# Patient Record
Sex: Male | Born: 1980 | Race: Black or African American | Hispanic: No | Marital: Married | Smoking: Current every day smoker
Health system: Southern US, Community
[De-identification: ages and names within clinical notes are randomized; demographics above are authoritative.]

## PROBLEM LIST (undated history)

## (undated) DIAGNOSIS — I10 Essential (primary) hypertension: Secondary | ICD-10-CM

## (undated) DIAGNOSIS — E119 Type 2 diabetes mellitus without complications: Secondary | ICD-10-CM

## (undated) DIAGNOSIS — K5792 Diverticulitis of intestine, part unspecified, without perforation or abscess without bleeding: Secondary | ICD-10-CM

## (undated) DIAGNOSIS — J40 Bronchitis, not specified as acute or chronic: Secondary | ICD-10-CM

---

## 2014-06-11 ENCOUNTER — Encounter (HOSPITAL_BASED_OUTPATIENT_CLINIC_OR_DEPARTMENT_OTHER): Payer: Self-pay | Admitting: *Deleted

## 2014-06-11 ENCOUNTER — Emergency Department (HOSPITAL_BASED_OUTPATIENT_CLINIC_OR_DEPARTMENT_OTHER)
Admission: EM | Admit: 2014-06-11 | Discharge: 2014-06-11 | Disposition: A | Discharge status: Home / Self Care | Payer: BLUE CROSS/BLUE SHIELD | Attending: Emergency Medicine | Admitting: Emergency Medicine

## 2014-06-11 DIAGNOSIS — M79675 Pain in left toe(s): Secondary | ICD-10-CM | POA: Diagnosis not present

## 2014-06-11 DIAGNOSIS — B070 Plantar wart: Secondary | ICD-10-CM | POA: Insufficient documentation

## 2014-06-11 DIAGNOSIS — Z720 Tobacco use: Secondary | ICD-10-CM | POA: Diagnosis not present

## 2014-06-11 NOTE — ED Notes (Signed)
C/o pain to left smallest toe. Toe has calloused area on inner side. Pt states he does a lot of walking at work.

## 2014-06-11 NOTE — Discharge Instructions (Signed)
As discussed, your evaluation today has been largely reassuring.  But, it is important that you monitor your condition carefully, and do not hesitate to return to the ED if you develop new, or concerning changes in your condition.  As discussed, please obtain either a callus pad or cotton padding device to separate the toes, during the next few days at work.  Otherwise, please follow-up with a podiatrist for appropriate ongoing care.

## 2014-06-11 NOTE — ED Provider Notes (Signed)
CSN: [id number]     Arrival date & time 06/11/14  1035 History   First MD Initiated Contact with Patient 06/11/14 1107     Chief Complaint  Patient presents with  . Toe Pain     (Consider location/radiation/quality/duration/timing/severity/associated sxs/prior Treatment) HPI Patient presents with concern of left small toe pain.  Pain has been present for about 1 week, seemingly worse while working. Patient walks for approximately 12 hours during his work day. He notes that he now has a tender area on the medial aspect of his left small toe. No loss of sensation or strength in the foot, nor any other focal changes from baseline.  History reviewed. No pertinent past medical history. History reviewed. No pertinent past surgical history. No family history on file. History  Substance Use Topics  . Smoking status: Current Every Day Smoker -- 0.30 packs/day    Types: Cigarettes  . Smokeless tobacco: Not on file  . Alcohol Use: Not on file    Review of Systems  Constitutional: Negative for fever and chills.  Skin: Positive for color change.  Neurological: Negative for weakness.      Allergies  Review of patient's allergies indicates no known allergies.  Home Medications   Prior to Admission medications   Not on File   BP 139/82 mmHg  Pulse 72  Temp(Src) 98 F (36.7 C) (Oral)  Resp 20  Ht 6\' 2"  (1.88 m)  Wt 290 lb (131.543 kg)  BMI 37.22 kg/m2  SpO2 97% Physical Exam  Constitutional: He appears well-nourished. No distress.  HENT:  Head: Atraumatic.  Cardiovascular: Normal rate, regular rhythm and intact distal pulses.   Musculoskeletal:       Feet:  Skin: He is not diaphoretic.  Nursing note and vitals reviewed.   ED Course  Procedures (including critical care time)   MDM   Final diagnoses:  Pain of toe of left foot  patient presents with foot pain, and has a sclerotic area on the medial aspect of his left foot. No history of trauma, no history of gout,  no history of falling. Physical exam suggests callus versus plantar wart. Patient is appropriate for further evaluation, management as an outpatient.    Gerhard Munchobert , MD 06/11/14 [phone]

## 2015-10-20 DIAGNOSIS — K5792 Diverticulitis of intestine, part unspecified, without perforation or abscess without bleeding: Secondary | ICD-10-CM

## 2015-10-20 HISTORY — DX: Diverticulitis of intestine, part unspecified, without perforation or abscess without bleeding: K57.92

## 2015-12-16 ENCOUNTER — Emergency Department (HOSPITAL_BASED_OUTPATIENT_CLINIC_OR_DEPARTMENT_OTHER)
Admission: EM | Admit: 2015-12-16 | Discharge: 2015-12-16 | Disposition: A | Discharge status: Home / Self Care | Payer: BLUE CROSS/BLUE SHIELD | Attending: Emergency Medicine | Admitting: Emergency Medicine

## 2015-12-16 ENCOUNTER — Emergency Department (HOSPITAL_BASED_OUTPATIENT_CLINIC_OR_DEPARTMENT_OTHER): Payer: BLUE CROSS/BLUE SHIELD

## 2015-12-16 ENCOUNTER — Encounter (HOSPITAL_BASED_OUTPATIENT_CLINIC_OR_DEPARTMENT_OTHER): Payer: Self-pay | Admitting: *Deleted

## 2015-12-16 DIAGNOSIS — M25562 Pain in left knee: Secondary | ICD-10-CM | POA: Diagnosis present

## 2015-12-16 DIAGNOSIS — F17210 Nicotine dependence, cigarettes, uncomplicated: Secondary | ICD-10-CM | POA: Diagnosis not present

## 2015-12-16 HISTORY — DX: Diverticulitis of intestine, part unspecified, without perforation or abscess without bleeding: K57.92

## 2015-12-16 MED ORDER — IBUPROFEN 400 MG PO TABS
600.0000 mg | ORAL_TABLET | Freq: Once | ORAL | Status: AC
  Administered 2015-12-16: 600 mg via ORAL
  Filled 2015-12-16: qty 1

## 2015-12-16 MED ORDER — OXYCODONE-ACETAMINOPHEN 5-325 MG PO TABS
1.0000 | ORAL_TABLET | Freq: Once | ORAL | Status: DC
Start: 2015-12-16 — End: 2015-12-16

## 2015-12-16 MED ORDER — IBUPROFEN 600 MG PO TABS: 600.0000 mg | ORAL_TABLET | Freq: Three times a day (TID) | ORAL | 0 refills | Status: DC | PRN

## 2015-12-16 MED ORDER — OXYCODONE-ACETAMINOPHEN 5-325 MG PO TABS
2.0000 | ORAL_TABLET | Freq: Once | ORAL | Status: DC
  Filled 2015-12-16: qty 2

## 2015-12-16 MED ORDER — OXYCODONE-ACETAMINOPHEN 5-325 MG PO TABS
1.0000 | ORAL_TABLET | Freq: Once | ORAL | Status: AC
  Administered 2015-12-16: 1 via ORAL

## 2015-12-16 NOTE — ED Triage Notes (Signed)
C/o L knee pain (midline and medial), onset at ~2030, no known injury, no meds PTA, no relief with elevation ice or rubbing with alcohol. Rates 8/10. Denies h/o same. Mentions was coming down the stairs today at work and stumbled. [name]. No ortho MD. Recent diverticulitis, finished doxy and flagyl ~ 2 weeks ago. (mentions some swelling. (denies: numbness/ tingling, nvd, fever or other sx), CMS intact, ROM limited d/t pain, swelling noted, no obvious heat. Pt making himself a tunafish sandwich at this time. VSS. Alert, NAD, calm, guarding knee, and movements. Able to bare wt and self transfer from w/c to stretcher.

## 2015-12-16 NOTE — ED Provider Notes (Signed)
[facility]   CSN: [id number] Arrival date & time: 12/16/15  0134     History   Chief Complaint Chief Complaint  Patient presents with  . Knee Pain    HPI Hooper Mario [name] is a 35 y.o. male. Patient presents to the emergency department with complaints of pain that began in his right knee.  Reports his pain began today.  She's not tried any medications prior to arrival.  He currently rates his pain is 8 out of 10.  His) knee pain like this before.  No history of gout.  No injury or trauma.  He is not remember any specific injury but he was at work when he began having worsening knee pain.  Denies fevers and chills.   The history is provided by the patient.    Past Medical History:  Diagnosis Date  . Diverticulitis 10/2015    There are no active problems to display for this patient.   History reviewed. No pertinent surgical history.     Home Medications    Prior to Admission medications   Medication Sig Start Date End Date Taking? Authorizing Provider  ibuprofen (ADVIL,MOTRIN) 600 MG tablet Take 1 tablet (600 mg total) by mouth every 8 (eight) hours as needed. 12/16/15   Azalia Bilis, MD    Family History History reviewed. No pertinent family history.  Social History Social History  Substance Use Topics  . Smoking status: Current Every Day Smoker    Packs/day: 0.30    Types: Cigarettes  . Smokeless tobacco: Never Used  . Alcohol use Yes     Comment: occ     Allergies   Review of patient's allergies indicates no known allergies.   Review of Systems Review of Systems  All other systems reviewed and are negative.    Physical Exam Updated Vital Signs BP 145/91 (BP Location: Right Arm)   Pulse 81   Temp 98.4 F (36.9 C) (Oral)   Resp 20   Ht 6\' 1"  (1.854 m)   Wt 279 lb (126.6 kg)   SpO2 97%   BMI 36.81 kg/m   Physical Exam  Constitutional: He is oriented to person, place, and time. He appears well-developed and  well-nourished.  HENT:  Head: Normocephalic.  Eyes: EOM are normal.  Neck: Normal range of motion.  Pulmonary/Chest: Effort normal.  Abdominal: He exhibits no distension.  Musculoskeletal:  Mild generalized tenderness of the left knee without significant swelling.  No erythema noted.  Normal pulses in left foot.  Full range of motion of left knee and left ankle as well as left hip.  No swelling of left lower extremity as compared to the right.  Neurological: He is alert and oriented to person, place, and time.  Psychiatric: He has a normal mood and affect.  Nursing note and vitals reviewed.    ED Treatments / Results  Labs (all labs ordered are listed, but only abnormal results are displayed) Labs Reviewed - No data to display  EKG  EKG Interpretation None       Radiology Dg Knee Complete 4 Views Left  Result Date: 12/16/2015 CLINICAL DATA:  Medial and midline left knee pain. No known injury. Stumbled on stairs today. EXAM: LEFT KNEE - COMPLETE 4+ VIEW COMPARISON:  None. FINDINGS: No evidence of fracture, dislocation, or joint effusion. No evidence of arthropathy or other focal bone abnormality. Soft tissues are unremarkable. IMPRESSION: Negative. Electronically Signed   By: Burman Nieves M.D.   On: 12/16/2015 02:23  Procedures Procedures (including critical care time)  Medications Ordered in ED Medications  ibuprofen (ADVIL,MOTRIN) tablet 600 mg (600 mg Oral Given 12/16/15 0243)  oxyCODONE-acetaminophen (PERCOCET/ROXICET) 5-325 MG per tablet 1 tablet (1 tablet Oral Given 12/16/15 0243)     Initial Impression / Assessment and Plan / ED Course  I have reviewed the triage vital signs and the nursing notes.  Pertinent labs & imaging results that were available during my care of the patient were reviewed by me and considered in my medical decision making (see chart for details).  Clinical Course    Plain films without acute pathology.  No evidence of significant  arthritis.  This could represent an inflammatory arthritis.  Doubt septic arthritis.  Well-appearing.  Ambulatory in the emergency department.  Home with ibuprofen.  Outpatient sports medicine follow-up with his knee pain continues.  Final Clinical Impressions(s) / ED Diagnoses   Final diagnoses:  Left knee pain    New Prescriptions New Prescriptions   IBUPROFEN (ADVIL,MOTRIN) 600 MG TABLET    Take 1 tablet (600 mg total) by mouth every 8 (eight) hours as needed.     Azalia BilisKevin , MD 12/16/15 [phone]

## 2019-01-09 ENCOUNTER — Encounter (HOSPITAL_BASED_OUTPATIENT_CLINIC_OR_DEPARTMENT_OTHER): Payer: Self-pay | Admitting: *Deleted

## 2019-01-09 ENCOUNTER — Emergency Department (HOSPITAL_BASED_OUTPATIENT_CLINIC_OR_DEPARTMENT_OTHER): Payer: BC Managed Care – PPO

## 2019-01-09 ENCOUNTER — Other Ambulatory Visit: Payer: Self-pay

## 2019-01-09 ENCOUNTER — Emergency Department (HOSPITAL_BASED_OUTPATIENT_CLINIC_OR_DEPARTMENT_OTHER)
Admission: EM | Admit: 2019-01-09 | Discharge: 2019-01-09 | Disposition: A | Discharge status: Home / Self Care | Payer: BC Managed Care – PPO | Attending: Emergency Medicine | Admitting: Emergency Medicine

## 2019-01-09 DIAGNOSIS — M899 Disorder of bone, unspecified: Secondary | ICD-10-CM | POA: Insufficient documentation

## 2019-01-09 DIAGNOSIS — F17210 Nicotine dependence, cigarettes, uncomplicated: Secondary | ICD-10-CM | POA: Diagnosis not present

## 2019-01-09 DIAGNOSIS — M79672 Pain in left foot: Secondary | ICD-10-CM

## 2019-01-09 MED ORDER — IBUPROFEN 400 MG PO TABS
600.0000 mg | ORAL_TABLET | Freq: Once | ORAL | Status: AC
  Administered 2019-01-09: 600 mg via ORAL
  Filled 2019-01-09: qty 1

## 2019-01-09 NOTE — Discharge Instructions (Addendum)
It was our pleasure to provide your ER care today - we hope that you feel better.  Take ibuprofen or aleve as need for pain.   For bone lesion noted on xrays, and for your foot pain, follow up with orthopedist in the next 1-2 weeks - call office tomorrow to arrange appointment.   Also follow up with primary care doctor in the next few weeks for recheck of blood pressure, as it is high tonight.   Return to ER if worse, new symptoms, fevers, increased swelling/redness of foot, severe or intractable pain, or other concern.

## 2019-01-09 NOTE — ED Provider Notes (Signed)
[facility] EMERGENCY DEPARTMENT Provider Note   CSN: [id number] Arrival date & time: 01/09/19  2001     History   Chief Complaint Chief Complaint  Patient presents with  . Foot Pain    HPI Mario [name] is a 38 y.o. male.     Patient c/o left foot pain for the past few days. Symptoms acute onset, moderate, persistent, non radiating, worse w wt bearing/walking. Initially was located on dorsal aspect of midfoot, but now also just anterior to left heel. Family member notes similar episodes of pain in that foot on a recurrent basis 'for a long time'. Pt denies specific injury. No foot numbness/weakness. No skin lesions or erythema. No hx arthritis or gout. No leg swelling, no calf swelling or pain. No fever or chills.   The history is provided by the patient.  Foot Pain    Past Medical History:  Diagnosis Date  . Diverticulitis 10/2015    There are no active problems to display for this patient.   History reviewed. No pertinent surgical history.      Home Medications    Prior to Admission medications   Medication Sig Start Date End Date Taking? Authorizing Provider  ibuprofen (ADVIL,MOTRIN) 600 MG tablet Take 1 tablet (600 mg total) by mouth every 8 (eight) hours as needed. 12/16/15   Jola Schmidt, MD    Family History No family history on file.  Social History Social History   Tobacco Use  . Smoking status: Current Every Day Smoker    Packs/day: 0.30    Types: Cigarettes  . Smokeless tobacco: Never Used  Substance Use Topics  . Alcohol use: Yes    Comment: occ  . Drug use: No     Allergies   Patient has no known allergies.   Review of Systems Review of Systems  Constitutional: Negative for chills and fever.  Cardiovascular: Negative for leg swelling.  Musculoskeletal:       Left foot pain  Skin: Negative for rash and wound.  Neurological: Negative for numbness.     Physical Exam Updated Vital Signs BP (!) 136/99 (BP Location:  Right Arm)   Pulse 80   Temp 99 F (37.2 C) (Oral)   Resp 18   Ht 1.88 m (6\' 2" )   Wt (!) 138.3 kg   SpO2 100%   BMI 39.16 kg/m   Physical Exam Vitals signs and nursing note reviewed.  Constitutional:      Appearance: Normal appearance. He is well-developed.  HENT:     Head: Atraumatic.     Nose: Nose normal.     Mouth/Throat:     Mouth: Mucous membranes are moist.  Eyes:     General: No scleral icterus.    Conjunctiva/sclera: Conjunctivae normal.  Neck:     Musculoskeletal: Neck supple.     Trachea: No tracheal deviation.  Cardiovascular:     Rate and Rhythm: Normal rate.     Pulses: Normal pulses.  Pulmonary:     Effort: Pulmonary effort is normal. No accessory muscle usage or respiratory distress.  Genitourinary:    Comments: No cva tenderness. Musculoskeletal:        General: No swelling.     Comments: Tenderness dorsal aspect left midfoot, and plantar aspect anterior to left heel. Dp/pt 2+. Normal cap refill distally. Skin intact, no erythema or lesions. Foot is of normal color and warmth. Good passive rom at ankle without pain, ankle stable. No leg/calf swelling or pain.   Skin:  General: Skin is warm and dry.     Findings: No rash.  Neurological:     Mental Status: He is alert.     Comments: Alert, speech clear. Left foot, motor/sens grossly intact.   Psychiatric:        Mood and Affect: Mood normal.      ED Treatments / Results  Labs (all labs ordered are listed, but only abnormal results are displayed) Labs Reviewed - No data to display  EKG None  Radiology Dg Ankle Complete Left  Result Date: 01/09/2019 CLINICAL DATA:  Pain EXAM: LEFT ANKLE COMPLETE - 3+ VIEW COMPARISON:  None. FINDINGS: No fracture or dislocation. The ankle mortise appears congruent. 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 Enthesophyte at the syndesmosis. No large ankle joint effusion. Calcaneal enthesophytes seen. 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 IMPRESSION: No acute osseous abnormality. Electronically Signed   By: Jonna ClarkBindu  Avutu M.D.   On: 01/09/2019 22:10   Dg Foot Complete Left  Result Date: 01/09/2019 CLINICAL DATA:  Acute left foot pain. EXAM: LEFT FOOT - COMPLETE 3+ VIEW COMPARISON:  None. FINDINGS: No acute fracture or dislocation. Incompletely characterized lucent bone lesion within the distal tibia with thin sclerotic margins measuring approximately 4.5 x 3.8 cm, and appears to extend to the articular surface of the distal tibia. There is advanced joint space loss of the tibiotalar joint. Bidirectional calcaneal enthesophytes. No focal soft tissue swelling. IMPRESSION: 1. No acute osseous abnormality of the left foot. 2. 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 Further evaluation with dedicated radiographs of the left ankle is recommended. Electronically Signed   By: Duanne GuessNicholas  Plundo M.D.   On: 01/09/2019 20:59    Procedures Procedures (including critical care time)  Medications Ordered in ED Medications  ibuprofen (ADVIL) tablet 600 mg (has no administration in time range)     Initial Impression / Assessment and Plan / ED Course  I have reviewed the triage vital signs and the nursing notes.  Pertinent labs & imaging results that were available during my care of the patient were reviewed by me and considered in my medical decision making (see chart for details).  Xrays, motrin po.  Reviewed nursing notes and prior charts for additional history.   Xrays reviewed by me - no fx. Note by by radiology of possible distal tibia/articular bone cyst/lesion, and rec ankle xrays - discussed w pt, and ankle films added.   Will have f/u with orthopedics as outpt.   Discussed plan for nsaid, and ortho f/u.   Return  precautions provided.     Final Clinical Impressions(s) / ED Diagnoses   Final diagnoses:  None    ED Discharge Orders    None       Cathren LaineSteinl, , MD 01/09/19 2214

## 2019-01-09 NOTE — ED Triage Notes (Signed)
Pt reports pain in top of right foot since yesterday. States pain is moving back toward his ankle. Denies injury

## 2019-01-09 NOTE — ED Notes (Signed)
ED Provider at bedside. 

## 2019-05-28 ENCOUNTER — Emergency Department (HOSPITAL_BASED_OUTPATIENT_CLINIC_OR_DEPARTMENT_OTHER): Payer: BC Managed Care – PPO

## 2019-05-28 ENCOUNTER — Encounter (HOSPITAL_BASED_OUTPATIENT_CLINIC_OR_DEPARTMENT_OTHER): Payer: Self-pay | Admitting: *Deleted

## 2019-05-28 ENCOUNTER — Emergency Department (HOSPITAL_BASED_OUTPATIENT_CLINIC_OR_DEPARTMENT_OTHER)
Admission: EM | Admit: 2019-05-28 | Discharge: 2019-05-29 | Disposition: A | Discharge status: Home / Self Care | Payer: BC Managed Care – PPO | Attending: Emergency Medicine | Admitting: Emergency Medicine

## 2019-05-28 ENCOUNTER — Other Ambulatory Visit: Payer: Self-pay

## 2019-05-28 DIAGNOSIS — R1013 Epigastric pain: Secondary | ICD-10-CM | POA: Insufficient documentation

## 2019-05-28 DIAGNOSIS — B349 Viral infection, unspecified: Secondary | ICD-10-CM

## 2019-05-28 DIAGNOSIS — R519 Headache, unspecified: Secondary | ICD-10-CM | POA: Diagnosis not present

## 2019-05-28 DIAGNOSIS — F17210 Nicotine dependence, cigarettes, uncomplicated: Secondary | ICD-10-CM | POA: Insufficient documentation

## 2019-05-28 DIAGNOSIS — Z20822 Contact with and (suspected) exposure to covid-19: Secondary | ICD-10-CM | POA: Diagnosis not present

## 2019-05-28 LAB — COMPREHENSIVE METABOLIC PANEL
ALT: 35 U/L (ref 0–44)
AST: 29 U/L (ref 15–41)
Albumin: 4 g/dL (ref 3.5–5.0)
Alkaline Phosphatase: 84 U/L (ref 38–126)
Anion gap: 9 (ref 5–15)
BUN: <5 mg/dL — ABNORMAL LOW (ref 6–20)
CO2: 27 mmol/L (ref 22–32)
Calcium: 9.2 mg/dL (ref 8.9–10.3)
Chloride: 101 mmol/L (ref 98–111)
Creatinine, Ser: 1.05 mg/dL (ref 0.61–1.24)
GFR calc Af Amer: >60 mL/min (ref >60)
GFR calc non Af Amer: >60 mL/min (ref >60)
Glucose, Bld: 116 mg/dL — ABNORMAL HIGH (ref 70–99)
Potassium: 4.1 mmol/L (ref 3.5–5.1)
Sodium: 137 mmol/L (ref 135–145)
Total Bilirubin: 1 mg/dL (ref 0.3–1.2)
Total Protein: 7.9 g/dL (ref 6.5–8.1)

## 2019-05-28 LAB — CBC WITH DIFFERENTIAL/PLATELET
Abs Immature Granulocytes: 0.04 10*3/uL (ref 0.00–0.07)
Basophils Absolute: 0.1 10*3/uL (ref 0.0–0.1)
Basophils Relative: 1 %
Eosinophils Absolute: 0.1 10*3/uL (ref 0.0–0.5)
Eosinophils Relative: 1 %
HCT: 47.6 % (ref 39.0–52.0)
Hemoglobin: 16.1 g/dL (ref 13.0–17.0)
Immature Granulocytes: 0 %
Lymphocytes Relative: 28 %
Lymphs Abs: 2.8 10*3/uL (ref 0.7–4.0)
MCH: 30.8 pg (ref 26.0–34.0)
MCHC: 33.8 g/dL (ref 30.0–36.0)
MCV: 91 fL (ref 80.0–100.0)
Monocytes Absolute: 0.8 10*3/uL (ref 0.1–1.0)
Monocytes Relative: 8 %
Neutro Abs: 6.1 10*3/uL (ref 1.7–7.7)
Neutrophils Relative %: 62 %
Platelets: 323 10*3/uL (ref 150–400)
RBC: 5.23 MIL/uL (ref 4.22–5.81)
RDW: 14.6 % (ref 11.5–15.5)
WBC: 9.7 10*3/uL (ref 4.0–10.5)
nRBC: 0 % (ref 0.0–0.2)

## 2019-05-28 LAB — TROPONIN I (HIGH SENSITIVITY)
Troponin I (High Sensitivity): 11 ng/L (ref <18)
Troponin I (High Sensitivity): 11 ng/L (ref <18)

## 2019-05-28 LAB — LIPASE, BLOOD: Lipase: 21 U/L (ref 11–51)

## 2019-05-28 LAB — URINALYSIS, MICROSCOPIC (REFLEX)

## 2019-05-28 LAB — RAPID URINE DRUG SCREEN, HOSP PERFORMED
Amphetamines: NOT DETECTED
Barbiturates: NOT DETECTED
Benzodiazepines: NOT DETECTED
Cocaine: POSITIVE — AB
Opiates: NOT DETECTED
Tetrahydrocannabinol: NOT DETECTED

## 2019-05-28 LAB — URINALYSIS, ROUTINE W REFLEX MICROSCOPIC
Bilirubin Urine: NEGATIVE
Glucose, UA: NEGATIVE mg/dL
Ketones, ur: NEGATIVE mg/dL
Leukocytes,Ua: NEGATIVE
Nitrite: NEGATIVE
Protein, ur: NEGATIVE mg/dL
Specific Gravity, Urine: >1.03 — ABNORMAL HIGH (ref 1.005–1.030)
pH: 5.5 (ref 5.0–8.0)

## 2019-05-28 LAB — SARS CORONAVIRUS 2 AG (30 MIN TAT): SARS Coronavirus 2 Ag: NEGATIVE

## 2019-05-28 LAB — LACTIC ACID, PLASMA
Lactic Acid, Venous: 2.6 mmol/L (ref 0.5–1.9)
Lactic Acid, Venous: 2.9 mmol/L (ref 0.5–1.9)
Lactic Acid, Venous: 2.3 mmol/L (ref 0.5–1.9)

## 2019-05-28 MED ORDER — SODIUM CHLORIDE 0.9 % IV BOLUS
500.0000 mL | Freq: Once | INTRAVENOUS | Status: AC
  Administered 2019-05-28: 500 mL via INTRAVENOUS

## 2019-05-28 MED ORDER — SODIUM CHLORIDE 0.9 % IV BOLUS
1000.0000 mL | Freq: Once | INTRAVENOUS | Status: AC
  Administered 2019-05-28: 1000 mL via INTRAVENOUS

## 2019-05-28 MED ORDER — SODIUM CHLORIDE 0.9 % IV BOLUS: 500.0000 mL | Freq: Once | INTRAVENOUS | Status: DC

## 2019-05-28 MED ORDER — ONDANSETRON HCL 4 MG/2ML IJ SOLN
4.0000 mg | Freq: Once | INTRAMUSCULAR | Status: AC
  Administered 2019-05-28: 4 mg via INTRAVENOUS
  Filled 2019-05-28: qty 2

## 2019-05-28 NOTE — ED Notes (Signed)
Lab called a lactic acid of 2.6. Dr. Fredderick Phenix is aware of this result.

## 2019-05-28 NOTE — ED Provider Notes (Signed)
[facility] EMERGENCY DEPARTMENT Provider Note   CSN: [id number] Arrival date & time: 05/28/19  7408     History Chief Complaint  Patient presents with  . Headache  . Abdominal Pain    Mario [name] is a 39 y.o. male.  Patient is a 39 year old male who presents with chest and abdominal pain.  He states that he has been feeling weak since this morning.  Around noon he started having epigastric pain that radiated to his chest.  He has been having some coughing and nasal congestion.  He has some associated shortness of breath.  He had one episode of vomiting.  He feels like "he's going in and out".  He says he feels weak all over.  He also complains of a headache that is on the top of his head and toward the back.  He said that started about the same time as the abdominal and chest pain.  It started out mild and got worse over the day.  No known fevers.  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  He smokes marijuana but denies any other drug use.  He denies any alcohol use.        No past medical history on file.  There are no problems to display for this patient.   History reviewed. No pertinent surgical history.     No family history on file.  Social History   Tobacco Use  . Smoking status: Current Every Day Smoker  . Smokeless tobacco: Never Used  Substance Use Topics  . Alcohol use: Yes  . Drug use: Yes    Types: Cocaine    Comment: states last used 2 weeks ago    Home Medications Prior to Admission medications   Not on File    Allergies    Patient has no known allergies.  Review of Systems   Review of Systems  Constitutional: Positive for fatigue. Negative for chills, diaphoresis and fever.  HENT: Positive for congestion. Negative for rhinorrhea and sneezing.   Eyes: Negative.   Respiratory: Positive for cough and shortness of breath. Negative for chest tightness.   Cardiovascular: Positive for  chest pain. Negative for leg swelling.  Gastrointestinal: Positive for abdominal pain, nausea and vomiting. Negative for blood in stool and diarrhea.  Genitourinary: Negative for difficulty urinating, flank pain, frequency and hematuria.  Musculoskeletal: Negative for arthralgias and back pain.  Skin: Negative for rash.  Neurological: Positive for weakness (Diffuse) and headaches. Negative for dizziness, speech difficulty and numbness.    Physical Exam Updated Vital Signs BP 130/77   Pulse 76   Temp 97.9 F (36.6 C) (Oral)   Resp 15   Ht 6\' 2"  (1.88 m)   Wt 131.5 kg   SpO2 97%   BMI 37.23 kg/m   Physical Exam Constitutional:      Appearance: He is well-developed.     Comments: Patient is sleepy but will wake up and answer questions  HENT:     Head: Normocephalic and atraumatic.  Eyes:     Pupils: Pupils are equal, round, and reactive to light.  Cardiovascular:     Rate and Rhythm: Normal rate and regular rhythm.     Heart sounds: Normal heart sounds.  Pulmonary:     Effort: Pulmonary effort is normal. No respiratory distress.     Breath sounds: Normal breath sounds. No wheezing or rales.  Chest:  Chest wall: No tenderness.  Abdominal:     General: Bowel sounds are normal.     Palpations: Abdomen is soft.     Tenderness: There is no abdominal tenderness. There is no guarding or rebound.  Musculoskeletal:        General: Normal range of motion.     Cervical back: Normal range of motion and neck supple.  Lymphadenopathy:     Cervical: No cervical adenopathy.  Skin:    General: Skin is warm and dry.     Findings: No rash.  Neurological:     Mental Status: He is oriented to person, place, and time.     Cranial Nerves: No cranial nerve deficit, dysarthria or facial asymmetry.     Sensory: Sensory deficit present.     Motor: No weakness (No focal weakness).     ED Results / Procedures / Treatments   Labs (all labs ordered are listed, but only abnormal results  are displayed) Labs Reviewed  COMPREHENSIVE METABOLIC PANEL - Abnormal; Notable for the following components:      Result Value   Glucose, Bld 116 (*)    BUN <5 (*)    All other components within normal limits  LACTIC ACID, PLASMA - Abnormal; Notable for the following components:   Lactic Acid, Venous 2.6 (*)    All other components within normal limits  LACTIC ACID, PLASMA - Abnormal; Notable for the following components:   Lactic Acid, Venous 2.9 (*)    All other components within normal limits  RAPID URINE DRUG SCREEN, HOSP PERFORMED - Abnormal; Notable for the following components:   Cocaine POSITIVE (*)    All other components within normal limits  URINALYSIS, ROUTINE W REFLEX MICROSCOPIC - Abnormal; Notable for the following components:   Specific Gravity, Urine >1.030 (*)    Hgb urine dipstick MODERATE (*)    All other components within normal limits  URINALYSIS, MICROSCOPIC (REFLEX) - Abnormal; Notable for the following components:   Bacteria, UA MANY (*)    All other components within normal limits  SARS CORONAVIRUS 2 AG (30 MIN TAT)  SARS CORONAVIRUS 2 (TAT 6-24 HRS)  CBC WITH DIFFERENTIAL/PLATELET  LIPASE, BLOOD  TROPONIN I (HIGH SENSITIVITY)  TROPONIN I (HIGH SENSITIVITY)    EKG EKG Interpretation  Date/Time:  Saturday May 28 2019 19:18:04 EST Ventricular Rate:  63 PR Interval:    QRS Duration: 91 QT Interval:  482 QTC Calculation: 494 R Axis:   48 Text Interpretation: Sinus rhythm Minimal ST elevation, anterior leads Borderline prolonged QT interval NO STEMI. No old tracing to compare Confirmed by Drema Pry [phone]) on 05/28/2019 11:12:10 PM   Radiology CT Head Wo Contrast  Result Date: 05/28/2019 CLINICAL DATA:  Headache EXAM: CT HEAD WITHOUT CONTRAST TECHNIQUE: Contiguous axial images were obtained from the base of the skull through the vertex without intravenous contrast. COMPARISON:  None. FINDINGS: Brain: No acute intracranial abnormality.  Specifically, no hemorrhage, hydrocephalus, mass lesion, acute infarction, or significant intracranial injury. Vascular: No hyperdense vessel or unexpected calcification. Skull: No acute calvarial abnormality. Sinuses/Orbits: Visualized paranasal sinuses and mastoids clear. Orbital soft tissues unremarkable. Other: None IMPRESSION: Normal study. Electronically Signed   By: Charlett Nose M.D.   On: 05/28/2019 20:20   DG Chest Port 1 View  Result Date: 05/28/2019 CLINICAL DATA:  Cough EXAM: PORTABLE CHEST 1 VIEW COMPARISON:  05/24/2019 FINDINGS: The heart size and mediastinal contours are within normal limits. Both lungs are clear. The visualized skeletal structures are unremarkable. IMPRESSION:  No acute abnormality of the lungs in AP portable projection. Electronically Signed   By: Lauralyn Primes M.D.   On: 05/28/2019 20:20    Procedures Procedures (including critical care time)  Medications Ordered in ED Medications  sodium chloride 0.9 % bolus 500 mL (0 mLs Intravenous Stopping Infusion hung by another clincian 05/28/19 2136)  ondansetron (ZOFRAN) injection 4 mg (4 mg Intravenous Given 05/28/19 2048)  sodium chloride 0.9 % bolus 1,000 mL (1,000 mLs Intravenous New Bag/Given 05/28/19 2218)    ED Course  I have reviewed the triage vital signs and the nursing notes.  Pertinent labs & imaging results that were available during my care of the patient were reviewed by me and considered in my medical decision making (see chart for details).    MDM Rules/Calculators/A&P                      Patient is a 39 year old male who presents with 2-day history of some cough and cold symptoms associated with some chest and abdominal pain that started today associated with a headache.  He has no meningeal symptoms.  On my exam he has no abdominal tenderness.  He says is chest and abdominal pain resolved after he had an episode of vomiting prior to arrival at the ED.  He was initially fairly sleepy on exam but is  completely back to baseline at this point.  His labs are nonconcerning other than he does have an elevation in his lactate.  His urine appears to be concentrated.  It could be related to some dehydration.  He does not have myalgias which would be more concerning for rhabdo.  He has no abdominal pain on reexam.  No chest pain.  His headache and neck pain have resolved.  He is currently in the process of getting some IV fluids and will recheck his lactate following this.  If improving can be discharged home with symptomatic care.  Dr. Eudelia Bunch to take over pending lactate. Final Clinical Impression(s) / ED Diagnoses Final diagnoses:  Nonintractable headache, unspecified chronicity pattern, unspecified headache type  Viral syndrome    Rx / DC Orders ED Discharge Orders    None       Rolan Bucco, MD 05/28/19 2318

## 2019-05-28 NOTE — ED Notes (Signed)
RN @bedside  for labs; imaging tbd after

## 2019-05-28 NOTE — ED Triage Notes (Addendum)
Pt c/o headache, abdominal pain, and "feeling weak" since this morning. Reports he vomited on the way to the ED. Points to epigastric for location of pain. States "I feel like I'm going to pass out". Pt taken to room 11 to complete triage

## 2019-05-28 NOTE — ED Notes (Signed)
ED Provider at bedside. 

## 2019-05-29 LAB — SARS CORONAVIRUS 2 (TAT 6-24 HRS): SARS Coronavirus 2: NEGATIVE

## 2019-05-29 NOTE — ED Provider Notes (Signed)
I assumed care of this patient from Dr. Gilmore Laroche.  Please see their note for further details of Hx, PE.  Briefly patient is a 39 y.o. male who presented chest pain, abd pain, and headache. Work up reassuring other than elevated lactic acid. Plan to recheck lactic after IVF.  Down trending. Pain resolved. Stable.   The patient appears reasonably screened and/or stabilized for discharge and I doubt any other medical condition or other [facility] requiring further screening, evaluation, or treatment in the ED at this time prior to discharge.  Disposition: Discharge  Condition: Good  I have discussed the results, Dx and Tx plan with the patient who expressed understanding and agree(s) with the plan. Discharge instructions discussed at great length. The patient was given strict return precautions who verbalized understanding of the instructions. No further questions at time of discharge.    ED Discharge Orders    None     Follow Up: Practice, [facility] [address] [place] [address] [phone]  Schedule an appointment as soon as possible for a visit            , Amadeo Garnet, MD 05/29/19 [phone]

## 2019-05-30 ENCOUNTER — Encounter (HOSPITAL_BASED_OUTPATIENT_CLINIC_OR_DEPARTMENT_OTHER): Payer: Self-pay | Admitting: *Deleted

## 2020-03-12 ENCOUNTER — Emergency Department (HOSPITAL_BASED_OUTPATIENT_CLINIC_OR_DEPARTMENT_OTHER)
Admission: EM | Admit: 2020-03-12 | Discharge: 2020-03-12 | Disposition: A | Discharge status: Home / Self Care | Payer: BC Managed Care – PPO | Attending: Emergency Medicine | Admitting: Emergency Medicine

## 2020-03-12 ENCOUNTER — Other Ambulatory Visit: Payer: Self-pay

## 2020-03-12 ENCOUNTER — Encounter (HOSPITAL_BASED_OUTPATIENT_CLINIC_OR_DEPARTMENT_OTHER): Payer: Self-pay

## 2020-03-12 ENCOUNTER — Emergency Department (HOSPITAL_BASED_OUTPATIENT_CLINIC_OR_DEPARTMENT_OTHER): Payer: BC Managed Care – PPO

## 2020-03-12 DIAGNOSIS — F17210 Nicotine dependence, cigarettes, uncomplicated: Secondary | ICD-10-CM | POA: Insufficient documentation

## 2020-03-12 DIAGNOSIS — M25562 Pain in left knee: Secondary | ICD-10-CM | POA: Diagnosis not present

## 2020-03-12 DIAGNOSIS — M62838 Other muscle spasm: Secondary | ICD-10-CM | POA: Diagnosis not present

## 2020-03-12 DIAGNOSIS — R519 Headache, unspecified: Secondary | ICD-10-CM | POA: Diagnosis not present

## 2020-03-12 DIAGNOSIS — Z79899 Other long term (current) drug therapy: Secondary | ICD-10-CM | POA: Diagnosis not present

## 2020-03-12 DIAGNOSIS — M542 Cervicalgia: Secondary | ICD-10-CM | POA: Diagnosis present

## 2020-03-12 MED ORDER — IBUPROFEN 800 MG PO TABS
800.0000 mg | ORAL_TABLET | Freq: Once | ORAL | Status: AC
  Administered 2020-03-12: 800 mg via ORAL
  Filled 2020-03-12: qty 1

## 2020-03-12 MED ORDER — METHOCARBAMOL 500 MG PO TABS: 500.0000 mg | ORAL_TABLET | Freq: Two times a day (BID) | ORAL | 0 refills | Status: DC

## 2020-03-12 MED ORDER — IBUPROFEN 600 MG PO TABS: 600.0000 mg | ORAL_TABLET | Freq: Four times a day (QID) | ORAL | 0 refills | Status: DC | PRN
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
## 2020-07-31 ENCOUNTER — Encounter (HOSPITAL_BASED_OUTPATIENT_CLINIC_OR_DEPARTMENT_OTHER): Payer: Self-pay | Admitting: *Deleted

## 2020-07-31 ENCOUNTER — Emergency Department (HOSPITAL_BASED_OUTPATIENT_CLINIC_OR_DEPARTMENT_OTHER)
Admission: EM | Admit: 2020-07-31 | Discharge: 2020-08-01 | Disposition: A | Discharge status: Home / Self Care | Payer: BC Managed Care – PPO | Attending: Emergency Medicine | Admitting: Emergency Medicine

## 2020-07-31 ENCOUNTER — Emergency Department (HOSPITAL_BASED_OUTPATIENT_CLINIC_OR_DEPARTMENT_OTHER): Payer: BC Managed Care – PPO

## 2020-07-31 ENCOUNTER — Other Ambulatory Visit: Payer: Self-pay

## 2020-07-31 DIAGNOSIS — M25572 Pain in left ankle and joints of left foot: Secondary | ICD-10-CM | POA: Insufficient documentation

## 2020-07-31 DIAGNOSIS — F17210 Nicotine dependence, cigarettes, uncomplicated: Secondary | ICD-10-CM | POA: Diagnosis not present

## 2020-07-31 MED ORDER — KETOROLAC TROMETHAMINE 60 MG/2ML IM SOLN
30.0000 mg | Freq: Once | INTRAMUSCULAR | Status: AC
  Administered 2020-07-31: 30 mg via INTRAMUSCULAR
  Filled 2020-07-31: qty 2

## 2020-07-31 MED ORDER — ACETAMINOPHEN 500 MG PO TABS
1000.0000 mg | ORAL_TABLET | Freq: Once | ORAL | Status: AC
  Administered 2020-07-31: 1000 mg via ORAL
  Filled 2020-07-31: qty 2

## 2020-07-31 NOTE — ED Triage Notes (Signed)
C/o left leg pain swelling and redness x 1 week

## 2020-07-31 NOTE — ED Provider Notes (Signed)
[facility] EMERGENCY DEPARTMENT Provider Note  CSN: [id number] Arrival date & time: 07/31/20 2302  Chief Complaint(s) Leg Pain  HPI Mario [name] is a 40 y.o. male   The history is provided by the patient.  Leg Pain Location:  Ankle and leg Time since incident:  1 week Injury: no   Leg location:  R lower leg Ankle location:  L ankle Pain details:    Quality:  Aching and throbbing   Severity:  Severe   Onset quality:  Gradual   Duration:  1 week   Timing:  Constant   Progression:  Worsening Chronicity:  New Relieved by:  Nothing Worsened by:  Bearing weight and activity Associated symptoms: swelling   Associated symptoms: no back pain, no decreased ROM, no fever, no itching, no muscle weakness and no numbness     Past Medical History Past Medical History:  Diagnosis Date  . Diverticulitis 10/2015   There are no problems to display for this patient.  Home Medication(s) Prior to Admission medications   Medication Sig Start Date End Date Taking? Authorizing Provider  chlorthalidone (HYGROTON) 25 MG tablet Take by mouth. 07/31/20  Yes [provider]  traMADol (ULTRAM) 50 MG tablet Take by mouth. 07/31/20 08/05/20 Yes [provider]  amLODipine-benazepril (LOTREL) 5-20 MG capsule Take 1 capsule by mouth daily. 01/11/20   [provider]  fenofibrate (TRICOR) 145 MG tablet Take by mouth. 01/10/20   [provider]  ibuprofen (ADVIL) 600 MG tablet Take 1 tablet (600 mg total) by mouth every 6 (six) hours as needed. 03/12/20   Sponseller, Eugene Gavia, PA-C  methocarbamol (ROBAXIN) 500 MG tablet Take 1 tablet (500 mg total) by mouth 2 (two) times daily. 03/12/20   Sponseller, Lupe Carney R, PA-C  nadolol (CORGARD) 20 MG tablet Take 20 mg by mouth daily. 07/19/20   [provider]                                                                                                                                    Past Surgical  History History reviewed. No pertinent surgical history. Family History No family history on file.  Social History Social History   Tobacco Use  . Smoking status: Current Every Day Smoker    Packs/day: 0.30    Types: Cigarettes  . Smokeless tobacco: Never Used  Vaping Use  . Vaping Use: Never used  Substance Use Topics  . Alcohol use: Yes    Comment: occ  . Drug use: Not Currently    Types: Cocaine   Allergies Patient has no known allergies.  Review of Systems Review of Systems  Constitutional: Negative for fever.  Musculoskeletal: Negative for back pain.  Skin: Negative for itching.   All other systems are reviewed and are negative for acute change except as noted in the HPI  Physical Exam Vital Signs  I have reviewed the triage vital signs BP Marland Kitchen)  153/98   Pulse (!) 103   Temp 99.4 F (37.4 C) (Oral)   Resp 18   Ht 6\' 1"  (1.854 m)   Wt 136.1 kg   SpO2 98%   BMI 39.58 kg/m   Physical Exam Vitals reviewed.  Constitutional:      General: Mario [name] is not in acute distress.    Appearance: Mario [name] is well-developed. Mario [name] is not diaphoretic.  HENT:     Head: Normocephalic and atraumatic.     Jaw: No trismus.     Right Ear: External ear normal.     Left Ear: External ear normal.     Nose: Nose normal.  Eyes:     General: No scleral icterus.    Conjunctiva/sclera: Conjunctivae normal.  Neck:     Trachea: Phonation normal.  Cardiovascular:     Rate and Rhythm: Normal rate and regular rhythm.  Pulmonary:     Effort: Pulmonary effort is normal. No respiratory distress.     Breath sounds: No stridor.  Abdominal:     General: There is no distension.  Musculoskeletal:        General: Normal range of motion.     Cervical back: Normal range of motion.     Left lower leg: Swelling and tenderness present.     Right ankle: Normal pulse.     Left ankle: Swelling present. Tenderness present. Normal range of motion. Normal pulse.       Legs:  Neurological:     Mental  Status: Mario [name] is alert and oriented to person, place, and time.  Psychiatric:        Behavior: Behavior normal.     ED Results and Treatments Labs (all labs ordered are listed, but only abnormal results are displayed) Labs Reviewed - No data to display                                                                                                                       EKG  EKG Interpretation  Date/Time:    Ventricular Rate:    PR Interval:    QRS Duration:   QT Interval:    QTC Calculation:   R Axis:     Text Interpretation:        Radiology DG Ankle Complete Left  Result Date: 08/01/2020 CLINICAL DATA:  Left leg pain and swelling x1 week. EXAM: LEFT ANKLE COMPLETE - 3+ VIEW COMPARISON:  None. FINDINGS: There is no evidence of acute fracture, dislocation, or joint effusion. A small plantar calcaneal spur is seen. Chronic changes are seen involving the cortex of the lateral aspect of the distal left tibia and adjacent portion of the distal left fibula. Additional chronic changes are seen involving the left medial malleolus. Mild diffuse ankle soft tissue swelling is seen with mild soft tissue swelling also noted along the dorsal aspect of the proximal left foot. IMPRESSION: Mild soft tissue swelling and chronic changes, without acute osseous abnormality. Electronically Signed   By: 08/03/2020.D.  On: 08/01/2020 00:42   US Venous Img Lower Unilateral Left (DVT)  Result Date: 08/01/2020 CLINICAL DATA:  Left leg swelling EXAM: LEFT LOWER EXTREMITY VENOUS DOPPLER ULTRASOUND TECHNIQUE: Gray-scale sonography with compression, as well as color and duplex ultrasound, were performed to evaluate the deep venous system(s) from the level of the common femoral vein through the popliteal and proximal calf veins. COMPARISON:  None. FINDINGS: VENOUS Normal compressibility of the common femoral, superficial femoral, and popliteal veins, as well as the visualized calf veins. Visualized portions of  profunda femoral vein and great saphenous vein unremarkable. No filling defects to suggest DVT on grayscale or color Doppler imaging. Doppler waveforms show normal direction of venous flow, normal respiratory plasticity and response to augmentation. Limited views of the contralateral common femoral vein are unremarkable. OTHER None. Limitations: none IMPRESSION: Negative. Electronically Signed   By: Helyn Numbers MD   On: 08/01/2020 01:24    Pertinent labs & imaging results that were available during my care of the patient were reviewed by me and considered in my medical decision making (see chart for details).  Medications Ordered in ED Medications  ketorolac (TORADOL) injection 30 mg (30 mg Intramuscular Given 07/31/20 2356)  acetaminophen (TYLENOL) tablet 1,000 mg (1,000 mg Oral Given 07/31/20 2357)                                                                                                                                    Procedures Procedures  (including critical care time)  Medical Decision Making / ED Course I have reviewed the nursing notes for this encounter and the patient's prior records (if available in EHR or on provided paperwork).   Eligha Zaino was evaluated in Emergency Department on 08/01/2020 for the symptoms described in the history of present illness. Mario [name] was evaluated in the context of the global COVID-19 pandemic, which necessitated consideration that the patient might be at risk for infection with the SARS-CoV-2 virus that causes COVID-19. Institutional protocols and algorithms that pertain to the evaluation of patients at risk for COVID-19 are in a state of rapid change based on information released by regulatory bodies including the CDC and federal and state organizations. These policies and algorithms were followed during the patient's care in the ED.  Plain film with chronic arthritic changes.  Ultrasound negative for DVT. Possible sprain versus new  gout. Patient scheduled to see his primary care provider later on in the morning for labs. Already has prescription for tramadol. Pain improved with Toradol here.      Final Clinical Impression(s) / ED Diagnoses Final diagnoses:  Ankle pain, left   The patient appears reasonably screened and/or stabilized for discharge and I doubt any other medical condition or other [facility] requiring further screening, evaluation, or treatment in the ED at this time prior to discharge. Safe for discharge with strict return precautions.  Disposition: Discharge  Condition: Good  I have discussed the  results, Dx and Tx plan with the patient/family who expressed understanding and agree(s) with the plan. Discharge instructions discussed at length. The patient/family was given strict return precautions who verbalized understanding of the instructions. No further questions at time of discharge.    ED Discharge Orders    None       Follow Up: Practice, [facility] [address] [place] [address] [phone]  Go to  as scheduled      This chart was dictated using voice recognition software.  Despite best efforts to proofread,  errors can occur which can change the documentation meaning.   Nira Connardama,  Eduardo, MD 08/01/20 [phone]

## 2020-08-01 ENCOUNTER — Emergency Department (HOSPITAL_BASED_OUTPATIENT_CLINIC_OR_DEPARTMENT_OTHER): Payer: BC Managed Care – PPO

## 2020-08-01 NOTE — ED Notes (Signed)
Back from [employer]

## 2021-07-04 ENCOUNTER — Emergency Department (HOSPITAL_COMMUNITY)
Admission: EM | Admit: 2021-07-04 | Discharge: 2021-07-04 | Disposition: A | Discharge status: Home / Self Care | Payer: BC Managed Care – PPO | Attending: Emergency Medicine | Admitting: Emergency Medicine

## 2021-07-04 ENCOUNTER — Encounter (HOSPITAL_COMMUNITY): Payer: Self-pay | Admitting: Emergency Medicine

## 2021-07-04 DIAGNOSIS — M62838 Other muscle spasm: Secondary | ICD-10-CM | POA: Diagnosis not present

## 2021-07-04 DIAGNOSIS — M542 Cervicalgia: Secondary | ICD-10-CM | POA: Diagnosis present

## 2021-07-04 MED ORDER — METHOCARBAMOL 500 MG PO TABS
500.0000 mg | ORAL_TABLET | Freq: Once | ORAL | Status: AC
  Administered 2021-07-04: 500 mg via ORAL
  Filled 2021-07-04: qty 1

## 2021-07-04 MED ORDER — METHOCARBAMOL 500 MG PO TABS: 500.0000 mg | ORAL_TABLET | Freq: Two times a day (BID) | ORAL | 0 refills | Status: DC

## 2021-07-04 MED ORDER — IBUPROFEN 400 MG PO TABS
600.0000 mg | ORAL_TABLET | Freq: Once | ORAL | Status: AC
  Administered 2021-07-04: 600 mg via ORAL
  Filled 2021-07-04: qty 1

## 2021-07-04 NOTE — Discharge Instructions (Addendum)
It was a pleasure taking care of you today! ? ?You will be sent a prescription for Robaxin, take as directed.  Do not drive or operate heavy machinery with this medication as it can make you drowsy.  You may take over-the-counter 600 mg ibuprofen every 6 hours or 500 mg Tylenol every 6 hours as needed for pain for no more than 7 days.  You may apply a warm compress to your affected areas for up to 15 minutes at a time.  Ensure to place a barrier between your skin and the warm compress.  You may follow-up with your primary care provider as needed.  Return to the emergency department for experiencing increasing/worsening inability to move your neck, color change, swelling, worsening symptoms. ?

## 2021-07-04 NOTE — ED Provider Notes (Signed)
?[facility] ?Provider Note ? ? ?CSN: [id number] ?Arrival date & time: 07/04/21  1110 ? ?  ? ?History ? ?Chief Complaint  ?Patient presents with  ? Neck Pain  ? ? ?Mario [name] is a 41 y.o. male  who presents to the emergency department complaining of right-sided neck pain onset 4 days ago.  Denies injury, trauma, fall, heavy lifting.  Has tried ibuprofen and warm compresses and Biofreeze with no relief of his symptoms.  Denies headache, vision changes.  ? ?The history is provided by the patient. No language interpreter was used.  ? ?  ? ?Home Medications ?Prior to Admission medications   ?Medication Sig Start Date End Date Taking? Authorizing Provider  ?amLODipine-benazepril (LOTREL) 5-20 MG capsule Take 1 capsule by mouth daily. 01/11/20   [provider]  ?chlorthalidone (HYGROTON) 25 MG tablet Take by mouth. 07/31/20   [provider]  ?fenofibrate (TRICOR) 145 MG tablet Take by mouth. 01/10/20   [provider]  ?ibuprofen (ADVIL) 600 MG tablet Take 1 tablet (600 mg total) by mouth every 6 (six) hours as needed. 03/12/20   Sponseller, Gypsy Balsam, PA-C  ?methocarbamol (ROBAXIN) 500 MG tablet Take 1 tablet (500 mg total) by mouth 2 (two) times daily. 07/04/21   ,  A, PA-C  ?nadolol (CORGARD) 20 MG tablet Take 20 mg by mouth daily. 07/19/20   [provider]  ?   ? ?Allergies    ?Patient has no known allergies.   ? ?Review of Systems   ?Review of Systems  ?Musculoskeletal:  Positive for neck pain.  ? ?Physical Exam ?Updated Vital Signs ?BP (!) 164/111   Pulse 79   Temp 98.6 ?F (37 ?C)   Resp 16   SpO2 97%  ?Physical Exam ?Vitals and nursing note reviewed.  ?Constitutional:   ?   General: He is not in acute distress. ?   Appearance: He is not diaphoretic.  ?HENT:  ?   Head: Normocephalic and atraumatic.  ?   Mouth/Throat:  ?   Pharynx: No oropharyngeal exudate.  ?Eyes:  ?   General: No scleral icterus. ?   Conjunctiva/sclera:  Conjunctivae normal.  ?Cardiovascular:  ?   Rate and Rhythm: Normal rate and regular rhythm.  ?   Pulses: Normal pulses.  ?   Heart sounds: Normal heart sounds.  ?Pulmonary:  ?   Effort: Pulmonary effort is normal. No respiratory distress.  ?   Breath sounds: Normal breath sounds. No wheezing.  ?Abdominal:  ?   General: Bowel sounds are normal.  ?   Palpations: Abdomen is soft. There is no mass.  ?   Tenderness: There is no abdominal tenderness. There is no guarding or rebound.  ?Musculoskeletal:     ?   General: Normal range of motion.  ?   Cervical back: Normal range of motion and neck supple.  ?   Comments: No C, T, L, S spinal tenderness to palpation.  No tenderness to palpation noted to musculature of back.  Tenderness to palpation to bilateral trapezius with spasm noted.  No overlying skin changes.  Patient with flexion and extension of neck intact.  Patient with decreased lateral rotation of neck to right and left secondary to pain at trapezius.  No tenderness to palpation noted to bilateral clavicles or bilateral shoulders.  Full active range of motion of bilateral upper extremities.  ?Skin: ?   General: Skin is warm and dry.  ?Neurological:  ?   Mental Status:  He is alert.  ?Psychiatric:     ?   Behavior: Behavior normal.  ? ? ?ED Results / Procedures / Treatments   ?Labs ?(all labs ordered are listed, but only abnormal results are displayed) ?Labs Reviewed - No data to display ? ?EKG ?None ? ?Radiology ?No results found. ? ?Procedures ?Procedures  ? ? ?Medications Ordered in ED ?Medications  ?methocarbamol (ROBAXIN) tablet 500 mg (500 mg Oral Given 07/04/21 1203)  ?ibuprofen (ADVIL) tablet 600 mg (600 mg Oral Given 07/04/21 1203)  ? ? ?ED Course/ Medical Decision Making/ A&P ?Clinical Course as of 07/04/21 1228  ?Thu Jul 04, 2021  ?1147 Discussed with patient discharge treatment plan.  Patient agreeable this time.  Patient appears safe for discharge. [SB]  ?  ?Clinical Course User Index ?[SB] ,   A, PA-C  ? ?                        ?Medical Decision Making ?Risk ?Prescription drug management. ? ? ?Pt presents with right lateral neck pain onset 4 days.  Denies injury, trauma, fall, heavy lifting.  Vital signs stable, patient afebrile, not tachycardic or hypoxic.  On exam patient without spinal tenderness to palpation.  No tenderness to palpation noted to musculature of back.  Tenderness to palpation to bilateral trapezius with spasm noted no overlying skin changes.  Patient with flexion and extension of neck intact.  Patient with decreased lateral rotation of neck to right and left secondary to pain at trapezius.  No tenderness to palpation noted to bilateral clavicles or bilateral shoulders.  Full active range of motion of bilateral upper extremities.  Differential diagnosis includes fracture, herniation, muscle spasm, strain. ? ?Medications:  ?I ordered medication including Robaxin, ibuprofen, warm compress for symptom management ?Reevaluation of the patient after these medicines and interventions, I reevaluated the patient and found that they have improved ?I have reviewed the patients home medicines and have made adjustments as needed ? ? ?Disposition: ?Patient presentation suspicious for bilateral trapezius muscle spasm.  Doubt fracture, herniation at this time, patient without spinal tenderness to palpation and full flexion and extension of neck.  Doubt muscle strain at this time, patient without injury, trauma, fall, heavy lifting.  After consideration of the diagnostic results and the patients response to treatment, I feel that the patient would benefit from Discharge home.  Will give patient prescription for Robaxin.  Supportive care measures and strict return precautions discussed with patient at bedside. Pt acknowledges and verbalizes understanding. Pt appears safe for discharge. Follow up as indicated in discharge paperwork.  ? ? ?This chart was dictated using voice recognition software, Dragon.  Despite the best efforts of this provider to proofread and correct errors, errors may still occur which can change documentation meaning. ? ? ?Final Clinical Impression(s) / ED Diagnoses ?Final diagnoses:  ?Trapezius muscle spasm  ? ? ?Rx / DC Orders ?ED Discharge Orders   ? ?      Ordered  ?  methocarbamol (ROBAXIN) 500 MG tablet  2 times daily       ? 07/04/21 1212  ? ?  ?  ? ?  ? ? ?  ?,  A, PA-C ?07/04/21 1228 ? ?  ?Isla Pence, MD ?07/04/21 1626 ? ?

## 2021-07-04 NOTE — ED Triage Notes (Signed)
Patient complains of right sided neck pain that started on Monday, denies known injury. Pain is exacerbated by movement. Patient is alert, oriented, ambulatory, and in no apparent distress at this time. ?

## 2021-10-23 IMAGING — US US EXTREM LOW VENOUS*L*
1 series · 14 of 24 positions shown · non-contrast
Comparison: None.

CLINICAL DATA: Left leg swelling

EXAM:
LEFT LOWER EXTREMITY VENOUS DOPPLER ULTRASOUND
TECHNIQUE: Gray-scale sonography with compression, as well as color and duplex
ultrasound, were performed to evaluate the deep venous system(s)
from the level of the common femoral vein through the popliteal and
proximal calf veins.

[Series 1: us extrem low venous*left* · 14 of 40 slices shown]
[im 1/40]
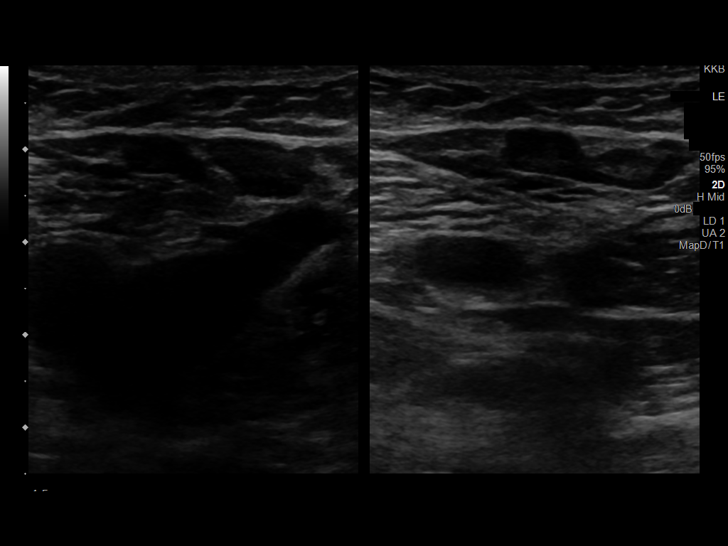
[im 4/40]
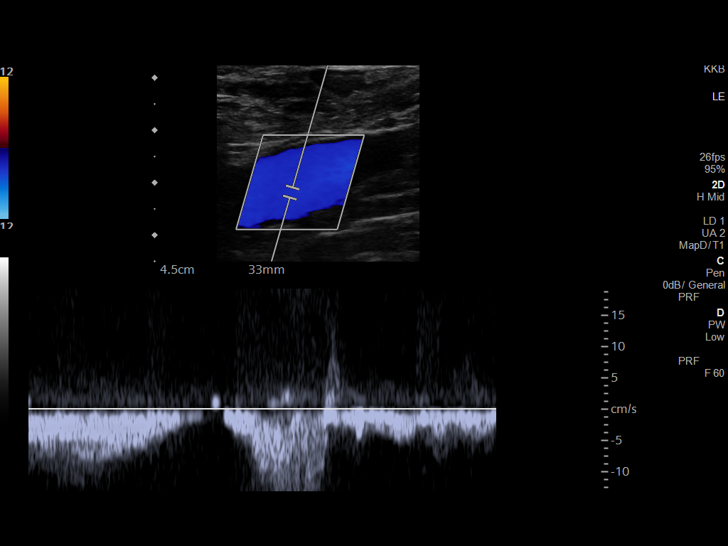
[im 7/40]
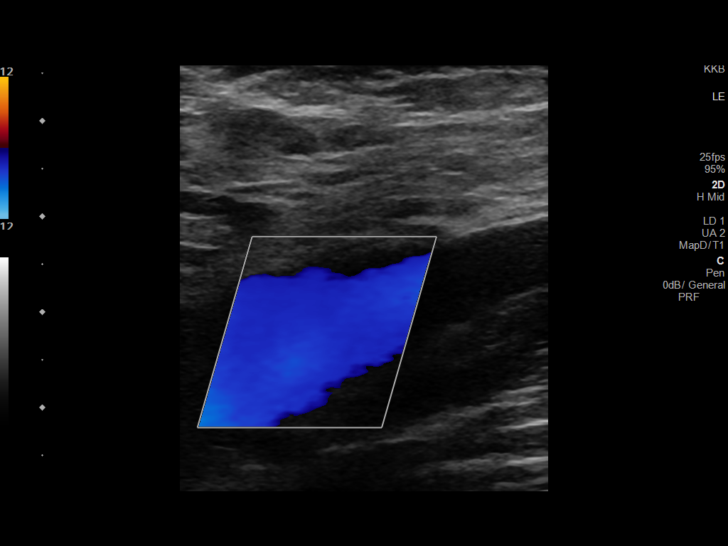
[im 11/40]
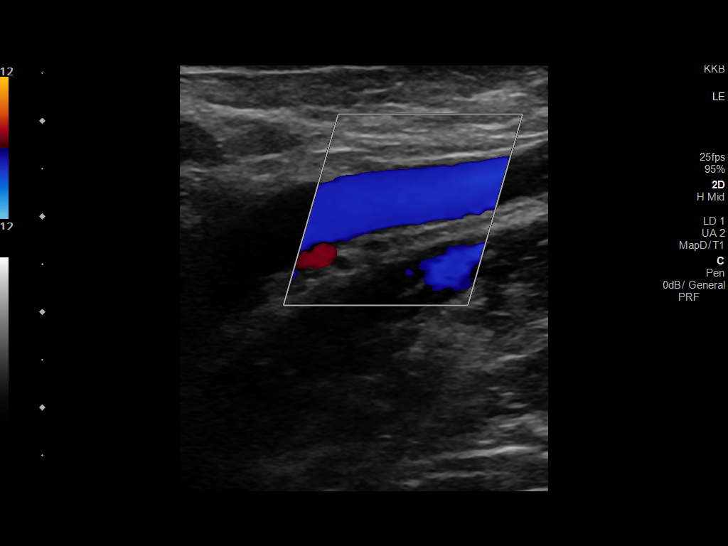
[im 12/40]
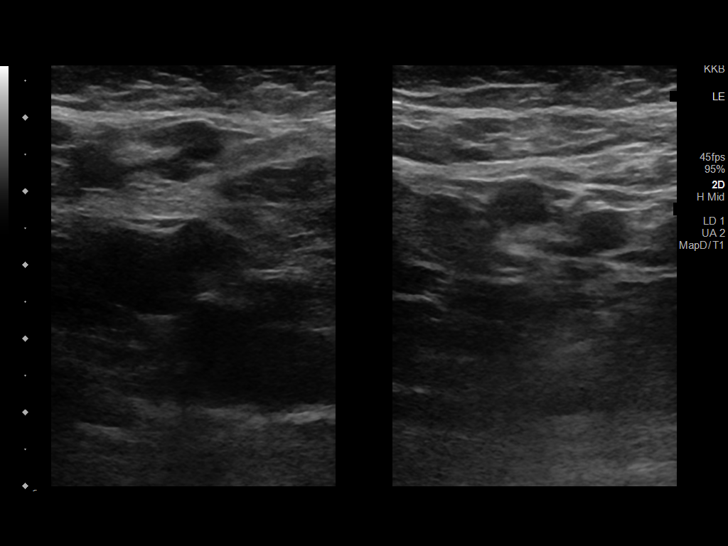
[im 16/40]
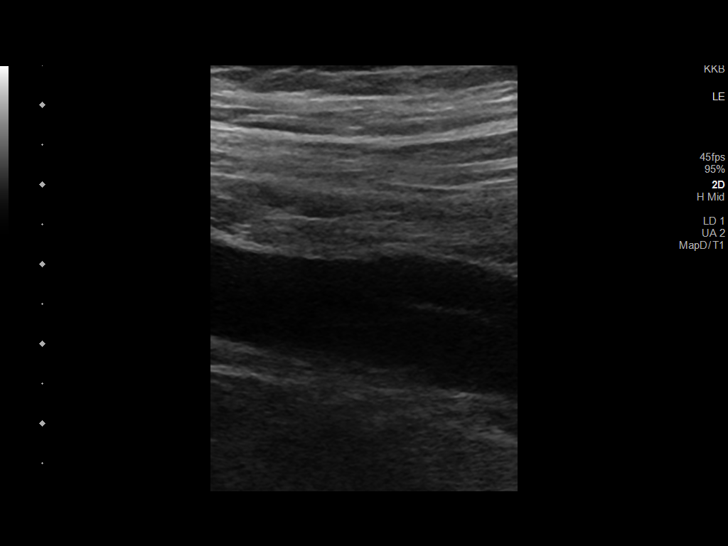
[im 19/40]
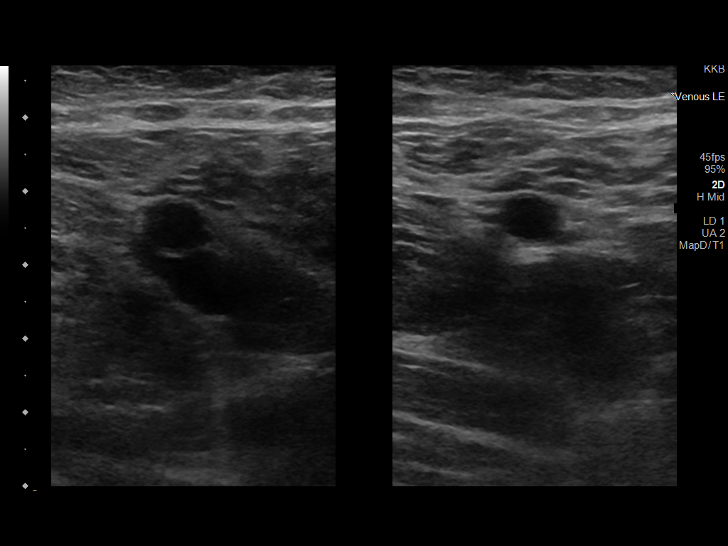
[im 21/40]
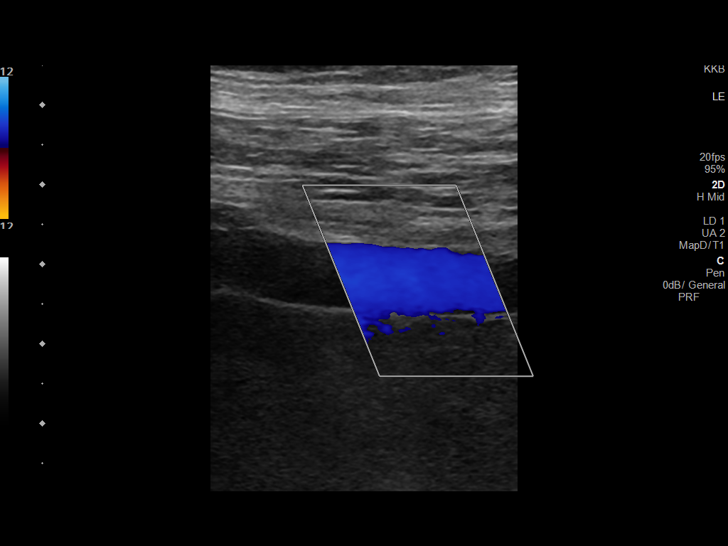
[im 24/40]
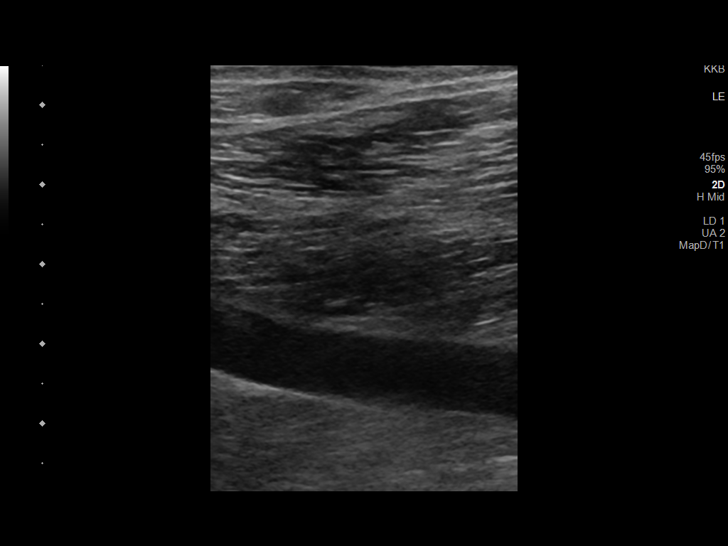
[im 28/40]
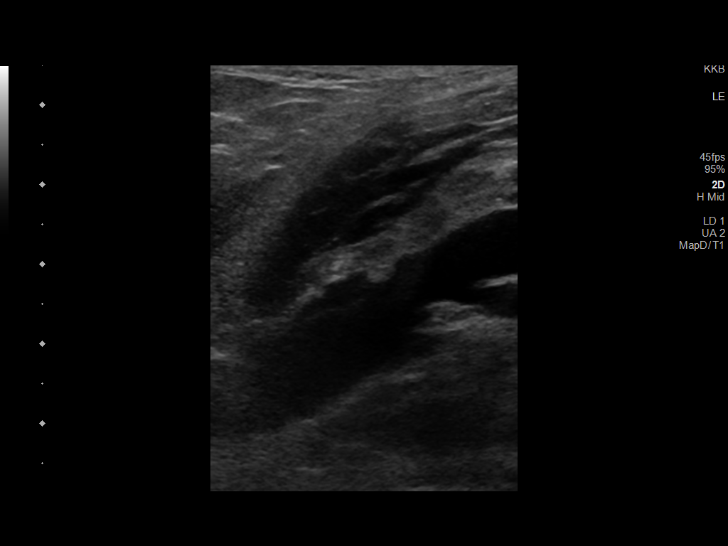
[im 31/40]
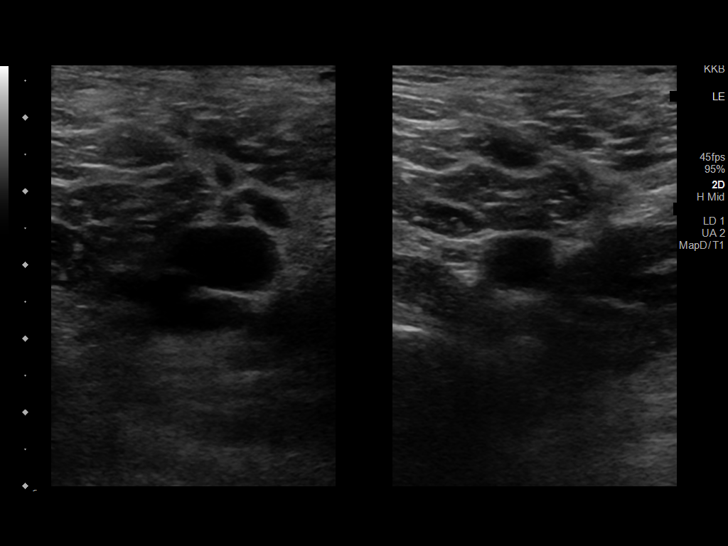
[im 33/40]
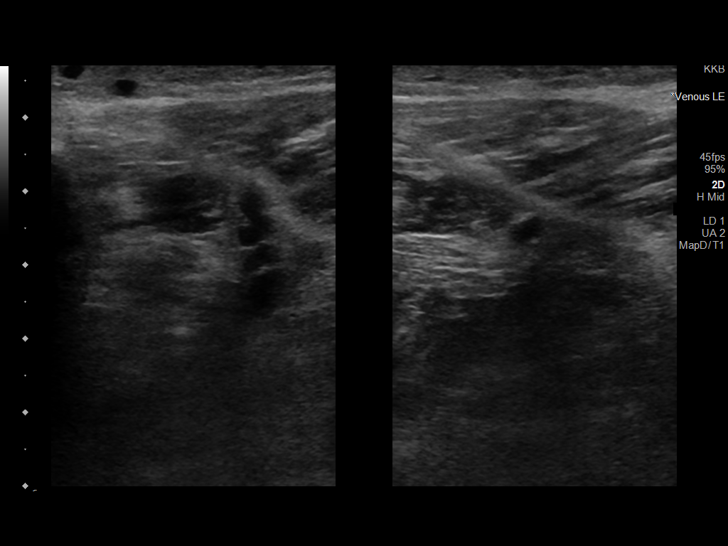
[im 36/40]
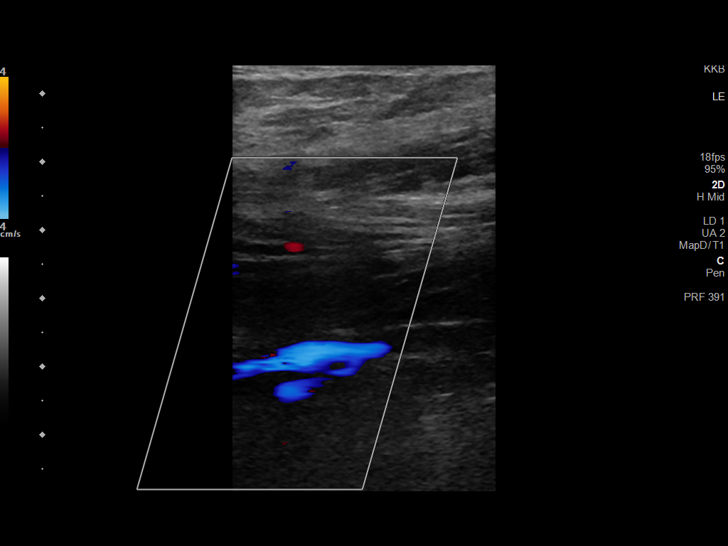
[im 40/40]
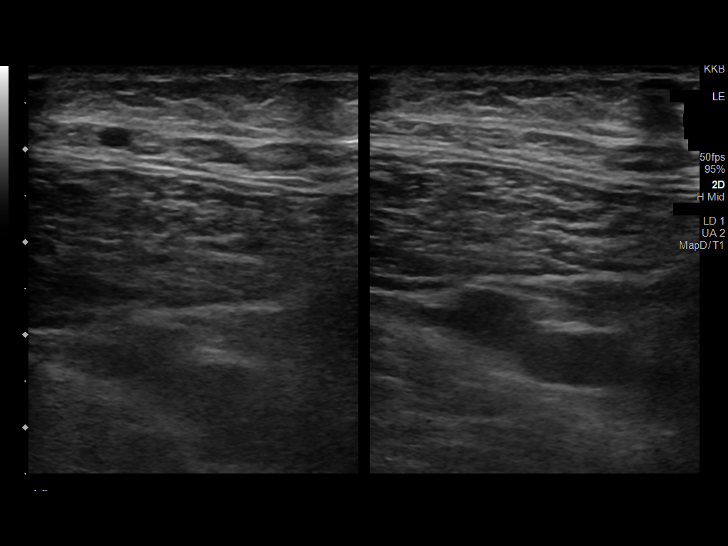

[14 of 24 positions shown; findings below may reference images not displayed]

FINDINGS: VENOUS

Normal compressibility of the common femoral, superficial femoral,
and popliteal veins, as well as the visualized calf veins.
Visualized portions of profunda femoral vein and great saphenous
vein unremarkable. No filling defects to suggest DVT on grayscale or
color Doppler imaging. Doppler waveforms show normal direction of
venous flow, normal respiratory plasticity and response to
augmentation.

Limited views of the contralateral common femoral vein are
unremarkable.

OTHER

None.

Limitations: none
IMPRESSION: Negative.

## 2022-09-10 ENCOUNTER — Encounter (HOSPITAL_COMMUNITY): Payer: Self-pay | Admitting: *Deleted

## 2022-09-10 ENCOUNTER — Ambulatory Visit (HOSPITAL_COMMUNITY)
Admission: EM | Admit: 2022-09-10 | Discharge: 2022-09-10 | Disposition: A | Discharge status: Home / Self Care | Payer: BC Managed Care – PPO | Attending: Emergency Medicine | Admitting: Emergency Medicine

## 2022-09-10 DIAGNOSIS — S29012A Strain of muscle and tendon of back wall of thorax, initial encounter: Secondary | ICD-10-CM | POA: Diagnosis not present

## 2022-09-10 HISTORY — DX: Essential (primary) hypertension: I10

## 2022-09-10 HISTORY — DX: Bronchitis, not specified as acute or chronic: J40

## 2022-09-10 MED ORDER — METHYLPREDNISOLONE SODIUM SUCC 125 MG IJ SOLR
80.0000 mg | Freq: Once | INTRAMUSCULAR | Status: AC
  Administered 2022-09-10: 80 mg via INTRAMUSCULAR

## 2022-09-10 MED ORDER — METHOCARBAMOL 500 MG PO TABS: 500.0000 mg | ORAL_TABLET | Freq: Two times a day (BID) | ORAL | 0 refills | Status: DC

## 2022-09-10 MED ORDER — METHYLPREDNISOLONE SODIUM SUCC 125 MG IJ SOLR
INTRAMUSCULAR | Status: AC
  Filled 2022-09-10: qty 2

## 2022-09-10 MED ORDER — NAPROXEN 500 MG PO TABS: 500.0000 mg | ORAL_TABLET | Freq: Two times a day (BID) | ORAL | 0 refills | Status: AC

## 2022-09-10 NOTE — Discharge Instructions (Addendum)
We have given you a steroid shot to help with your pain and inflammation in clinic.  You can take the muscle relaxers as needed, do not drink or drive on these as it may make you drowsy.  Please take the anti-inflammatory naproxen twice daily for the next 7 days.  Please continue to rest, you can ice or heat the area for pain relief.  Please return to clinic or seek follow-up care if you develop any new or concerning symptoms.

## 2022-09-10 NOTE — ED Triage Notes (Signed)
C/O starting with right lateral mid-back pain onset yesterday without known injury. Posterior lateral ribcage tender to palpation; states pain worse with deep breathing, movement, position changes, and palpation. Has not tried any meds today.

## 2022-09-10 NOTE — ED Provider Notes (Signed)
[facility]    CSN: [id number] Arrival date & time: 09/10/22  1920      History   Chief Complaint Chief Complaint  Patient presents with   Back Pain    HPI Mario [name] is a 42 y.o. male.   Patient presents to clinic for right-sided mid back pain that he noticed yesterday.  He was at home, bending down to pick up a pair of his shorts from the floor when he noticed the sharp pain.  Pain increases with bending, twisting, deep breathing and coughing.  Denies numbness or tingling.  Denies incontinence.  Denies trauma or falls.  No injuries.  He tried 600 mg of ibuprofen and using a heating pad yesterday with little relief.  He denies dysuria, flank pain, nausea, vomiting, cough, fevers or recent illness.  He is hypertensive, has a history of same.  Has been off of antihypertensive for over a year, he did call to get in contact with a primary care provider and get reestablished today.     The history is provided by the patient and medical records.  Back Pain Associated symptoms: no abdominal pain and no chest pain     Past Medical History:  Diagnosis Date   Bronchitis    Diverticulitis 10/2015   Hypertension     There are no problems to display for this patient.   History reviewed. No pertinent surgical history.     Home Medications    Prior to Admission medications   Medication Sig Start Date End Date Taking? Authorizing Provider  methocarbamol (ROBAXIN) 500 MG tablet Take 1 tablet (500 mg total) by mouth 2 (two) times daily. 09/10/22  Yes Rinaldo Ratel, [place] N, FNP  naproxen (NAPROSYN) 500 MG tablet Take 1 tablet (500 mg total) by mouth 2 (two) times daily for 7 days. 09/10/22 09/17/22 Yes , [place] N, FNP  amLODipine-benazepril (LOTREL) 5-20 MG capsule Take 1 capsule by mouth daily. 01/11/20   [provider]  chlorthalidone (HYGROTON) 25 MG tablet Take by mouth. 07/31/20   [provider]  fenofibrate (TRICOR) 145 MG tablet Take  by mouth. 01/10/20   [provider]  ibuprofen (ADVIL) 600 MG tablet Take 1 tablet (600 mg total) by mouth every 6 (six) hours as needed. 03/12/20   Sponseller, Lupe Carney R, PA-C  nadolol (CORGARD) 20 MG tablet Take 20 mg by mouth daily. 07/19/20   [provider]    Family History History reviewed. No pertinent family history.  Social History Social History   Tobacco Use   Smoking status: Every Day    Packs/day: .3    Types: Cigarettes   Smokeless tobacco: Never  Vaping Use   Vaping Use: Never used  Substance Use Topics   Alcohol use: Yes    Comment: occasionally   Drug use: Not Currently    Types: Cocaine     Allergies   Patient has no known allergies.   Review of Systems Review of Systems  Respiratory:  Negative for cough.   Cardiovascular:  Negative for chest pain.  Gastrointestinal:  Negative for abdominal pain.  Musculoskeletal:  Positive for back pain.  Neurological:  Negative for syncope.     Physical Exam Triage Vital Signs ED Triage Vitals  Enc Vitals Group     BP 09/10/22 1932 (!) 168/106     Pulse Rate 09/10/22 1932 75     Resp 09/10/22 1932 18     Temp 09/10/22 1932 97.9 F (36.6 C)  Temp Source 09/10/22 1932 Oral     SpO2 09/10/22 1932 96 %     Weight --      Height --      Head Circumference --      Peak Flow --      Pain Score 09/10/22 1934 9     Pain Loc --      Pain Edu? --      Excl. in GC? --    No data found.  Updated Vital Signs BP (!) 168/106 Comment: ran out of meds approx 2 yrs ago  Pulse 75   Temp 97.9 F (36.6 C) (Oral)   Resp 18   SpO2 96%   Visual Acuity Right Eye Distance:   Left Eye Distance:   Bilateral Distance:    Right Eye Near:   Left Eye Near:    Bilateral Near:     Physical Exam Vitals and nursing note reviewed.  Constitutional:      Appearance: Normal appearance.  HENT:     Head: Normocephalic and atraumatic.     Right Ear: External ear normal.     Left Ear: External ear  normal.     Nose: Nose normal.     Mouth/Throat:     Mouth: Mucous membranes are moist.  Eyes:     Conjunctiva/sclera: Conjunctivae normal.  Cardiovascular:     Rate and Rhythm: Normal rate and regular rhythm.     Heart sounds: Normal heart sounds. No murmur heard. Pulmonary:     Effort: Pulmonary effort is normal. No respiratory distress.     Breath sounds: Normal breath sounds.  Musculoskeletal:        General: Tenderness present. No swelling, deformity or signs of injury. Normal range of motion.     Cervical back: Normal and normal range of motion.     Thoracic back: Tenderness present.     Lumbar back: Normal.       Back:     Comments: Right sided thoracic MS TTP  Skin:    General: Skin is warm and dry.  Neurological:     General: No focal deficit present.     Mental Status: He is alert and oriented to person, place, and time.  Psychiatric:        Mood and Affect: Mood normal.        Behavior: Behavior normal. Behavior is cooperative.      UC Treatments / Results  Labs (all labs ordered are listed, but only abnormal results are displayed) Labs Reviewed - No data to display  EKG   Radiology No results found.  Procedures Procedures (including critical care time)  Medications Ordered in UC Medications  methylPREDNISolone sodium succinate (SOLU-MEDROL) 125 mg/2 mL injection 80 mg (has no administration in time range)    Initial Impression / Assessment and Plan / UC Course  I have reviewed the triage vital signs and the nursing notes.  Pertinent labs & imaging results that were available during my care of the patient were reviewed by me and considered in my medical decision making (see chart for details).  Vitals and triage reviewed, patient is hemodynamically stable.  Has a history of hypertension, is following up with her primary care soon about his elevated blood pressure.  He does have right-sided thoracic musculoskeletal TTP, pain also elicited with deep  breathing, coughing, bending and twisting.  No obvious injuries or trauma.  Spine without step-off or deformity.  Without red flag symptoms of cauda equina, incontinence, numbness  or tingling.  Suspect musculoskeletal strain, given IM steroid injection, sent home on anti-inflammatories and muscle relaxers.  Symptomatic management discussed.  Return and follow-up precautions reviewed, no questions at this time.     Final Clinical Impressions(s) / UC Diagnoses   Final diagnoses:  Strain of thoracic back region     Discharge Instructions      We have given you a steroid shot to help with your pain and inflammation in clinic.  You can take the muscle relaxers as needed, do not drink or drive on these as it may make you drowsy.  Please take the anti-inflammatory naproxen twice daily for the next 7 days.  Please continue to rest, you can ice or heat the area for pain relief.  Please return to clinic or seek follow-up care if you develop any new or concerning symptoms.     ED Prescriptions     Medication Sig Dispense Auth. Provider   methocarbamol (ROBAXIN) 500 MG tablet Take 1 tablet (500 mg total) by mouth 2 (two) times daily. 20 tablet Rinaldo Ratel, [place] N, [place]   naproxen (NAPROSYN) 500 MG tablet Take 1 tablet (500 mg total) by mouth 2 (two) times daily for 7 days. 14 tablet , [place] N, [place]      PDMP not reviewed this encounter.   , [place] N, [place] 09/10/22 [phone]

## 2022-09-19 ENCOUNTER — Emergency Department (HOSPITAL_COMMUNITY)
Admission: EM | Admit: 2022-09-19 | Discharge: 2022-09-20 | Disposition: A | Discharge status: Home / Self Care | Payer: BC Managed Care – PPO | Attending: Emergency Medicine | Admitting: Emergency Medicine

## 2022-09-19 ENCOUNTER — Encounter (HOSPITAL_COMMUNITY): Payer: Self-pay | Admitting: Emergency Medicine

## 2022-09-19 ENCOUNTER — Other Ambulatory Visit: Payer: Self-pay

## 2022-09-19 DIAGNOSIS — M5442 Lumbago with sciatica, left side: Secondary | ICD-10-CM | POA: Diagnosis not present

## 2022-09-19 DIAGNOSIS — M5441 Lumbago with sciatica, right side: Secondary | ICD-10-CM | POA: Diagnosis not present

## 2022-09-19 DIAGNOSIS — M545 Low back pain, unspecified: Secondary | ICD-10-CM | POA: Diagnosis present

## 2022-09-19 MED ORDER — KETOROLAC TROMETHAMINE 30 MG/ML IJ SOLN
30.0000 mg | Freq: Once | INTRAMUSCULAR | Status: AC
  Administered 2022-09-19: 30 mg via INTRAMUSCULAR
  Filled 2022-09-19: qty 1

## 2022-09-19 MED ORDER — OXYCODONE-ACETAMINOPHEN 5-325 MG PO TABS
2.0000 | ORAL_TABLET | Freq: Once | ORAL | Status: AC
  Administered 2022-09-19: 2 via ORAL
  Filled 2022-09-19: qty 2

## 2022-09-19 NOTE — ED Triage Notes (Signed)
Pt in with central midback pain, ongoing since 5/22. Pt states his balance has been off, and is reporting numbness tingling to bilateral legs. Denies any b&b control problems

## 2022-09-20 MED ORDER — PREDNISONE 20 MG PO TABS: 40.0000 mg | ORAL_TABLET | Freq: Every day | ORAL | 0 refills | Status: DC

## 2022-09-20 MED ORDER — LIDOCAINE 5 % EX PTCH: 1.0000 | MEDICATED_PATCH | CUTANEOUS | 0 refills | Status: DC

## 2022-09-20 MED ORDER — CYCLOBENZAPRINE HCL 10 MG PO TABS: 10.0000 mg | ORAL_TABLET | Freq: Three times a day (TID) | ORAL | 0 refills | Status: DC | PRN

## 2022-09-20 NOTE — ED Provider Notes (Signed)
[facility] Emergency Department Provider Note MRN:  [id number]  Arrival date & time: 09/20/22     Chief Complaint   Back Pain   History of Present Illness   Mario [name] is a 42 y.o. year-old male presents to the ED with chief complaint of low back pain.  He reports having had the pain for the past couple of weeks.  Denies any specific injury.  He denies bowel or bladder incontinence.  He states that he has had some numbness/tingling sensation in the legs.  He denies fever, chills, hx of back surgery or cancer.  He is able to walk, but states that it is difficult.  History provided by patient.   Review of Systems  Pertinent positive and negative review of systems noted in HPI.    Physical Exam   Vitals:   09/19/22 2128  BP: (!) 158/88  Pulse: 66  Resp: 18  Temp: 98.3 F (36.8 C)  SpO2: 97%    CONSTITUTIONAL:  non toxic-appearing, NAD NEURO:  Alert and oriented x 3, CN 3-12 grossly intact EYES:  eyes equal and reactive ENT/NECK:  Supple, no stridor  CARDIO:  normal rate, appears well-perfused, intact distal pulses PULM:  No respiratory distress,  GI/GU:  non-distended,  MSK/SPINE:  No gross deformities, no edema, moves all extremities, antalgic gait, normal strength of bilateral lower extremities, lumbar paraspinal muscle tenderness SKIN:  no rash, atraumatic   *Additional and/or pertinent findings included in MDM below  Diagnostic and Interventional Summary    EKG Interpretation  Date/Time:    Ventricular Rate:    PR Interval:    QRS Duration:   QT Interval:    QTC Calculation:   R Axis:     Text Interpretation:         Labs Reviewed - No data to display  No orders to display    Medications  ketorolac (TORADOL) 30 MG/ML injection 30 mg (30 mg Intramuscular Given 09/19/22 2319)  oxyCODONE-acetaminophen (PERCOCET/ROXICET) 5-325 MG per tablet 2 tablet (2 tablets Oral Given 09/19/22 2320)     Procedures  /  Critical  Care Procedures  ED Course and Medical Decision Making  I have reviewed the triage vital signs, the nursing notes, and pertinent available records from the EMR.  Social Determinants Affecting Complexity of Care: Patient has no clinically significant social determinants affecting this chief complaint..   ED Course:    Medical Decision Making Patient with back pain.    No neurological deficits and normal neuro exam.  Patient is ambulatory.  No loss of bowel or bladder control.  Doubt cauda equina.  Denies fever,  doubt epidural abscess or other lesion.  He is able to ambulate with an antalgic gait, but does so unassisted.  His balance is normal, though he walks gingerly. Recommend back exercises, stretching, RICE, and will treat with a short course of prednisone, flexeril, and lidoderm.  Consultants: none  Review of prior charts show prior eval at urgent care for back muscle strain.  Encouraged the patient that there could be a need for additional workup and/or imaging such as MRI, if the symptoms do not resolve. Patient advised that if the back pain does not resolve, or radiates, this could progress to more serious conditions and is encouraged to follow-up with PCP or orthopedics within 2 weeks.     Risk Prescription drug management.     Consultants: No consultations were needed in caring for this patient.   Treatment and Plan: Emergency department  workup does not suggest an emergent condition requiring admission or immediate intervention beyond  what has been performed at this time. The patient is safe for discharge and has  been instructed to return immediately for worsening symptoms, change in  symptoms or any other concerns    Final Clinical Impressions(s) / ED Diagnoses     ICD-10-CM   1. Bilateral low back pain with bilateral sciatica, unspecified chronicity  M54.42    M54.41       ED Discharge Orders          Ordered    cyclobenzaprine (FLEXERIL) 10 MG tablet  3  times daily PRN        09/20/22 0010    predniSONE (DELTASONE) 20 MG tablet  Daily        09/20/22 0010    lidocaine (LIDODERM) 5 %  Every 24 hours        09/20/22 0010              Discharge Instructions Discussed with and Provided to Patient:    Discharge Instructions      The [facility] that I'm sending you to also has a walk-in clinic.  They are open tomorrow from 9am to 2pm.      Roxy Horseman, PA-C 09/20/22 0016    Eudelia Bunch Amadeo Garnet, MD 09/20/22 [phone]

## 2022-09-20 NOTE — Discharge Instructions (Signed)
The [facility] that I'm sending you to also has a walk-in clinic.  They are open tomorrow from 9am to 2pm.

## 2022-10-21 ENCOUNTER — Other Ambulatory Visit: Payer: Self-pay | Admitting: Orthopedic Surgery

## 2022-10-21 DIAGNOSIS — M546 Pain in thoracic spine: Secondary | ICD-10-CM

## 2022-10-27 ENCOUNTER — Ambulatory Visit
Admission: RE | Admit: 2022-10-27 | Discharge: 2022-10-27 | Disposition: A | Discharge status: Home / Self Care | Payer: BC Managed Care – PPO | Source: Ambulatory Visit | Attending: Orthopedic Surgery | Admitting: Orthopedic Surgery

## 2022-10-27 DIAGNOSIS — M546 Pain in thoracic spine: Secondary | ICD-10-CM

## 2022-10-27 MED ORDER — DIAZEPAM 5 MG PO TABS: 10.0000 mg | ORAL_TABLET | Freq: Once | ORAL | Status: DC

## 2022-10-27 MED ORDER — MEPERIDINE HCL 50 MG/ML IJ SOLN
50.0000 mg | Freq: Once | INTRAMUSCULAR | Status: AC | PRN
  Administered 2022-10-27: 75 mg via INTRAMUSCULAR

## 2022-10-27 MED ORDER — ONDANSETRON HCL 4 MG/2ML IJ SOLN
4.0000 mg | Freq: Once | INTRAMUSCULAR | Status: AC | PRN
  Administered 2022-10-27: 4 mg via INTRAMUSCULAR

## 2022-10-27 MED ORDER — IOPAMIDOL (ISOVUE-M 300) INJECTION 61%
10.0000 mL | Freq: Once | INTRAMUSCULAR | Status: AC | PRN
  Administered 2022-10-27: 10 mL via INTRATHECAL

## 2022-10-27 NOTE — Discharge Instr - Other Info (Addendum)
1105: pt reports pain 9/10 from myelogram procedure. See MAR.  1131: pt reports pain is much better since receiving pain medication, ranking pain 1/10.

## 2022-10-27 NOTE — Discharge Instructions (Signed)

## 2022-11-13 ENCOUNTER — Other Ambulatory Visit: Payer: Self-pay

## 2022-11-13 ENCOUNTER — Emergency Department (HOSPITAL_COMMUNITY): Payer: BC Managed Care – PPO

## 2022-11-13 ENCOUNTER — Encounter (HOSPITAL_COMMUNITY): Payer: Self-pay | Admitting: Emergency Medicine

## 2022-11-13 ENCOUNTER — Emergency Department (HOSPITAL_COMMUNITY)
Admission: EM | Admit: 2022-11-13 | Discharge: 2022-11-14 | Disposition: A | Discharge status: Home / Self Care | Payer: BC Managed Care – PPO | Attending: Emergency Medicine | Admitting: Emergency Medicine

## 2022-11-13 DIAGNOSIS — R519 Headache, unspecified: Secondary | ICD-10-CM | POA: Insufficient documentation

## 2022-11-13 DIAGNOSIS — K0889 Other specified disorders of teeth and supporting structures: Secondary | ICD-10-CM | POA: Diagnosis not present

## 2022-11-13 DIAGNOSIS — M79602 Pain in left arm: Secondary | ICD-10-CM | POA: Diagnosis not present

## 2022-11-13 DIAGNOSIS — G4733 Obstructive sleep apnea (adult) (pediatric): Secondary | ICD-10-CM

## 2022-11-13 DIAGNOSIS — F1490 Cocaine use, unspecified, uncomplicated: Secondary | ICD-10-CM

## 2022-11-13 DIAGNOSIS — R42 Dizziness and giddiness: Secondary | ICD-10-CM | POA: Insufficient documentation

## 2022-11-13 DIAGNOSIS — R112 Nausea with vomiting, unspecified: Secondary | ICD-10-CM | POA: Diagnosis not present

## 2022-11-13 DIAGNOSIS — H53149 Visual discomfort, unspecified: Secondary | ICD-10-CM | POA: Insufficient documentation

## 2022-11-13 DIAGNOSIS — I10 Essential (primary) hypertension: Secondary | ICD-10-CM

## 2022-11-13 DIAGNOSIS — Z79899 Other long term (current) drug therapy: Secondary | ICD-10-CM | POA: Diagnosis not present

## 2022-11-13 DIAGNOSIS — I160 Hypertensive urgency: Secondary | ICD-10-CM

## 2022-11-13 LAB — LIPASE, BLOOD: Lipase: 46 U/L (ref 11–51)

## 2022-11-13 LAB — COMPREHENSIVE METABOLIC PANEL
ALT: 23 U/L (ref 0–44)
AST: 26 U/L (ref 15–41)
Albumin: 3.9 g/dL (ref 3.5–5.0)
Alkaline Phosphatase: 96 U/L (ref 38–126)
Anion gap: 13 (ref 5–15)
BUN: 7 mg/dL (ref 6–20)
CO2: 21 mmol/L — ABNORMAL LOW (ref 22–32)
Calcium: 9.3 mg/dL (ref 8.9–10.3)
Chloride: 102 mmol/L (ref 98–111)
Creatinine, Ser: 0.86 mg/dL (ref 0.61–1.24)
GFR, Estimated: >60 mL/min (ref >60)
Glucose, Bld: 154 mg/dL — ABNORMAL HIGH (ref 70–99)
Potassium: 4.2 mmol/L (ref 3.5–5.1)
Sodium: 136 mmol/L (ref 135–145)
Total Bilirubin: 0.6 mg/dL (ref 0.3–1.2)
Total Protein: 7.4 g/dL (ref 6.5–8.1)

## 2022-11-13 LAB — URINALYSIS, ROUTINE W REFLEX MICROSCOPIC
Bacteria, UA: NONE SEEN
Bilirubin Urine: NEGATIVE
Glucose, UA: NEGATIVE mg/dL
Ketones, ur: NEGATIVE mg/dL
Leukocytes,Ua: NEGATIVE
Nitrite: NEGATIVE
Protein, ur: NEGATIVE mg/dL
Specific Gravity, Urine: 1.002 — ABNORMAL LOW (ref 1.005–1.030)
pH: 6 (ref 5.0–8.0)

## 2022-11-13 LAB — CBC
HCT: 49.3 % (ref 39.0–52.0)
Hemoglobin: 17 g/dL (ref 13.0–17.0)
MCH: 31.1 pg (ref 26.0–34.0)
MCHC: 34.5 g/dL (ref 30.0–36.0)
MCV: 90.1 fL (ref 80.0–100.0)
Platelets: 337 10*3/uL (ref 150–400)
RBC: 5.47 MIL/uL (ref 4.22–5.81)
RDW: 14.7 % (ref 11.5–15.5)
WBC: 10.3 10*3/uL (ref 4.0–10.5)
nRBC: 0 % (ref 0.0–0.2)

## 2022-11-13 MED ORDER — DIPHENHYDRAMINE HCL 50 MG/ML IJ SOLN
12.5000 mg | Freq: Once | INTRAMUSCULAR | Status: AC
  Administered 2022-11-13: 12.5 mg via INTRAVENOUS
  Filled 2022-11-13: qty 1

## 2022-11-13 MED ORDER — IOHEXOL 350 MG/ML SOLN
75.0000 mL | Freq: Once | INTRAVENOUS | Status: AC | PRN
  Administered 2022-11-13: 75 mL via INTRAVENOUS

## 2022-11-13 MED ORDER — MORPHINE SULFATE (PF) 4 MG/ML IV SOLN
4.0000 mg | Freq: Once | INTRAVENOUS | Status: AC
  Administered 2022-11-13: 4 mg via INTRAVENOUS
  Filled 2022-11-13: qty 1

## 2022-11-13 MED ORDER — HYDRALAZINE HCL 20 MG/ML IJ SOLN
5.0000 mg | Freq: Once | INTRAMUSCULAR | Status: AC
  Administered 2022-11-13: 5 mg via INTRAVENOUS
  Filled 2022-11-13: qty 1

## 2022-11-13 MED ORDER — METOCLOPRAMIDE HCL 5 MG/ML IJ SOLN
10.0000 mg | Freq: Once | INTRAMUSCULAR | Status: AC
  Administered 2022-11-13: 10 mg via INTRAVENOUS
  Filled 2022-11-13: qty 2

## 2022-11-13 NOTE — ED Notes (Signed)
Pt requested pain medication for headache. Triage RN notified.

## 2022-11-13 NOTE — ED Triage Notes (Signed)
Pt went to UC for headache, toothache and vomiting and while there pt blood pressure was 216/98 and was advised to come to ER. Pt states bp meds were changed 1  month ago. Pt states he has been compliant with medication

## 2022-11-13 NOTE — ED Notes (Signed)
Pt complaining of increased nausea.

## 2022-11-13 NOTE — ED Provider Notes (Signed)
[place] EMERGENCY DEPARTMENT AT [facility] Provider Note   CSN: [id number] Arrival date & time: 11/13/22  1223     History  Chief Complaint  Patient presents with   Headache   Hypertension    Mario [name] is a 42 y.o. male.  Patient is a 42 year old male presenting today with a complaint of dental pain and headache that started several days ago.  He reports the pain seems to be more on the left side and goes up into his face.  He also complains of feeling dizzy when he stands up which usually resolves after he stood for a while.  He has not had fever or cough but reports that yesterday he also had vomiting related to the headache.  He did take some ibuprofen yesterday with mild improvement of the headache but it has been worse today.  He denies any numbness or weakness in his upper or lower extremities.  No confusion.  Reports still feeling nauseated at this time.  He denies any neck pain, recent tick bites or visual changes.  Mild photophobia.  He denies having a headache like this in the past and states he very rarely has a headache.  He has been compliant with his valsartan which was changed 1 month ago.  When he was seen at his doctor's office 1 month ago blood pressure had been elevated but his last visit blood pressure was 135/80 and on multiple visits this is pt's normal BP.  Patient denies any chest pain but does complain of some pain in his left arm.  He initially went to urgent care but because of a severely elevated blood pressure they sent him to the emergency room for further evaluation.  The history is provided by the patient, the spouse and medical records.  Headache Hypertension Associated symptoms include headaches.       Home Medications Prior to Admission medications   Medication Sig Start Date End Date Taking? Authorizing Provider  amLODipine-benazepril (LOTREL) 5-20 MG capsule Take 1 capsule by mouth daily. 01/11/20   [provider]   chlorthalidone (HYGROTON) 25 MG tablet Take by mouth. 07/31/20   [provider]  cyclobenzaprine (FLEXERIL) 10 MG tablet Take 1 tablet (10 mg total) by mouth 3 (three) times daily as needed for muscle spasms. 09/20/22   Roxy Horseman, PA-C  fenofibrate (TRICOR) 145 MG tablet Take by mouth. 01/10/20   [provider]  ibuprofen (ADVIL) 600 MG tablet Take 1 tablet (600 mg total) by mouth every 6 (six) hours as needed. 03/12/20   Sponseller, Lupe Carney R, PA-C  lidocaine (LIDODERM) 5 % Place 1 patch onto the skin daily. Remove & Discard patch within 12 hours or as directed by MD 09/20/22   Roxy Horseman, PA-C  methocarbamol (ROBAXIN) 500 MG tablet Take 1 tablet (500 mg total) by mouth 2 (two) times daily. 09/10/22   Garrison, [place] N, FNP  nadolol (CORGARD) 20 MG tablet Take 20 mg by mouth daily. 07/19/20   [provider]  predniSONE (DELTASONE) 20 MG tablet Take 2 tablets (40 mg total) by mouth daily. 09/20/22   Roxy Horseman, PA-C      Allergies    Patient has no known allergies.    Review of Systems   Review of Systems  Neurological:  Positive for headaches.    Physical Exam Updated Vital Signs BP (!) 211/96 (BP Location: Left Arm)   Pulse 98   Temp 98.4 F (36.9 C) (Oral)   Resp (!) 22  Ht 6\' 1"  (1.854 m)   Wt 129.3 kg   SpO2 99%   BMI 37.60 kg/m  Physical Exam Vitals and nursing note reviewed.  Constitutional:      General: He is not in acute distress.    Appearance: Normal appearance. He is well-developed.  HENT:     Head: Normocephalic and atraumatic.     Mouth/Throat:     Mouth: Mucous membranes are moist.     Comments: Severe dental decay throughout multiple teeth in the upper and lower portions of the mouth.  Point tenderness over the lateral left upper incisor with evidence of decay but no significant gum edema or fluctuance.  No facial swelling Eyes:     Extraocular Movements: Extraocular movements intact.     Conjunctiva/sclera:  Conjunctivae normal.     Pupils: Pupils are equal, round, and reactive to light.  Cardiovascular:     Rate and Rhythm: Normal rate and regular rhythm.     Pulses: Normal pulses.     Heart sounds: No murmur heard. Pulmonary:     Effort: Pulmonary effort is normal. No respiratory distress.     Breath sounds: Normal breath sounds. No wheezing or rales.  Abdominal:     General: There is no distension.     Palpations: Abdomen is soft.     Tenderness: There is no abdominal tenderness. There is no guarding or rebound.  Musculoskeletal:        General: No tenderness. Normal range of motion.     Cervical back: Normal range of motion and neck supple. No rigidity or tenderness.     Right lower leg: No edema.     Left lower leg: No edema.  Skin:    General: Skin is warm and dry.     Findings: No erythema or rash.  Neurological:     Mental Status: He is alert and oriented to person, place, and time. Mental status is at baseline.     Sensory: No sensory deficit.     Motor: No weakness.     Coordination: Coordination normal.     Comments: Speech is normal, normal mentation.  Psychiatric:        Mood and Affect: Mood normal.        Behavior: Behavior normal.     ED Results / Procedures / Treatments   Labs (all labs ordered are listed, but only abnormal results are displayed) Labs Reviewed  COMPREHENSIVE METABOLIC PANEL - Abnormal; Notable for the following components:      Result Value   CO2 21 (*)    Glucose, Bld 154 (*)    All other components within normal limits  URINALYSIS, ROUTINE W REFLEX MICROSCOPIC - Abnormal; Notable for the following components:   Color, Urine COLORLESS (*)    Specific Gravity, Urine 1.002 (*)    Hgb urine dipstick MODERATE (*)    All other components within normal limits  LIPASE, BLOOD  CBC    EKG EKG Interpretation Date/Time:  Thursday November 13 2022 19:26:02 EDT Ventricular Rate:  82 PR Interval:  142 QRS Duration:  80 QT Interval:  390 QTC  Calculation: 455 R Axis:   35  Text Interpretation: Normal sinus rhythm Possible Inferior infarct , age undetermined No previous ECGs available Confirmed by Gwyneth Sprout ([id number]) on 11/13/2022 7:59:51 PM  Radiology CT Head Wo Contrast  Result Date: 11/13/2022 CLINICAL DATA:  Headache EXAM: CT HEAD WITHOUT CONTRAST TECHNIQUE: Contiguous axial images were obtained from the base of the skull through  the vertex without intravenous contrast. RADIATION DOSE REDUCTION: This exam was performed according to the departmental dose-optimization program which includes automated exposure control, adjustment of the mA and/or kV according to patient size and/or use of iterative reconstruction technique. COMPARISON:  CT head 05/28/2019 FINDINGS: Brain: There is no acute intracranial hemorrhage, extra-axial fluid collection, or acute infarct Parenchymal volume is normal. The ventricles are normal in size. Gray-white differentiation is preserved The pituitary and suprasellar region are normal. There is no mass lesion. There is no mass effect or midline shift. Vascular: No hyperdense vessel or unexpected calcification. Skull: Normal. Negative for fracture or focal lesion. Sinuses/Orbits: There is mild mucosal thickening in the paranasal sinuses. The globes and orbits are unremarkable. Other: A nasal septal perforation is noted, not included within the field of view and 2021. The mastoid air cells and middle ear cavities are clear. IMPRESSION: 1. No acute intracranial pathology. 2. Perforated nasal septum. Electronically Signed   By: Lesia Hausen M.D.   On: 11/13/2022 18:49    Procedures Procedures    Medications Ordered in ED Medications  metoCLOPramide (REGLAN) injection 10 mg (10 mg Intravenous Given 11/13/22 1747)  diphenhydrAMINE (BENADRYL) injection 12.5 mg (12.5 mg Intravenous Given 11/13/22 1748)  morphine (PF) 4 MG/ML injection 4 mg (4 mg Intravenous Given 11/13/22 1803)  hydrALAZINE (APRESOLINE) injection 5  mg (5 mg Intravenous Given 11/13/22 2113)  morphine (PF) 4 MG/ML injection 4 mg (4 mg Intravenous Given 11/13/22 2210)  iohexol (OMNIPAQUE) 350 MG/ML injection 75 mL (75 mLs Intravenous Contrast Given 11/13/22 2239)    ED Course/ Medical Decision Making/ A&P                             Medical Decision Making Amount and/or Complexity of Data Reviewed Labs: ordered. Decision-making details documented in ED Course. Radiology: ordered and independent interpretation performed. Decision-making details documented in ED Course. ECG/medicine tests: ordered and independent interpretation performed. Decision-making details documented in ED Course.  Risk Prescription drug management.   Pt with multiple medical problems and comorbidities and presenting today with a complaint that caries a high risk for morbidity and mortality.  Here today with severe headache and dental pain.  Patient initially seen in urgent care and due to significantly elevated blood pressure he was sent here for further care.  Patient had his amlodipine switched to valsartan about a month ago has been compliant with that medication and did follow-up with his doctor and based on office visits last blood pressure read was 135/90 in the office 2 months ago.  Patient has no focal neurologic deficits at this time but is complaining of left-sided headache.  Concern for dental carry causing pain and migraine however also concern for hypertensive urgency, intracranial hemorrhage.  Low suspicion for infectious etiology such as tickborne illness, meningitis or viral etiology.  Also because patient has been vomiting and did complain of some arm pain which seems musculoskeletal in nature will do an screening EKG.  Patient given a headache cocktail.  I independently interpreted patient's labs and EKG.  CBC, CMP, lipase all within normal limits.  EKG without acute findings. Will recheck blood pressure once pain is controlled.  I have independently  visualized and interpreted pt's images today.  Head Ct without signs of acute bleeding.  On re-eval pt still having HA but reports some better.  Denies cocaine use, sudafed or other stimulants.  BP still elevated and still NV intact.  Pt given  IV hydralazine.    10:44 PM On reevaluation patient is still having significant headache and now reports the dizziness is more vertiginous.  Finger-to-nose testing is normal, no obvious signs of nystagmus on exam.  After hydralazine blood pressure is only moderately improved to now 211/96 and patient is still complaining of symptoms and headache.  Concerned that maybe patient has had a posterior stroke as his symptoms have been present since yesterday and there is no explanation for his hypertension.  Still low suspicion for infectious etiology.  Will do a CTA of the head and neck as well as an MRI for further evaluation.         Final Clinical Impression(s) / ED Diagnoses Final diagnoses:  None    Rx / DC Orders ED Discharge Orders     None         Gwyneth Sprout, MD 11/13/22 [phone]

## 2022-11-13 NOTE — ED Notes (Signed)
Pt asking for pain meds, triage RN notified.

## 2022-11-14 ENCOUNTER — Emergency Department (HOSPITAL_COMMUNITY): Payer: BC Managed Care – PPO

## 2022-11-14 MED ORDER — HYDRALAZINE HCL 20 MG/ML IJ SOLN: 10.0000 mg | Freq: Once | INTRAMUSCULAR | Status: DC

## 2022-11-14 MED ORDER — AMOXICILLIN-POT CLAVULANATE 875-125 MG PO TABS: 1.0000 | ORAL_TABLET | Freq: Two times a day (BID) | ORAL | 0 refills | Status: DC

## 2022-11-14 MED ORDER — LORAZEPAM 2 MG/ML IJ SOLN
1.0000 mg | Freq: Once | INTRAMUSCULAR | Status: AC
  Administered 2022-11-14: 1 mg via INTRAVENOUS
  Filled 2022-11-14: qty 1

## 2022-11-14 MED ORDER — AMLODIPINE BESY-BENAZEPRIL HCL 5-20 MG PO CAPS: 1.0000 | ORAL_CAPSULE | Freq: Every day | ORAL | 2 refills | Status: DC

## 2022-11-14 MED ORDER — NICARDIPINE HCL IN NACL 20-0.86 MG/200ML-% IV SOLN: 3.0000 mg/h | INTRAVENOUS | Status: DC

## 2022-11-14 NOTE — ED Notes (Signed)
[phone] Mario [name] pt wife called for a update.

## 2022-11-14 NOTE — Discharge Instructions (Addendum)
You are seen the emergency room for evaluation of headache and high blood pressure.  There are likely many reasons for your headache today ranging from your dental disease, your witness sleep apnea, and elevated blood pressures.  We strongly encourage you to stop using stimulants such as cocaine as this will worsen your symptoms.  Please contact your primary care doctor to discuss obstructive sleep apnea as this can cause high blood pressure and headaches.  Your MRI showed a possible very small punctate hemorrhage in the back of the brain that may represent a small stroke

## 2022-11-14 NOTE — ED Provider Notes (Signed)
Physical Exam  BP (!) 148/80 (BP Location: Right Arm)   Pulse (!) 108   Temp 98.7 F (37.1 C) (Oral)   Resp 16   Ht 6\' 1"  (1.854 m)   Wt 129.3 kg   SpO2 95%   BMI 37.60 kg/m   Physical Exam Constitutional:      General: He is not in acute distress.    Appearance: Normal appearance.  HENT:     Head: Normocephalic and atraumatic.     Nose: No congestion or rhinorrhea.  Eyes:     General:        Right eye: No discharge.        Left eye: No discharge.     Extraocular Movements: Extraocular movements intact.     Pupils: Pupils are equal, round, and reactive to light.  Cardiovascular:     Rate and Rhythm: Normal rate and regular rhythm.     Heart sounds: No murmur heard. Pulmonary:     Effort: No respiratory distress.     Breath sounds: No wheezing or rales.  Abdominal:     General: There is no distension.     Tenderness: There is no abdominal tenderness.  Musculoskeletal:        General: Normal range of motion.     Cervical back: Normal range of motion.  Skin:    General: Skin is warm and dry.  Neurological:     General: No focal deficit present.     Mental Status: He is alert.     Cranial Nerves: No cranial nerve deficit.     Sensory: No sensory deficit.     Motor: No weakness.     Procedures  Procedures  ED Course / MDM    Medical Decision Making Amount and/or Complexity of Data Reviewed Labs: ordered. Radiology: ordered.  Risk Prescription drug management.   Patient received an handoff.  High blood pressure, headache and dizziness pending CT and MRI.  UDS positive for cocaine and opiates.  Patient states that the last snorted cocaine 2 days ago.  CT angiography with no vessel occlusion or dissection.  MRI brain with a questionable single punctate focus at the right cerebellum that could be a tiny acute ischemic infarct as well as a small remote right cerebellar infarct but is otherwise unremarkable.  Ativan given after discovery of positive UDS which led  to almost immediate significant improvement of the patient's blood pressure.  While the patient was sleeping, he did have noticeable hypoxia into the 80s with snoring and I do suspect an element of sleep apnea may be contributing to his dizziness and headaches.  On my reevaluation, his dizziness and headache have completely resolved and he is able to ambulate with use of his cane.  He does have some mild difficulty with gait and unsteadiness on his feet, but he states that this is baseline for him and has been like this for a very long time.  I did speak with the neurologist on-call Dr. 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  Thus, he can follow-up outpatient  for echocardiogram if symptoms are persistent.  I had a long discussion with the patient about cocaine cessation and he voiced understanding.  He states he  will also reach out to his primary care physician to set up a sleep study as proper OSA management may reduce his stroke risk as well.  I did offer hospital admission but after shared decision-making discussion, patient would like to go home and follow-up outpatient which is not unreasonable.  I did give him strict return precautions which he voiced understanding he was discharged with outpatient follow-up      Glendora Score, MD 11/14/22 [phone]

## 2022-12-14 ENCOUNTER — Emergency Department (HOSPITAL_COMMUNITY): Payer: BC Managed Care – PPO

## 2022-12-14 ENCOUNTER — Encounter (HOSPITAL_COMMUNITY): Payer: Self-pay

## 2022-12-14 ENCOUNTER — Other Ambulatory Visit: Payer: Self-pay

## 2022-12-14 ENCOUNTER — Inpatient Hospital Stay (HOSPITAL_COMMUNITY)
Admission: EM | Admit: 2022-12-14 | Discharge: 2022-12-24 | DRG: 030 | Disposition: A | Discharge status: Rehabilitation Facility | Payer: BC Managed Care – PPO | Attending: Orthopedic Surgery | Admitting: Orthopedic Surgery

## 2022-12-14 DIAGNOSIS — Z6837 Body mass index (BMI) 37.0-37.9, adult: Secondary | ICD-10-CM

## 2022-12-14 DIAGNOSIS — E119 Type 2 diabetes mellitus without complications: Secondary | ICD-10-CM | POA: Diagnosis present

## 2022-12-14 DIAGNOSIS — G9520 Unspecified cord compression: Principal | ICD-10-CM | POA: Diagnosis present

## 2022-12-14 DIAGNOSIS — M5414 Radiculopathy, thoracic region: Secondary | ICD-10-CM | POA: Diagnosis present

## 2022-12-14 DIAGNOSIS — M4714 Other spondylosis with myelopathy, thoracic region: Secondary | ICD-10-CM | POA: Diagnosis present

## 2022-12-14 DIAGNOSIS — Z7984 Long term (current) use of oral hypoglycemic drugs: Secondary | ICD-10-CM

## 2022-12-14 DIAGNOSIS — Z79899 Other long term (current) drug therapy: Secondary | ICD-10-CM

## 2022-12-14 DIAGNOSIS — G992 Myelopathy in diseases classified elsewhere: Secondary | ICD-10-CM | POA: Diagnosis present

## 2022-12-14 DIAGNOSIS — M79661 Pain in right lower leg: Secondary | ICD-10-CM | POA: Diagnosis not present

## 2022-12-14 DIAGNOSIS — F17210 Nicotine dependence, cigarettes, uncomplicated: Secondary | ICD-10-CM | POA: Diagnosis present

## 2022-12-14 DIAGNOSIS — W19XXXA Unspecified fall, initial encounter: Secondary | ICD-10-CM | POA: Diagnosis present

## 2022-12-14 DIAGNOSIS — G959 Disease of spinal cord, unspecified: Secondary | ICD-10-CM

## 2022-12-14 DIAGNOSIS — G8222 Paraplegia, incomplete: Secondary | ICD-10-CM | POA: Diagnosis present

## 2022-12-14 DIAGNOSIS — E669 Obesity, unspecified: Secondary | ICD-10-CM | POA: Diagnosis present

## 2022-12-14 DIAGNOSIS — M545 Low back pain, unspecified: Principal | ICD-10-CM

## 2022-12-14 DIAGNOSIS — I10 Essential (primary) hypertension: Secondary | ICD-10-CM | POA: Diagnosis present

## 2022-12-14 DIAGNOSIS — M4804 Spinal stenosis, thoracic region: Secondary | ICD-10-CM | POA: Diagnosis present

## 2022-12-14 HISTORY — DX: Type 2 diabetes mellitus without complications: E11.9

## 2022-12-14 MED ORDER — OXYCODONE HCL 5 MG PO TABS
5.0000 mg | ORAL_TABLET | Freq: Once | ORAL | Status: AC
  Administered 2022-12-14: 5 mg via ORAL
  Filled 2022-12-14: qty 1

## 2022-12-14 MED ORDER — GABAPENTIN 100 MG PO CAPS
100.0000 mg | ORAL_CAPSULE | Freq: Once | ORAL | Status: AC
  Administered 2022-12-14: 100 mg via ORAL
  Filled 2022-12-14: qty 1

## 2022-12-14 MED ORDER — DEXAMETHASONE SODIUM PHOSPHATE 10 MG/ML IJ SOLN
8.0000 mg | Freq: Once | INTRAMUSCULAR | Status: AC
  Administered 2022-12-14: 8 mg via INTRAVENOUS
  Filled 2022-12-14: qty 1

## 2022-12-14 MED ORDER — METHOCARBAMOL 500 MG PO TABS
500.0000 mg | ORAL_TABLET | Freq: Once | ORAL | Status: AC
  Administered 2022-12-14: 500 mg via ORAL
  Filled 2022-12-14: qty 1

## 2022-12-14 MED ORDER — MORPHINE SULFATE (PF) 4 MG/ML IV SOLN
4.0000 mg | Freq: Once | INTRAVENOUS | Status: AC
  Administered 2022-12-14: 4 mg via INTRAVENOUS
  Filled 2022-12-14: qty 1

## 2022-12-14 NOTE — ED Provider Notes (Signed)
11:43 PM Assumed care from Dr. Earlene Plater, please see their note for full history, physical and decision making until this point. In brief this is a 42 y.o. year old male who presented to the ED tonight with Back Pain     Sees Dr. Yevette Edwards with [facility]. Has known T8 severe stenosis. Has worsening pain, numbness recently. Spoke with Dr. Ave Filter, pending MRI. Working on symptom control pending MRI results and either see in office in AM vs admit for pain control and consult.  Patient required multiple doses of pain medication throughout the night.  Is comfortable in between.  MRI does show some subacute fractures that could be related to pain.  When I went to reevaluate the patient it sounds like it is more general lower extremity weakness has caused him to fall multiple times over the last few days and has progressively worsened. I discussed with Dr. Ave Filter, Dr. Yevette Edwards partner, who recommended continued pain control and they will evaluate for disposition.  Patient family updated.  N.p.o. for now.  Labs, studies and imaging reviewed by myself and considered in medical decision making if ordered. Imaging interpreted by radiology.  Labs Reviewed - No data to display  MR Lumbar Spine Wo Contrast    (Results Pending)  MR THORACIC SPINE WO CONTRAST    (Results Pending)    No follow-ups on file.    Nieshia Larmon, Barbara Cower, MD 12/16/22 [phone]

## 2022-12-14 NOTE — ED Triage Notes (Signed)
Pt had a fall about a month ago. States he needs a lumbar surgery. Unknown date of surgery. States he has pain to the mid back and legs. Was unable to ambulate and when EMS tried to get him up he slid to the ground. Patient is coming from home. He is alert and oriented. No incontinence per EMS.

## 2022-12-14 NOTE — ED Provider Notes (Signed)
[place] EMERGENCY DEPARTMENT AT [facility] Provider Note   CSN: [id number] Arrival date & time: 12/14/22  1938     History  Chief Complaint  Patient presents with   Back Pain    Mario [name] is a 42 y.o. male.   Back Pain 42 year old male history of hypertension presenting for back pain.  Patient states he had a fall sometime ago and was told he had fractures in his spine.  He states he is followed with a surgeon here Dr. Yevette [name], who is supposed to do surgery on him soon.  He states he has had numbness below his waist for some time which he feels like is worsened.  He feels like he has worsened pain in the left side of his back at the thoracolumbar junction, as well as some radicular pain down both legs.  He also has pain in his knees and ankles without acute trauma.  He reports he has a groin numbness for some time, but no bowel or bladder incontinence or retention.  No new trauma.  No fevers or chills or drug use.  He has difficulty walking due to pain and numbness.     Home Medications Prior to Admission medications   Medication Sig Start Date End Date Taking? Authorizing Provider  amLODipine-benazepril (LOTREL) 5-20 MG capsule Take 1 capsule by mouth daily. 11/14/22   [name], [place], MD  amoxicillin-clavulanate (AUGMENTIN) 875-125 MG tablet Take 1 tablet by mouth every 12 (twelve) hours. 11/14/22   [name], Madison, MD  chlorthalidone (HYGROTON) 25 MG tablet Take by mouth. 07/31/20   [provider]  cyclobenzaprine (FLEXERIL) 10 MG tablet Take 1 tablet (10 mg total) by mouth 3 (three) times daily as needed for muscle spasms. 09/20/22   Mario Horseman, PA-C  fenofibrate (TRICOR) 145 MG tablet Take by mouth. 01/10/20   [provider]  ibuprofen (ADVIL) 600 MG tablet Take 1 tablet (600 mg total) by mouth every 6 (six) hours as needed. 03/12/20   [name], Mario Carney R, PA-C  lidocaine (LIDODERM) 5 % Place 1 patch onto the skin daily. Remove & Discard  patch within 12 hours or as directed by MD 09/20/22   Mario Horseman, PA-C  methocarbamol (ROBAXIN) 500 MG tablet Take 1 tablet (500 mg total) by mouth 2 (two) times daily. 09/10/22   [name], Mario N, FNP  nadolol (CORGARD) 20 MG tablet Take 20 mg by mouth daily. 07/19/20   [provider]  predniSONE (DELTASONE) 20 MG tablet Take 2 tablets (40 mg total) by mouth daily. 09/20/22   Mario Horseman, PA-C      Allergies    Patient has no known allergies.    Review of Systems   Review of Systems  Musculoskeletal:  Positive for back pain.  Review of systems completed and notable as per HPI.  ROS otherwise negative.   Physical Exam Updated Vital Signs BP 139/78 (BP Location: Left Arm)   Pulse 98   Temp 98.1 F (36.7 C) (Oral)   Resp 19   SpO2 97%  Physical Exam Vitals and nursing note reviewed.  Constitutional:      General: He is not in acute distress.    Appearance: He is well-developed.  HENT:     Head: Normocephalic and atraumatic.  Eyes:     Conjunctiva/sclera: Conjunctivae normal.  Cardiovascular:     Rate and Rhythm: Normal rate and regular rhythm.     Heart sounds: No murmur heard. Pulmonary:     Effort: Pulmonary effort is  normal. No respiratory distress.     Breath sounds: Normal breath sounds.  Abdominal:     Palpations: Abdomen is soft.     Tenderness: There is no abdominal tenderness. There is no guarding or rebound.  Musculoskeletal:        General: No swelling.     Cervical back: Neck supple.  Skin:    General: Skin is warm and dry.     Capillary Refill: Capillary refill takes less than 2 seconds.  Neurological:     General: No focal deficit present.     Mental Status: He is alert and oriented to person, place, and time. Mental status is at baseline.     Comments: Positive straight leg raise bilaterally.  He has normal sensation in his bilateral lower extremities and groin.  He has pain with palpation over his ankles, knees, legs.  He has no  midline spinal tenderness.  Mild left paraspinal lumbar tenderness.  He has normal sensation in his lower extremities and strength is limited due to pain at the hip, knee, ankle but is able to move antigravity.  Normal reflexes, no clonus.  Palpable DP and PT pulses bilaterally.  Extremities are warm and well-perfused.  He is not able to stand secondary to pain.  Psychiatric:        Mood and Affect: Mood normal.     ED Results / Procedures / Treatments   Labs (all labs ordered are listed, but only abnormal results are displayed) Labs Reviewed - No data to display  EKG None  Radiology No results found.  Procedures Procedures    Medications Ordered in ED Medications  oxyCODONE (Oxy IR/ROXICODONE) immediate release tablet 5 mg (5 mg Oral Given 12/14/22 2037)  morphine (PF) 4 MG/ML injection 4 mg (4 mg Intravenous Given 12/14/22 2333)  dexamethasone (DECADRON) injection 8 mg (8 mg Intravenous Given 12/14/22 2355)  gabapentin (NEURONTIN) capsule 100 mg (100 mg Oral Given 12/14/22 2355)  methocarbamol (ROBAXIN) tablet 500 mg (500 mg Oral Given 12/14/22 2355)    ED Course/ Medical Decision Making/ A&P Clinical Course as of 12/14/22 2357  Sun Dec 14, 2022  2104 Mario [name]: unable to access records, Dumonski in office tomorrow, recommend lumbar MRI [Mario [name]]    Clinical Course User Index [Mario [name]] Mario Spates, MD                                 Medical Decision Making Amount and/or Complexity of Data Reviewed Radiology: ordered.  Risk Prescription drug management.   Medical Decision Making:   Mario [name] is a 42 y.o. male who presented to the ED today with worsening lower extremity numbness, weakness, pain.  Vital signs reviewed.  On exam he is overall well-appearing.  He is quite uncomfortable and some difficulty standing due to pain and weakness.  He has decent strength of the hip, knee, ankle although limited due to pain.  He does report numbness in his groin and  lower extremities which is slightly worsened, although he can feel me when I am examining him.  He has no objective findings of cauda equina syndrome.  No infectious symptoms, low concern for epidural abscess or osteomyelitis.  I discussed the patient with Dr. Ave Filter with [facility].  He was unable to access the patient's records.  Based on the description of his symptoms and presentation, he recommended obtaining MRI to evaluate for acute pathology and trying to control  his symptoms.   I can see his CT myelogram which showed severe stenosis in his thoracic spine he is currently scheduled for thoracic discectomy.  Will obtain MRI of the lumbar and thoracic spine for evaluation.  Medication ordered.  Handoff was given to Dr. Clayborne Dana at 11:30 PM with plan to follow-up MRI and reassess.   Patient's presentation is most consistent with acute complicated illness / injury requiring diagnostic workup.           Final Clinical Impression(s) / ED Diagnoses Final diagnoses:  Low back pain, unspecified back pain laterality, unspecified chronicity, unspecified whether sciatica present    Rx / DC Orders ED Discharge Orders     None         Mario Spates, MD 12/14/22 2357

## 2022-12-15 DIAGNOSIS — S24109D Unspecified injury at unspecified level of thoracic spinal cord, subsequent encounter: Secondary | ICD-10-CM | POA: Diagnosis not present

## 2022-12-15 DIAGNOSIS — Z7984 Long term (current) use of oral hypoglycemic drugs: Secondary | ICD-10-CM | POA: Diagnosis not present

## 2022-12-15 DIAGNOSIS — M79661 Pain in right lower leg: Secondary | ICD-10-CM | POA: Diagnosis not present

## 2022-12-15 DIAGNOSIS — G8220 Paraplegia, unspecified: Secondary | ICD-10-CM | POA: Diagnosis not present

## 2022-12-15 DIAGNOSIS — G959 Disease of spinal cord, unspecified: Secondary | ICD-10-CM | POA: Diagnosis present

## 2022-12-15 DIAGNOSIS — G8222 Paraplegia, incomplete: Secondary | ICD-10-CM | POA: Diagnosis not present

## 2022-12-15 DIAGNOSIS — K592 Neurogenic bowel, not elsewhere classified: Secondary | ICD-10-CM | POA: Diagnosis not present

## 2022-12-15 DIAGNOSIS — G9520 Unspecified cord compression: Secondary | ICD-10-CM | POA: Diagnosis present

## 2022-12-15 DIAGNOSIS — Z79899 Other long term (current) drug therapy: Secondary | ICD-10-CM | POA: Diagnosis not present

## 2022-12-15 DIAGNOSIS — G4700 Insomnia, unspecified: Secondary | ICD-10-CM | POA: Diagnosis not present

## 2022-12-15 DIAGNOSIS — I10 Essential (primary) hypertension: Secondary | ICD-10-CM | POA: Diagnosis not present

## 2022-12-15 DIAGNOSIS — W19XXXA Unspecified fall, initial encounter: Secondary | ICD-10-CM | POA: Diagnosis present

## 2022-12-15 DIAGNOSIS — S24109S Unspecified injury at unspecified level of thoracic spinal cord, sequela: Secondary | ICD-10-CM | POA: Diagnosis not present

## 2022-12-15 DIAGNOSIS — M4804 Spinal stenosis, thoracic region: Secondary | ICD-10-CM | POA: Diagnosis present

## 2022-12-15 DIAGNOSIS — S24103S Unspecified injury at T7-T10 level of thoracic spinal cord, sequela: Secondary | ICD-10-CM | POA: Diagnosis not present

## 2022-12-15 DIAGNOSIS — G992 Myelopathy in diseases classified elsewhere: Secondary | ICD-10-CM | POA: Diagnosis present

## 2022-12-15 DIAGNOSIS — R739 Hyperglycemia, unspecified: Secondary | ICD-10-CM | POA: Diagnosis not present

## 2022-12-15 DIAGNOSIS — E119 Type 2 diabetes mellitus without complications: Secondary | ICD-10-CM | POA: Diagnosis present

## 2022-12-15 DIAGNOSIS — S24103D Unspecified injury at T7-T10 level of thoracic spinal cord, subsequent encounter: Secondary | ICD-10-CM | POA: Diagnosis not present

## 2022-12-15 DIAGNOSIS — M4714 Other spondylosis with myelopathy, thoracic region: Secondary | ICD-10-CM | POA: Diagnosis present

## 2022-12-15 DIAGNOSIS — M5414 Radiculopathy, thoracic region: Secondary | ICD-10-CM | POA: Diagnosis present

## 2022-12-15 DIAGNOSIS — F4323 Adjustment disorder with mixed anxiety and depressed mood: Secondary | ICD-10-CM | POA: Diagnosis not present

## 2022-12-15 DIAGNOSIS — Z6837 Body mass index (BMI) 37.0-37.9, adult: Secondary | ICD-10-CM | POA: Diagnosis not present

## 2022-12-15 DIAGNOSIS — F17210 Nicotine dependence, cigarettes, uncomplicated: Secondary | ICD-10-CM | POA: Diagnosis present

## 2022-12-15 DIAGNOSIS — R252 Cramp and spasm: Secondary | ICD-10-CM | POA: Diagnosis not present

## 2022-12-15 DIAGNOSIS — K5901 Slow transit constipation: Secondary | ICD-10-CM | POA: Diagnosis not present

## 2022-12-15 DIAGNOSIS — E669 Obesity, unspecified: Secondary | ICD-10-CM | POA: Diagnosis present

## 2022-12-15 LAB — RAPID URINE DRUG SCREEN, HOSP PERFORMED
Amphetamines: NOT DETECTED
Barbiturates: NOT DETECTED
Benzodiazepines: NOT DETECTED
Cocaine: POSITIVE — AB
Opiates: POSITIVE — AB
Tetrahydrocannabinol: NOT DETECTED

## 2022-12-15 LAB — CBC WITH DIFFERENTIAL/PLATELET
Abs Immature Granulocytes: 0.07 10*3/uL (ref 0.00–0.07)
Basophils Absolute: 0 10*3/uL (ref 0.0–0.1)
Basophils Relative: 0 %
Eosinophils Absolute: 0 10*3/uL (ref 0.0–0.5)
Eosinophils Relative: 0 %
HCT: 46.9 % (ref 39.0–52.0)
Hemoglobin: 15.5 g/dL (ref 13.0–17.0)
Immature Granulocytes: 1 %
Lymphocytes Relative: 10 %
Lymphs Abs: 1.1 10*3/uL (ref 0.7–4.0)
MCH: 29.8 pg (ref 26.0–34.0)
MCHC: 33 g/dL (ref 30.0–36.0)
MCV: 90.2 fL (ref 80.0–100.0)
Monocytes Absolute: 0.3 10*3/uL (ref 0.1–1.0)
Monocytes Relative: 2 %
Neutro Abs: 9.6 10*3/uL — ABNORMAL HIGH (ref 1.7–7.7)
Neutrophils Relative %: 87 %
Platelets: 351 10*3/uL (ref 150–400)
RBC: 5.2 MIL/uL (ref 4.22–5.81)
RDW: 14.3 % (ref 11.5–15.5)
WBC: 11.1 10*3/uL — ABNORMAL HIGH (ref 4.0–10.5)
nRBC: 0 % (ref 0.0–0.2)

## 2022-12-15 LAB — COMPREHENSIVE METABOLIC PANEL
ALT: 19 U/L (ref 0–44)
AST: 18 U/L (ref 15–41)
Albumin: 3.3 g/dL — ABNORMAL LOW (ref 3.5–5.0)
Alkaline Phosphatase: 68 U/L (ref 38–126)
Anion gap: 10 (ref 5–15)
BUN: 7 mg/dL (ref 6–20)
CO2: 25 mmol/L (ref 22–32)
Calcium: 9.2 mg/dL (ref 8.9–10.3)
Chloride: 99 mmol/L (ref 98–111)
Creatinine, Ser: 0.86 mg/dL (ref 0.61–1.24)
GFR, Estimated: >60 mL/min (ref >60)
Glucose, Bld: 199 mg/dL — ABNORMAL HIGH (ref 70–99)
Potassium: 4.1 mmol/L (ref 3.5–5.1)
Sodium: 134 mmol/L — ABNORMAL LOW (ref 135–145)
Total Bilirubin: 0.9 mg/dL (ref 0.3–1.2)
Total Protein: 7.2 g/dL (ref 6.5–8.1)

## 2022-12-15 LAB — TYPE AND SCREEN
ABO/RH(D): A POS
Antibody Screen: NEGATIVE

## 2022-12-15 MED ORDER — MORPHINE SULFATE (PF) 4 MG/ML IV SOLN
4.0000 mg | Freq: Once | INTRAVENOUS | Status: AC
  Administered 2022-12-15: 4 mg via INTRAVENOUS
  Filled 2022-12-15: qty 1

## 2022-12-15 MED ORDER — CYCLOBENZAPRINE HCL 10 MG PO TABS
10.0000 mg | ORAL_TABLET | Freq: Three times a day (TID) | ORAL | Status: DC | PRN
  Administered 2022-12-15 – 2022-12-18 (×6): 10 mg via ORAL
  Filled 2022-12-15 (×6): qty 1

## 2022-12-15 MED ORDER — METFORMIN HCL ER 500 MG PO TB24
500.0000 mg | ORAL_TABLET | Freq: Every day | ORAL | Status: DC
  Administered 2022-12-17 – 2022-12-24 (×8): 500 mg via ORAL
  Filled 2022-12-15 (×8): qty 1

## 2022-12-15 MED ORDER — OXYCODONE-ACETAMINOPHEN 5-325 MG PO TABS
1.0000 | ORAL_TABLET | ORAL | Status: DC | PRN
  Administered 2022-12-15: 2 via ORAL
  Administered 2022-12-15: 1 via ORAL
  Administered 2022-12-16 – 2022-12-24 (×26): 2 via ORAL
  Filled 2022-12-15: qty 2
  Filled 2022-12-15: qty 1
  Filled 2022-12-15 (×26): qty 2

## 2022-12-15 MED ORDER — ORAL CARE MOUTH RINSE: 15.0000 mL | OROMUCOSAL | Status: DC | PRN

## 2022-12-15 MED ORDER — IRBESARTAN 150 MG PO TABS
75.0000 mg | ORAL_TABLET | Freq: Every day | ORAL | Status: DC
  Administered 2022-12-15 – 2022-12-24 (×10): 75 mg via ORAL
  Filled 2022-12-15 (×10): qty 1

## 2022-12-15 MED ORDER — FENOFIBRATE 160 MG PO TABS
160.0000 mg | ORAL_TABLET | Freq: Every day | ORAL | Status: DC
  Administered 2022-12-15 – 2022-12-24 (×10): 160 mg via ORAL
  Filled 2022-12-15 (×10): qty 1

## 2022-12-15 NOTE — ED Notes (Signed)
Pt is a&ox4, warm and dry to touch. Pt complains of back and leg pain. Pt attached to vitals. Side rails up x 2, call light within reach. Lights out per pt request.

## 2022-12-15 NOTE — Progress Notes (Signed)
PT Cancellation Note  Patient Details Name: Mario [name] MRN: [id number] DOB: 1980/09/29   Cancelled Treatment:    Reason Eval/Treat Not Completed: Medical issues which prohibited therapy. Orthopedics Dr. Ave Filter requesting PT hold evaluation currently. PT will follow up once pt is appropriate to mobilize.   Arlyss Gandy 12/15/2022, 9:16 AM

## 2022-12-15 NOTE — ED Notes (Signed)
ED TO INPATIENT HANDOFF REPORT  ED Nurse Name and Phone #:  Marisue Ivan [phone]  S Name/Age/Gender Mario [name] 42 y.o. male Room/Bed: 007C/007C  Code Status   Code Status: Not on file  Home/SNF/Other Home Patient oriented to: self, place, time, and situation Is this baseline? Yes   Triage Complete: Triage complete  Chief Complaint Myelopathy [facility]) [G95.9]  Triage Note Pt had a fall about a month ago. States he needs a lumbar surgery. Unknown date of surgery. States he has pain to the mid back and legs. Was unable to ambulate and when EMS tried to get him up he slid to the ground. Patient is coming from home. He is alert and oriented. No incontinence per EMS.    Allergies No Known Allergies  Level of Care/Admitting Diagnosis ED Disposition     ED Disposition  Admit   Condition  --   Comment  Hospital Area: [facility] [[id number]]  Level of Care: Med-Surg [16]  May admit patient to [facility] or [facility] if equivalent level of care is available:: No  Covid Evaluation: Asymptomatic - no recent exposure (last 10 days) testing not required  Diagnosis: Myelopathy [facility]) [[id number]]  Admitting Physician: Estill Bamberg [[id number]]  Attending Physician: Estill Bamberg [[id number]]  Bed request comments: 5N  Certification:: I certify this patient will need inpatient services for at least 2 midnights  Expected Medical Readiness: 12/18/2022          B Medical/Surgery History Past Medical History:  Diagnosis Date   Bronchitis    Diverticulitis 10/2015   Hypertension    History reviewed. No pertinent surgical history.   A IV Location/Drains/Wounds Patient Lines/Drains/Airways Status     Active Line/Drains/Airways     Name Placement date Placement time Site Days   Peripheral IV 12/14/22 20 G Left Antecubital 12/14/22  2332  Antecubital  1            Intake/Output Last 24 hours No intake or output data in the 24 hours ending 12/15/22  1152  Labs/Imaging Results for orders placed or performed during the hospital encounter of 12/14/22 (from the past 48 hour(s))  Type and screen [facility]     Status: None   Collection Time: 12/15/22  7:26 AM  Result Value Ref Range   ABO/RH(D) A POS    Antibody Screen NEG    Sample Expiration      12/18/2022,2359 Performed at [facility], [address] [place], [place] [address]   CBC with Differential     Status: Abnormal   Collection Time: 12/15/22  7:28 AM  Result Value Ref Range   WBC 11.1 (H) 4.0 - 10.5 K/uL   RBC 5.20 4.22 - 5.81 MIL/uL   Hemoglobin 15.5 13.0 - 17.0 g/dL   HCT 11.9 14.7 - 82.9 %   MCV 90.2 80.0 - 100.0 fL   MCH 29.8 26.0 - 34.0 pg   MCHC 33.0 30.0 - 36.0 g/dL   RDW 56.2 13.0 - 86.5 %   Platelets 351 150 - 400 K/uL   nRBC 0.0 0.0 - 0.2 %   Neutrophils Relative % 87 %   Neutro Abs 9.6 (H) 1.7 - 7.7 K/uL   Lymphocytes Relative 10 %   Lymphs Abs 1.1 0.7 - 4.0 K/uL   Monocytes Relative 2 %   Monocytes Absolute 0.3 0.1 - 1.0 K/uL   Eosinophils Relative 0 %   Eosinophils Absolute 0.0 0.0 - 0.5 K/uL   Basophils  Relative 0 %   Basophils Absolute 0.0 0.0 - 0.1 K/uL   Immature Granulocytes 1 %   Abs Immature Granulocytes 0.07 0.00 - 0.07 K/uL    Comment: Performed at [facility], [address] [place], [place] [address]  Comprehensive metabolic panel     Status: Abnormal   Collection Time: 12/15/22  7:28 AM  Result Value Ref Range   Sodium 134 (L) 135 - 145 mmol/L   Potassium 4.1 3.5 - 5.1 mmol/L   Chloride 99 98 - 111 mmol/L   CO2 25 22 - 32 mmol/L   Glucose, Bld 199 (H) 70 - 99 mg/dL    Comment: Glucose reference range applies only to samples taken after fasting for at least 8 hours.   BUN 7 6 - 20 mg/dL   Creatinine, Ser 5.78 0.61 - 1.24 mg/dL   Calcium 9.2 8.9 - 46.9 mg/dL   Total Protein 7.2 6.5 - 8.1 g/dL   Albumin 3.3 (L) 3.5 - 5.0 g/dL   AST 18 15 - 41 U/L   ALT 19 0 - 44 U/L   Alkaline Phosphatase 68 38 -  126 U/L   Total Bilirubin 0.9 0.3 - 1.2 mg/dL   GFR, Estimated >62 >95 mL/min    Comment: (NOTE) Calculated using the CKD-EPI Creatinine Equation (2021)    Anion gap 10 5 - 15    Comment: Performed at [facility], [address] [place], [place] [address]  Urine rapid drug screen (hosp performed)     Status: Abnormal   Collection Time: 12/15/22  7:30 AM  Result Value Ref Range   Opiates POSITIVE (A) NONE DETECTED   Cocaine POSITIVE (A) NONE DETECTED   Benzodiazepines NONE DETECTED NONE DETECTED   Amphetamines NONE DETECTED NONE DETECTED   Tetrahydrocannabinol NONE DETECTED NONE DETECTED   Barbiturates NONE DETECTED NONE DETECTED    Comment: (NOTE) DRUG SCREEN FOR MEDICAL PURPOSES ONLY.  IF CONFIRMATION IS NEEDED FOR ANY PURPOSE, NOTIFY LAB WITHIN 5 DAYS.  LOWEST DETECTABLE LIMITS FOR URINE DRUG SCREEN Drug Class                     Cutoff (ng/mL) Amphetamine and metabolites    1000 Barbiturate and metabolites    200 Benzodiazepine                 200 Opiates and metabolites        300 Cocaine and metabolites        300 THC                            50 Performed at [facility], [address], [place], [place] [address]    MR Lumbar Spine Wo Contrast  Result Date: 12/15/2022 CLINICAL DATA:  Low back pain, cauda equina syndrome suspected EXAM: MRI LUMBAR SPINE WITHOUT CONTRAST TECHNIQUE: Multiplanar, multisequence MR imaging of the lumbar spine was performed. No intravenous contrast was administered. COMPARISON:  MRI 10/06/2022 FINDINGS: Segmentation:  5 lumbar type vertebral bodies. Alignment:  No listhesis.  Mild dextrocurvature. Vertebrae: No acute fracture, suspicious osseous lesion, or evidence of discitis. Congenitally short pedicles, which narrow the AP diameter of the spinal canal. Conus medullaris and cauda equina: Conus extends to the L1-L2 level. Conus and cauda equina appear normal. Paraspinal and other soft tissues: Negative. Disc levels: T12-L1: No  significant disc bulge. No spinal canal stenosis or neural foraminal narrowing. L1-L2: No  significant disc bulge. Mild facet arthropathy. No spinal canal stenosis or neural foraminal narrowing. L2-L3: No significant disc bulge. Moderate facet arthropathy. No spinal canal stenosis or neural foraminal narrowing. L3-L4: No significant disc bulge. Moderate facet arthropathy. No spinal canal stenosis. Mild right neural foraminal narrowing. L4-L5: Minimal disc bulge. Severe facet arthropathy. No spinal canal stenosis. Mild bilateral neural foraminal narrowing. L5-S1: No significant disc bulge. Severe left and moderate right facet arthropathy. Right foraminal annular fissure, which can irritate adjacent nerve root. No spinal canal stenosis or neural foraminal narrowing. Again noted are conjoined nerve roots exiting the right neural foramen. IMPRESSION: 1. No spinal canal stenosis. 2. Mild neural foraminal narrowing on the right at L3-L4 and bilaterally at L4-L5. 3. Multilevel facet arthropathy, which is severe at L4-L5 and on the left at L5-S1, which can be a cause of back pain. Electronically Signed   By: Wiliam Ke M.D.   On: 12/15/2022 01:14   MR THORACIC SPINE WO CONTRAST  Result Date: 12/15/2022 CLINICAL DATA:  Falls, numbness from the waist down EXAM: MRI THORACIC SPINE WITHOUT CONTRAST TECHNIQUE: Multiplanar, multisequence MR imaging of the thoracic spine was performed. No intravenous contrast was administered. COMPARISON:  10/06/2022 MRI thoracic spine and 10/27/2022 CT thoracic spine FINDINGS: Alignment: Preservation of the normal thoracic kyphosis. No listhesis. Vertebrae: Redemonstrated increased STIR signal in the bilateral pedicles of T8 and T9. There is now increased T2 signal more anteriorly in the T9 vertebral body (series 4, image 7), with approximately 30% vertebral body height loss anteriorly, eccentric to the left, favored to represent a subacute fracture. Additional increased T2 signal is now  seen at the anterior aspect of T11, with right eccentric vertebral body height loss of approximately 30% (series 3, image 13), which could also represent a subacute fracture. Incidental note is made of partial cervical ribs at C7. Cord:  Normal signal and morphology. Paraspinal and other soft tissues: Negative. Disc levels: Diffuse epidural lipomatosis, which limits the space around the thecal sac at T6-T7, T7-T8, T8-T9, T9-T10, and T10-T11. In addition, at T7-T8 through T10-T11, there is severe facet arthropathy, as seen on the prior exam, which causes severe spinal canal stenosis at T6-T7, T7-T8, and T9-T10, similar to the prior exams. IMPRESSION: 1. Increased T2 signal in the T9 and T11 vertebral bodies, with approximately 30% vertebral body height loss anteriorly, favored to represent subacute fractures. 2. Unchanged increased STIR signal in the bilateral pedicles of T8 and T9, which may represent stress reaction. 3. 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 These results were called by telephone at the time of interpretation on 12/15/2022 at 1:07 am to provider [facility], who verbally acknowledged these results. Electronically Signed   By: Wiliam Ke M.D.   On: 12/15/2022 01:07    Pending Labs Unresulted Labs (From admission, onward)    None       Vitals/Pain Today's Vitals   12/15/22 0722 12/15/22 0818 12/15/22 1117 12/15/22 1152  BP: 132/82     Pulse: 80     Resp: 18     Temp: 97.7 F (36.5 C)  98.4 F (36.9 C)   TempSrc: Oral  Oral   SpO2: 100%     PainSc:  7   5     Isolation Precautions No active isolations  Medications Medications  cyclobenzaprine (FLEXERIL) tablet 10 mg (10 mg Oral Given 12/15/22 1043)  fenofibrate tablet 160 mg (160 mg Oral Given 12/15/22 1049)  metFORMIN (GLUCOPHAGE-XR) 24 hr tablet 500 mg (has  no administration in time range)  irbesartan (AVAPRO) tablet 75 mg (75 mg Oral Given 12/15/22 1049)  oxyCODONE-acetaminophen  (PERCOCET/ROXICET) 5-325 MG per tablet 1-2 tablet (1 tablet Oral Given 12/15/22 1043)  oxyCODONE (Oxy IR/ROXICODONE) immediate release tablet 5 mg (5 mg Oral Given 12/14/22 2037)  morphine (PF) 4 MG/ML injection 4 mg (4 mg Intravenous Given 12/14/22 2333)  dexamethasone (DECADRON) injection 8 mg (8 mg Intravenous Given 12/14/22 2355)  gabapentin (NEURONTIN) capsule 100 mg (100 mg Oral Given 12/14/22 2355)  methocarbamol (ROBAXIN) tablet 500 mg (500 mg Oral Given 12/14/22 2355)  morphine (PF) 4 MG/ML injection 4 mg (4 mg Intravenous Given 12/15/22 0338)  morphine (PF) 4 MG/ML injection 4 mg (4 mg Intravenous Given 12/15/22 0727)    Mobility walks     Focused Assessments Cardiac Assessment Handoff:    No results found for: "CKTOTAL", "CKMB", "CKMBINDEX", "TROPONINI" No results found for: "DDIMER" Does the Patient currently have chest pain? No   , Neuro Assessment Handoff:  Swallow screen pass? Yes          Neuro Assessment:   Neuro Checks:      Has TPA been given? No If patient is a Neuro Trauma and patient is going to OR before floor call report to 4N Charge nurse: [phone] or [phone]   R Recommendations: See Admitting Provider Note  Report given to:   Additional Notes:

## 2022-12-15 NOTE — ED Notes (Signed)
Pt reports last eat/drink was 12/14/22 at 1600.

## 2022-12-15 NOTE — Progress Notes (Signed)
Of note, I did evaluate the patient at his bedside this morning.  He does complain of progressive significant weakness in his bilateral legs.  His ability to ambulate has been compromised significantly, to where he does not feel able to safely ambulate, even with assistance.  On exam, he does continue to have global diffuse weakness throughout his bilateral lower extremities.  We did again discuss the procedure indicated, based on his MRI.  I again discussed once again we discussed in the office on 12/02/2022, a thoracic decompression, spanning T7-T10.  He will be admitted, and made nothing by mouth after midnight tonight.  We will proceed with surgery tomorrow at sometime around 1pm.

## 2022-12-15 NOTE — ED Notes (Signed)
Pt is in MRI  

## 2022-12-15 NOTE — ED Notes (Signed)
Pt requesting pain medication. Unable to reach MD via secure chat. Secretary reaching out via phone.

## 2022-12-15 NOTE — Consult Note (Addendum)
Reason for Consult:Worsening lower extremity weakness Referring Physician: Laquintin Atkison is an 42 y.o. male.  HPI: This is a 16 year old patient of Dr. Yevette Edwards who has been followed for his spine issues.  He was to be scheduled for surgery.  He notes that starting Saturday afternoon he began having worsening weakness to the point where he has been having difficulty getting out of bed to go to the bathroom.  He has not been able to go up and down stairs.  He has no issues with bowel or bladder.  He does note numbness from the umbilicus distal in both legs. He did have repeat MRI of the lumbar and Thoracic spine last night Which shows increased T2 signal in the T9 vertebral body with 30% height loss and increased T2 signal in T11 also with 30% loss concerning for subacute fracture.  He also was noted to have severe spinal canal stenosis from T6-T10.  Past Medical History:  Diagnosis Date   Bronchitis    Diverticulitis 10/2015   Hypertension     History reviewed. No pertinent surgical history.  History reviewed. No pertinent family history.  Social History:  reports that he has been smoking cigarettes. He has never used smokeless tobacco. He reports current alcohol use. He reports that he does not currently use drugs after having used the following drugs: Cocaine.  Allergies: No Known Allergies  Medications: I have reviewed the patient's current medications.  Results for orders placed or performed during the hospital encounter of 12/14/22 (from the past 48 hour(s))  Type and screen [facility]     Status: None (Preliminary result)   Collection Time: 12/15/22  7:26 AM  Result Value Ref Range   ABO/RH(D) PENDING    Antibody Screen PENDING    Sample Expiration      12/18/2022,2359 Performed at [facility], [address], [place], [place] [address]   CBC with Differential     Status: Abnormal   Collection Time: 12/15/22  7:28 AM  Result Value Ref  Range   WBC 11.1 (H) 4.0 - 10.5 K/uL   RBC 5.20 4.22 - 5.81 MIL/uL   Hemoglobin 15.5 13.0 - 17.0 g/dL   HCT 27.2 53.6 - 64.4 %   MCV 90.2 80.0 - 100.0 fL   MCH 29.8 26.0 - 34.0 pg   MCHC 33.0 30.0 - 36.0 g/dL   RDW 03.4 74.2 - 59.5 %   Platelets 351 150 - 400 K/uL   nRBC 0.0 0.0 - 0.2 %   Neutrophils Relative % 87 %   Neutro Abs 9.6 (H) 1.7 - 7.7 K/uL   Lymphocytes Relative 10 %   Lymphs Abs 1.1 0.7 - 4.0 K/uL   Monocytes Relative 2 %   Monocytes Absolute 0.3 0.1 - 1.0 K/uL   Eosinophils Relative 0 %   Eosinophils Absolute 0.0 0.0 - 0.5 K/uL   Basophils Relative 0 %   Basophils Absolute 0.0 0.0 - 0.1 K/uL   Immature Granulocytes 1 %   Abs Immature Granulocytes 0.07 0.00 - 0.07 K/uL    Comment: Performed at [facility], [address] [place], [place] [address]    MR Lumbar Spine Wo Contrast  Result Date: 12/15/2022 CLINICAL DATA:  Low back pain, cauda equina syndrome suspected EXAM: MRI LUMBAR SPINE WITHOUT CONTRAST TECHNIQUE: Multiplanar, multisequence MR imaging of the lumbar spine was performed. No intravenous contrast was administered. COMPARISON:  MRI 10/06/2022 FINDINGS: Segmentation:  5 lumbar type vertebral bodies.  Alignment:  No listhesis.  Mild dextrocurvature. Vertebrae: No acute fracture, suspicious osseous lesion, or evidence of discitis. Congenitally short pedicles, which narrow the AP diameter of the spinal canal. Conus medullaris and cauda equina: Conus extends to the L1-L2 level. Conus and cauda equina appear normal. Paraspinal and other soft tissues: Negative. Disc levels: T12-L1: No significant disc bulge. No spinal canal stenosis or neural foraminal narrowing. L1-L2: No significant disc bulge. Mild facet arthropathy. No spinal canal stenosis or neural foraminal narrowing. L2-L3: No significant disc bulge. Moderate facet arthropathy. No spinal canal stenosis or neural foraminal narrowing. L3-L4: No significant disc bulge. Moderate facet arthropathy. No spinal  canal stenosis. Mild right neural foraminal narrowing. L4-L5: Minimal disc bulge. Severe facet arthropathy. No spinal canal stenosis. Mild bilateral neural foraminal narrowing. L5-S1: No significant disc bulge. Severe left and moderate right facet arthropathy. Right foraminal annular fissure, which can irritate adjacent nerve root. No spinal canal stenosis or neural foraminal narrowing. Again noted are conjoined nerve roots exiting the right neural foramen. IMPRESSION: 1. No spinal canal stenosis. 2. Mild neural foraminal narrowing on the right at L3-L4 and bilaterally at L4-L5. 3. Multilevel facet arthropathy, which is severe at L4-L5 and on the left at L5-S1, which can be a cause of back pain. Electronically Signed   By: Wiliam Ke M.D.   On: 12/15/2022 01:14   MR THORACIC SPINE WO CONTRAST  Result Date: 12/15/2022 CLINICAL DATA:  Falls, numbness from the waist down EXAM: MRI THORACIC SPINE WITHOUT CONTRAST TECHNIQUE: Multiplanar, multisequence MR imaging of the thoracic spine was performed. No intravenous contrast was administered. COMPARISON:  10/06/2022 MRI thoracic spine and 10/27/2022 CT thoracic spine FINDINGS: Alignment: Preservation of the normal thoracic kyphosis. No listhesis. Vertebrae: Redemonstrated increased STIR signal in the bilateral pedicles of T8 and T9. There is now increased T2 signal more anteriorly in the T9 vertebral body (series 4, image 7), with approximately 30% vertebral body height loss anteriorly, eccentric to the left, favored to represent a subacute fracture. Additional increased T2 signal is now seen at the anterior aspect of T11, with right eccentric vertebral body height loss of approximately 30% (series 3, image 13), which could also represent a subacute fracture. Incidental note is made of partial cervical ribs at C7. Cord:  Normal signal and morphology. Paraspinal and other soft tissues: Negative. Disc levels: Diffuse epidural lipomatosis, which limits the space around  the thecal sac at T6-T7, T7-T8, T8-T9, T9-T10, and T10-T11. In addition, at T7-T8 through T10-T11, there is severe facet arthropathy, as seen on the prior exam, which causes severe spinal canal stenosis at T6-T7, T7-T8, and T9-T10, similar to the prior exams. IMPRESSION: 1. Increased T2 signal in the T9 and T11 vertebral bodies, with approximately 30% vertebral body height loss anteriorly, favored to represent subacute fractures. 2. Unchanged increased STIR signal in the bilateral pedicles of T8 and T9, which may represent stress reaction. 3. 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 These results were called by telephone at the time of interpretation on 12/15/2022 at 1:07 am to provider [facility], who verbally acknowledged these results. Electronically Signed   By: Wiliam Ke M.D.   On: 12/15/2022 01:07    Review of Systems  Genitourinary:  Negative for difficulty urinating.  Musculoskeletal:  Positive for back pain.  Neurological:  Positive for weakness and numbness.  All other systems reviewed and are negative.  Blood pressure 132/82, pulse 80, temperature 97.7 F (36.5 C), temperature source Oral, resp. rate 18, SpO2 100%.  Physical Exam Constitutional:      Appearance: He is well-developed.  HENT:     Head: Atraumatic.  Eyes:     Extraocular Movements: Extraocular movements intact.  Cardiovascular:     Pulses: Normal pulses.  Pulmonary:     Effort: Pulmonary effort is normal.  Musculoskeletal:     Comments: He endorses decreased sensation to light touch from umbilicus distal in both legs. Bilateral lower extremities have 4+/5 strength in quadriceps, hamstrings, ankle dorsiflexion, ankle plantar flexion and EPL.  Feet are warm and well-perfused.  Skin:    General: Skin is warm and dry.  Neurological:     Mental Status: He is alert and oriented to person, place, and time.  Psychiatric:        Mood and Affect: Mood normal.     Assessment/Plan: Concern of  worsening neurological symptoms with known severe lower thoracic spinal stenosis and concern of subacute vertebral body fractures at T9 and T11. I will speak with Dr. Yevette Edwards about the change in this patient's status.  I suppose the patient likely need to be admitted and Dr. Yevette Edwards may wish to move up the planned surgery.  Glennon Hamilton 12/15/2022, 8:22 AM

## 2022-12-16 ENCOUNTER — Inpatient Hospital Stay (HOSPITAL_COMMUNITY): Payer: BC Managed Care – PPO | Admitting: Anesthesiology

## 2022-12-16 ENCOUNTER — Other Ambulatory Visit: Payer: Self-pay

## 2022-12-16 ENCOUNTER — Inpatient Hospital Stay (HOSPITAL_COMMUNITY): Payer: BC Managed Care – PPO

## 2022-12-16 ENCOUNTER — Encounter (HOSPITAL_COMMUNITY)
Admission: EM | Disposition: A | Discharge status: Rehabilitation Facility | Payer: Self-pay | Source: Home / Self Care | Attending: Orthopedic Surgery

## 2022-12-16 ENCOUNTER — Encounter (HOSPITAL_COMMUNITY): Payer: Self-pay | Admitting: Orthopedic Surgery

## 2022-12-16 HISTORY — PX: LUMBAR LAMINECTOMY/DECOMPRESSION MICRODISCECTOMY: SHX5026

## 2022-12-16 LAB — SURGICAL PCR SCREEN
MRSA, PCR: NEGATIVE
Staphylococcus aureus: POSITIVE — AB

## 2022-12-16 LAB — ABO/RH: ABO/RH(D): A POS

## 2022-12-16 SURGERY — LUMBAR LAMINECTOMY/DECOMPRESSION MICRODISCECTOMY
Anesthesia: General

## 2022-12-16 MED ORDER — PROPOFOL 10 MG/ML IV BOLUS
INTRAVENOUS | Status: DC | PRN
Start: 2022-12-16 — End: 2022-12-16
  Administered 2022-12-16: 200 mg via INTRAVENOUS

## 2022-12-16 MED ORDER — SUGAMMADEX SODIUM 200 MG/2ML IV SOLN
INTRAVENOUS | Status: DC | PRN
  Administered 2022-12-16: 258.6 mg via INTRAVENOUS

## 2022-12-16 MED ORDER — FENTANYL CITRATE (PF) 250 MCG/5ML IJ SOLN
INTRAMUSCULAR | Status: DC | PRN
  Administered 2022-12-16: 50 ug via INTRAVENOUS
  Administered 2022-12-16: 100 ug via INTRAVENOUS
  Administered 2022-12-16 (×7): 50 ug via INTRAVENOUS

## 2022-12-16 MED ORDER — POTASSIUM CHLORIDE IN NACL 20-0.9 MEQ/L-% IV SOLN: INTRAVENOUS | Status: DC

## 2022-12-16 MED ORDER — ZOLPIDEM TARTRATE 5 MG PO TABS
5.0000 mg | ORAL_TABLET | Freq: Every evening | ORAL | Status: DC | PRN
  Administered 2022-12-21 – 2022-12-23 (×3): 5 mg via ORAL
  Filled 2022-12-16 (×3): qty 1

## 2022-12-16 MED ORDER — THROMBIN 20000 UNITS EX SOLR
CUTANEOUS | Status: DC | PRN
  Administered 2022-12-16: 20 mL via TOPICAL

## 2022-12-16 MED ORDER — SODIUM CHLORIDE 0.9% FLUSH
3.0000 mL | INTRAVENOUS | Status: DC | PRN
  Administered 2022-12-16 – 2022-12-20 (×2): 3 mL via INTRAVENOUS

## 2022-12-16 MED ORDER — ACETAMINOPHEN 325 MG PO TABS
650.0000 mg | ORAL_TABLET | ORAL | Status: DC | PRN
  Administered 2022-12-21: 650 mg via ORAL
  Filled 2022-12-16 (×2): qty 2

## 2022-12-16 MED ORDER — BUPIVACAINE-EPINEPHRINE 0.25% -1:200000 IJ SOLN
INTRAMUSCULAR | Status: DC | PRN
  Administered 2022-12-16: 20 mL

## 2022-12-16 MED ORDER — ALBUMIN HUMAN 5 % IV SOLN: INTRAVENOUS | Status: DC | PRN

## 2022-12-16 MED ORDER — PHENOL 1.4 % MT LIQD: 1.0000 | OROMUCOSAL | Status: DC | PRN

## 2022-12-16 MED ORDER — DEXMEDETOMIDINE HCL IN NACL 80 MCG/20ML IV SOLN
INTRAVENOUS | Status: DC | PRN
  Administered 2022-12-16 (×2): 8 ug via INTRAVENOUS

## 2022-12-16 MED ORDER — CEFAZOLIN SODIUM-DEXTROSE 2-4 GM/100ML-% IV SOLN
2.0000 g | Freq: Three times a day (TID) | INTRAVENOUS | Status: AC
  Administered 2022-12-16 – 2022-12-17 (×2): 2 g via INTRAVENOUS
  Filled 2022-12-16 (×2): qty 100

## 2022-12-16 MED ORDER — 0.9 % SODIUM CHLORIDE (POUR BTL) OPTIME
TOPICAL | Status: DC | PRN
  Administered 2022-12-16 (×2): 1000 mL

## 2022-12-16 MED ORDER — PROPOFOL 10 MG/ML IV BOLUS
INTRAVENOUS | Status: AC
  Filled 2022-12-16: qty 20

## 2022-12-16 MED ORDER — FENTANYL CITRATE (PF) 250 MCG/5ML IJ SOLN
INTRAMUSCULAR | Status: AC
  Filled 2022-12-16: qty 5

## 2022-12-16 MED ORDER — DEXTROSE 5 % IV SOLN
INTRAVENOUS | Status: DC | PRN
  Administered 2022-12-16: 3 g via INTRAVENOUS

## 2022-12-16 MED ORDER — LACTATED RINGERS IV SOLN
INTRAVENOUS | Status: DC | PRN
Start: 2022-12-16 — End: 2022-12-16

## 2022-12-16 MED ORDER — BUPIVACAINE LIPOSOME 1.3 % IJ SUSP
INTRAMUSCULAR | Status: DC | PRN
  Administered 2022-12-16: 20 mL

## 2022-12-16 MED ORDER — ONDANSETRON HCL 4 MG PO TABS: 4.0000 mg | ORAL_TABLET | Freq: Four times a day (QID) | ORAL | Status: DC | PRN

## 2022-12-16 MED ORDER — FENTANYL CITRATE (PF) 100 MCG/2ML IJ SOLN
25.0000 ug | INTRAMUSCULAR | Status: DC | PRN
  Administered 2022-12-16 (×2): 25 ug via INTRAVENOUS

## 2022-12-16 MED ORDER — DEXAMETHASONE SODIUM PHOSPHATE 10 MG/ML IJ SOLN
INTRAMUSCULAR | Status: DC | PRN
  Administered 2022-12-16: 10 mg via INTRAVENOUS

## 2022-12-16 MED ORDER — LIDOCAINE 2% (20 MG/ML) 5 ML SYRINGE
INTRAMUSCULAR | Status: DC | PRN
  Administered 2022-12-16: 60 mg via INTRAVENOUS

## 2022-12-16 MED ORDER — DEXAMETHASONE SODIUM PHOSPHATE 10 MG/ML IJ SOLN
INTRAMUSCULAR | Status: AC
  Filled 2022-12-16: qty 1

## 2022-12-16 MED ORDER — ALUM & MAG HYDROXIDE-SIMETH 200-200-20 MG/5ML PO SUSP: 30.0000 mL | Freq: Four times a day (QID) | ORAL | Status: DC | PRN

## 2022-12-16 MED ORDER — ACETAMINOPHEN 10 MG/ML IV SOLN
INTRAVENOUS | Status: AC
  Filled 2022-12-16: qty 100

## 2022-12-16 MED ORDER — THROMBIN 20000 UNITS EX KIT
PACK | CUTANEOUS | Status: AC
  Filled 2022-12-16: qty 1

## 2022-12-16 MED ORDER — ONDANSETRON HCL 4 MG/2ML IJ SOLN
INTRAMUSCULAR | Status: DC | PRN
  Administered 2022-12-16: 4 mg via INTRAVENOUS

## 2022-12-16 MED ORDER — LACTATED RINGERS IV SOLN: INTRAVENOUS | Status: DC

## 2022-12-16 MED ORDER — SODIUM CHLORIDE 0.9% FLUSH
3.0000 mL | Freq: Two times a day (BID) | INTRAVENOUS | Status: DC
  Administered 2022-12-16 – 2022-12-24 (×13): 3 mL via INTRAVENOUS

## 2022-12-16 MED ORDER — CHLORHEXIDINE GLUCONATE 0.12 % MT SOLN
15.0000 mL | Freq: Once | OROMUCOSAL | Status: AC
  Administered 2022-12-16: 15 mL via OROMUCOSAL

## 2022-12-16 MED ORDER — DOCUSATE SODIUM 100 MG PO CAPS
100.0000 mg | ORAL_CAPSULE | Freq: Two times a day (BID) | ORAL | Status: DC
  Administered 2022-12-16 – 2022-12-24 (×16): 100 mg via ORAL
  Filled 2022-12-16 (×16): qty 1

## 2022-12-16 MED ORDER — HYDROCODONE-ACETAMINOPHEN 5-325 MG PO TABS
1.0000 | ORAL_TABLET | ORAL | Status: DC | PRN
  Administered 2022-12-19 – 2022-12-23 (×11): 2 via ORAL
  Filled 2022-12-16 (×8): qty 2
  Filled 2022-12-16: qty 1
  Filled 2022-12-16 (×4): qty 2

## 2022-12-16 MED ORDER — MORPHINE SULFATE (PF) 2 MG/ML IV SOLN
1.0000 mg | INTRAVENOUS | Status: DC | PRN
  Administered 2022-12-16 – 2022-12-20 (×7): 2 mg via INTRAVENOUS
  Filled 2022-12-16 (×7): qty 1

## 2022-12-16 MED ORDER — FLEET ENEMA RE ENEM
1.0000 | ENEMA | Freq: Once | RECTAL | Status: AC | PRN
  Administered 2022-12-24: 1 via RECTAL
  Filled 2022-12-16: qty 1

## 2022-12-16 MED ORDER — ACETAMINOPHEN 650 MG RE SUPP: 650.0000 mg | RECTAL | Status: DC | PRN

## 2022-12-16 MED ORDER — MENTHOL 3 MG MT LOZG: 1.0000 | LOZENGE | OROMUCOSAL | Status: DC | PRN

## 2022-12-16 MED ORDER — MIDAZOLAM HCL 2 MG/2ML IJ SOLN
INTRAMUSCULAR | Status: DC | PRN
  Administered 2022-12-16: 2 mg via INTRAVENOUS

## 2022-12-16 MED ORDER — MIDAZOLAM HCL 2 MG/2ML IJ SOLN
INTRAMUSCULAR | Status: AC
  Filled 2022-12-16: qty 2

## 2022-12-16 MED ORDER — SUGAMMADEX SODIUM 200 MG/2ML IV SOLN: INTRAVENOUS | Status: DC | PRN

## 2022-12-16 MED ORDER — SENNOSIDES-DOCUSATE SODIUM 8.6-50 MG PO TABS
1.0000 | ORAL_TABLET | Freq: Every evening | ORAL | Status: DC | PRN
  Administered 2022-12-24: 1 via ORAL
  Filled 2022-12-16: qty 1

## 2022-12-16 MED ORDER — FENTANYL CITRATE (PF) 100 MCG/2ML IJ SOLN
INTRAMUSCULAR | Status: AC
  Filled 2022-12-16: qty 2

## 2022-12-16 MED ORDER — BUPIVACAINE-EPINEPHRINE (PF) 0.25% -1:200000 IJ SOLN
INTRAMUSCULAR | Status: AC
  Filled 2022-12-16: qty 30

## 2022-12-16 MED ORDER — SODIUM CHLORIDE 0.9 % IV SOLN: 250.0000 mL | INTRAVENOUS | Status: DC

## 2022-12-16 MED ORDER — ACETAMINOPHEN 10 MG/ML IV SOLN
INTRAVENOUS | Status: DC | PRN
  Administered 2022-12-16: 1000 mg via INTRAVENOUS

## 2022-12-16 MED ORDER — AMISULPRIDE (ANTIEMETIC) 5 MG/2ML IV SOLN: 10.0000 mg | Freq: Once | INTRAVENOUS | Status: DC | PRN

## 2022-12-16 MED ORDER — ROCURONIUM BROMIDE 10 MG/ML (PF) SYRINGE
PREFILLED_SYRINGE | INTRAVENOUS | Status: DC | PRN
  Administered 2022-12-16: 20 mg via INTRAVENOUS
  Administered 2022-12-16 (×2): 30 mg via INTRAVENOUS
  Administered 2022-12-16: 40 mg via INTRAVENOUS
  Administered 2022-12-16: 60 mg via INTRAVENOUS

## 2022-12-16 MED ORDER — BISACODYL 5 MG PO TBEC
5.0000 mg | DELAYED_RELEASE_TABLET | Freq: Every day | ORAL | Status: DC | PRN
  Administered 2022-12-21 – 2022-12-23 (×2): 5 mg via ORAL
  Filled 2022-12-16 (×2): qty 1

## 2022-12-16 MED ORDER — PHENYLEPHRINE HCL-NACL 20-0.9 MG/250ML-% IV SOLN
INTRAVENOUS | Status: DC | PRN
  Administered 2022-12-16: 25 ug/min via INTRAVENOUS

## 2022-12-16 MED ORDER — CEFAZOLIN SODIUM-DEXTROSE 2-4 GM/100ML-% IV SOLN
INTRAVENOUS | Status: AC
  Filled 2022-12-16: qty 200

## 2022-12-16 MED ORDER — ORAL CARE MOUTH RINSE: 15.0000 mL | Freq: Once | OROMUCOSAL | Status: AC

## 2022-12-16 MED ORDER — BUPIVACAINE LIPOSOME 1.3 % IJ SUSP
INTRAMUSCULAR | Status: AC
  Filled 2022-12-16: qty 20

## 2022-12-16 MED ORDER — ONDANSETRON HCL 4 MG/2ML IJ SOLN: 4.0000 mg | Freq: Four times a day (QID) | INTRAMUSCULAR | Status: DC | PRN

## 2022-12-16 SURGICAL SUPPLY — 74 items
AGENT HMST KT MTR STRL THRMB (HEMOSTASIS) ×1
APL SKNCLS STERI-STRIP NONHPOA (GAUZE/BANDAGES/DRESSINGS) ×1
BAG COUNTER SPONGE SURGICOUNT (BAG) ×1 IMPLANT
BAG SPNG CNTER NS LX DISP (BAG) ×1
BENZOIN TINCTURE PRP APPL 2/3 (GAUZE/BANDAGES/DRESSINGS) IMPLANT
BNDG GAUZE DERMACEA FLUFF 4 (GAUZE/BANDAGES/DRESSINGS) ×1 IMPLANT
BNDG GZE DERMACEA 4 6PLY (GAUZE/BANDAGES/DRESSINGS)
BUR PRESCISION 1.7 ELITE (BURR) IMPLANT
BUR ROUND PRECISION 4.0 (BURR) ×1 IMPLANT
CABLE BIPOLOR RESECTION CORD (MISCELLANEOUS) ×1 IMPLANT
CANISTER SUCT 3000ML PPV (MISCELLANEOUS) ×1 IMPLANT
COVER SURGICAL LIGHT HANDLE (MISCELLANEOUS) ×1 IMPLANT
DRAIN CHANNEL 15F RND FF W/TCR (WOUND CARE) IMPLANT
DRAIN RELI 100 BL SUC LF ST (DRAIN) ×1
DRAPE POUCH INSTRU U-SHP 10X18 (DRAPES) ×2 IMPLANT
DRAPE SURG 17X23 STRL (DRAPES) ×4 IMPLANT
DURAPREP 26ML APPLICATOR (WOUND CARE) ×1 IMPLANT
ELECT BLADE 4.0 EZ CLEAN MEGAD (MISCELLANEOUS) ×1
ELECT CAUTERY BLADE 6.4 (BLADE) ×1 IMPLANT
ELECT REM PT RETURN 9FT ADLT (ELECTROSURGICAL) ×1
ELECTRODE BLDE 4.0 EZ CLN MEGD (MISCELLANEOUS) ×1 IMPLANT
ELECTRODE REM PT RTRN 9FT ADLT (ELECTROSURGICAL) ×1 IMPLANT
EVACUATOR SILICONE 100CC (DRAIN) IMPLANT
FILTER STRAW FLUID ASPIR (MISCELLANEOUS) ×1 IMPLANT
GAUZE 4X4 16PLY ~~LOC~~+RFID DBL (SPONGE) ×2 IMPLANT
GAUZE SPONGE 4X4 12PLY STRL (GAUZE/BANDAGES/DRESSINGS) ×1 IMPLANT
GLOVE BIO SURGEON STRL SZ 6.5 (GLOVE) ×1 IMPLANT
GLOVE BIO SURGEON STRL SZ8 (GLOVE) ×1 IMPLANT
GLOVE BIOGEL PI IND STRL 7.0 (GLOVE) ×1 IMPLANT
GLOVE BIOGEL PI IND STRL 8 (GLOVE) ×1 IMPLANT
GLOVE SURG ENC MOIS LTX SZ6.5 (GLOVE) ×1 IMPLANT
GOWN STRL REUS W/ TWL LRG LVL3 (GOWN DISPOSABLE) ×1 IMPLANT
GOWN STRL REUS W/ TWL XL LVL3 (GOWN DISPOSABLE) ×2 IMPLANT
GOWN STRL REUS W/TWL LRG LVL3 (GOWN DISPOSABLE) ×1
GOWN STRL REUS W/TWL XL LVL3 (GOWN DISPOSABLE) ×2
IV CATH 14GX2 1/4 (CATHETERS) ×1 IMPLANT
KIT BASIN OR (CUSTOM PROCEDURE TRAY) ×1 IMPLANT
KIT POSITION SURG JACKSON T1 (MISCELLANEOUS) ×1 IMPLANT
KIT TURNOVER KIT B (KITS) ×1 IMPLANT
MARKER SKIN DUAL TIP RULER LAB (MISCELLANEOUS) ×1 IMPLANT
NDL 18GX1X1/2 (RX/OR ONLY) (NEEDLE) ×1 IMPLANT
NDL 22X1.5 STRL (OR ONLY) (MISCELLANEOUS) ×1 IMPLANT
NDL HYPO 25GX1X1/2 BEV (NEEDLE) ×1 IMPLANT
NDL SPNL 18GX3.5 QUINCKE PK (NEEDLE) ×2 IMPLANT
NEEDLE 18GX1X1/2 (RX/OR ONLY) (NEEDLE) ×1 IMPLANT
NEEDLE 22X1.5 STRL (OR ONLY) (MISCELLANEOUS) ×1 IMPLANT
NEEDLE HYPO 25GX1X1/2 BEV (NEEDLE) ×1 IMPLANT
NEEDLE SPNL 18GX3.5 QUINCKE PK (NEEDLE) ×2 IMPLANT
NS IRRIG 1000ML POUR BTL (IV SOLUTION) ×1 IMPLANT
PACK LAMINECTOMY ORTHO (CUSTOM PROCEDURE TRAY) ×1 IMPLANT
PACK UNIVERSAL I (CUSTOM PROCEDURE TRAY) ×1 IMPLANT
PAD ARMBOARD 7.5X6 YLW CONV (MISCELLANEOUS) ×2 IMPLANT
PATTIES SURGICAL .5 X.5 (GAUZE/BANDAGES/DRESSINGS) IMPLANT
PATTIES SURGICAL .5 X1 (DISPOSABLE) ×1 IMPLANT
SPONGE INTESTINAL PEANUT (DISPOSABLE) ×1 IMPLANT
SPONGE SURGIFOAM ABS GEL SZ50 (HEMOSTASIS) ×1 IMPLANT
STRIP CLOSURE SKIN 1/2X4 (GAUZE/BANDAGES/DRESSINGS) IMPLANT
STRIP CLOSURE SKIN 1/4X3 (GAUZE/BANDAGES/DRESSINGS) IMPLANT
SURGIFLO W/THROMBIN 8M KIT (HEMOSTASIS) IMPLANT
SUT MNCRL AB 4-0 PS2 18 (SUTURE) ×1 IMPLANT
SUT VIC AB 0 CT1 18XCR BRD 8 (SUTURE) IMPLANT
SUT VIC AB 0 CT1 8-18 (SUTURE)
SUT VIC AB 1 CT1 18XCR BRD 8 (SUTURE) ×1 IMPLANT
SUT VIC AB 1 CT1 8-18 (SUTURE) ×2
SUT VIC AB 2-0 CT2 18 VCP726D (SUTURE) ×1 IMPLANT
SYR 20ML LL LF (SYRINGE) ×1 IMPLANT
SYR BULB IRRIG 60ML STRL (SYRINGE) ×1 IMPLANT
SYR CONTROL 10ML LL (SYRINGE) ×2 IMPLANT
SYR TB 1ML LUER SLIP (SYRINGE) ×4 IMPLANT
TAPE CLOTH SURG 6X10 WHT LF (GAUZE/BANDAGES/DRESSINGS) IMPLANT
TOWEL GREEN STERILE (TOWEL DISPOSABLE) ×1 IMPLANT
TOWEL GREEN STERILE FF (TOWEL DISPOSABLE) ×1 IMPLANT
WATER STERILE IRR 1000ML POUR (IV SOLUTION) ×1 IMPLANT
YANKAUER SUCT BULB TIP NO VENT (SUCTIONS) ×1 IMPLANT

## 2022-12-16 NOTE — Progress Notes (Signed)
CCC Pre-op Review  Pre-op checklist: asked floor RN to complete  NPO: yes, since MN 8/27  Labs: WNL  Consent: completed  H&P: 8/26-consult note chandler  Vitals: WNL  O2 requirements: 95% RA  MAR/PTA review: completed  IV: 20G LAC  Floor nurse name:  Lelon Mast RN 5N  Additional info:

## 2022-12-16 NOTE — Progress Notes (Signed)
Patient off floor to OR

## 2022-12-16 NOTE — Anesthesia Procedure Notes (Signed)
Procedure Name: Intubation Date/Time: 12/16/2022 1:49 PM  Performed by: Shary Decamp, CRNAPre-anesthesia Checklist: Patient identified, Patient being monitored, Timeout performed, Emergency Drugs available and Suction available Patient Re-evaluated:Patient Re-evaluated prior to induction Oxygen Delivery Method: Circle System Utilized Preoxygenation: Pre-oxygenation with 100% oxygen Induction Type: IV induction Ventilation: Two handed mask ventilation required Laryngoscope Size: Mac, 4 and Glidescope Grade View: Grade I Tube type: Oral Tube size: 8.0 mm Number of attempts: 1 Airway Equipment and Method: Video-laryngoscopy and Rigid stylet Placement Confirmation: ETT inserted through vocal cords under direct vision, positive ETCO2 and breath sounds checked- equal and bilateral Secured at: 22 cm Tube secured with: Tape Dental Injury: Teeth and Oropharynx as per pre-operative assessment

## 2022-12-16 NOTE — Anesthesia Postprocedure Evaluation (Signed)
Anesthesia Post Note  Patient: Mario [name]  Procedure(s) Performed: THORACIC DECOMPRESSION     Patient location during evaluation: PACU Anesthesia Type: General Level of consciousness: awake and alert Pain management: pain level controlled Vital Signs Assessment: post-procedure vital signs reviewed and stable Respiratory status: spontaneous breathing, nonlabored ventilation, respiratory function stable and patient connected to nasal cannula oxygen Cardiovascular status: blood pressure returned to baseline and stable Postop Assessment: no apparent nausea or vomiting Anesthetic complications: no   No notable events documented.  Last Vitals:  Vitals:   12/16/22 1800 12/16/22 1815  BP: (!) 141/80 136/74  Pulse: (!) 106 99  Resp: 18 12  Temp:    SpO2: 93% 93%    Last Pain:  Vitals:   12/16/22 1800  TempSrc:   PainSc: Asleep                 Montgomery Nation

## 2022-12-16 NOTE — Plan of Care (Signed)

## 2022-12-16 NOTE — Transfer of Care (Signed)
Immediate Anesthesia Transfer of Care Note  Patient: Mario [name]  Procedure(s) Performed: THORACIC DECOMPRESSION  Patient Location: PACU  Anesthesia Type:General  Level of Consciousness: awake and alert   Airway & Oxygen Therapy: Patient Spontanous Breathing  Post-op Assessment: Report given to RN and Post -op Vital signs reviewed and stable  Post vital signs: Reviewed and stable  Last Vitals:  Vitals Value Taken Time  BP 138/81 12/16/22 1754  Temp 36.7 C 12/16/22 1754  Pulse 107 12/16/22 1758  Resp 19 12/16/22 1758  SpO2 92 % 12/16/22 1758  Vitals shown include unfiled device data.  Last Pain:  Vitals:   12/16/22 1227  TempSrc: Oral  PainSc:          Complications: No notable events documented.

## 2022-12-16 NOTE — Anesthesia Preprocedure Evaluation (Signed)
Anesthesia Evaluation  Patient identified by MRN, date of birth, ID band Patient awake    Reviewed: Allergy & Precautions, NPO status , Patient's Chart, lab work & pertinent test results  Airway Mallampati: II  TM Distance: >3 FB Neck ROM: Full    Dental  (+) Dental Advisory Given   Pulmonary Current Smoker   breath sounds clear to auscultation       Cardiovascular hypertension, Pt. on medications  Rhythm:Regular Rate:Normal     Neuro/Psych negative neurological ROS     GI/Hepatic negative GI ROS,,,(+)     substance abuse  cocaine use  Endo/Other  negative endocrine ROS    Renal/GU negative Renal ROS     Musculoskeletal  (+)  narcotic dependent  Abdominal   Peds  Hematology negative hematology ROS (+)   Anesthesia Other Findings   Reproductive/Obstetrics                             Anesthesia Physical Anesthesia Plan  ASA: 3  Anesthesia Plan: General   Post-op Pain Management: Ofirmev IV (intra-op)* and Ketamine IV*   Induction: Intravenous  PONV Risk Score and Plan: 1 and Dexamethasone, Ondansetron and Treatment may vary due to age or medical condition  Airway Management Planned: Oral ETT  Additional Equipment:   Intra-op Plan:   Post-operative Plan: Extubation in OR  Informed Consent: I have reviewed the patients History and Physical, chart, labs and discussed the procedure including the risks, benefits and alternatives for the proposed anesthesia with the patient or authorized representative who has indicated his/her understanding and acceptance.     Dental advisory given  Plan Discussed with: CRNA  Anesthesia Plan Comments:        Anesthesia Quick Evaluation

## 2022-12-16 NOTE — Plan of Care (Signed)

## 2022-12-16 NOTE — Progress Notes (Signed)
Report called to short stay. 

## 2022-12-16 NOTE — Op Note (Addendum)
PATIENT NAME: Mario [name]   MEDICAL RECORD NO.:   [id number]    DATE OF BIRTH: [birth year]   DATE OF PROCEDURE: 12/16/2022                               OPERATIVE REPORT   PREOPERATIVE DIAGNOSES: 1.  Progressive thoracic myelopathy 2.  Progressive bilateral leg weakness  POSTOPERATIVE DIAGNOSES: 1.  Progressive thoracic myelopathy 2.  Progressive bilateral leg weakness  PROCEDURE:  T7/8, T8/9, T9/10 laminectomy with bilateral partial facetectomy and spinal cord decompression  SURGEON:  Estill Bamberg, MD.  ASSISTANTJason Coop, PA-C.  ANESTHESIA:  General endotracheal anesthesia.  COMPLICATIONS:  None.  DISPOSITION:  Stable.  ESTIMATED BLOOD LOSS:  500cc  INDICATIONS FOR SURGERY:  Briefly, Mario [name] is a pleasant 42 year-old male, who did initially present to me in the office about 6 weeks ago, with dysesthesias and weakness into his bilateral legs.  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  He was signed up for surgery, but presented to the emergency department 2 nights ago with an inability to ambulate.  He did describe profound weakness in his bilateral legs, and did not feel able to walk.  Given this, he was admitted to the hospital for urgent surgery today.  OPERATIVE DETAILS:  On 12/16/2022, the patient was brought to surgery and general endotracheal anesthesia was administered.  The patient was placed prone on a well-padded flat Jackson bed with a spinal frame. Antibiotics were given and the back was prepped and draped in the usual sterile fashion.  A time-out procedure was performed.  At this point, a midline incision was made centered over the T7-T10 regions.  Fluoroscopy was brought into the field in order to confirm the appropriate operative levels.  The T9/10 level was identified.  At this point, the T7, T8, and T9 spinous processes were removed.  I then turned my attention to the T9-10 segment.  I used a high-speed  bur to remove the lamina centrally.  I then used Kerrison punches to remove the ligamentum flavum more laterally.  In this fashion, the spinal cord was entirely decompressed at the T9-10 segment.  I then used a high-speed bur to carry the decompression more cephalad.  In a similar fashion, the T8-9 level was decompressed, followed by the T7-8 level.  As described at T9-10, a high-speed bur was utilized, followed by Kerrison punches.  At the termination of this portion of the procedure, the entire spinal cord was decompressed posteriorly, from T7-T10.  Of note, the spinal canal at the operative levels was extremely stenotic.  I did take extra care and diligence to ensure that there was no pressure or manipulation of the spinal cord throughout the entire surgery.  Gelfoam was placed over the laminectomy site.  I was very pleased with the decompression.  There was no extravasation of cerebrospinal fluid noted throughout the entire surgery.  At this point, the wound was closed in layers using #1 Vicryl, followed by 2-0 Vicryl, followed by 4- 0 Monocryl.  Benzoin and Steri-Strips were applied, followed by a sterile dressing.  All instrument counts were correct at the termination of the procedure.  Of note, Jason Coop, PA-C, was my assistant throughout surgery, and did aid in retraction, suctioning, and closure from start to finish.   Estill Bamberg, MD

## 2022-12-16 NOTE — H&P (Signed)
PREOPERATIVE H&P  Chief Complaint: Progressive lower extremity weakness  HPI: Mario [name] is a 42 y.o. male who presents with progressive weakness in the bilateral legs. He was last seen in the office on 12/02/2022.  At that point in time, we did discuss proceeding with a decompressive procedure.  He was scheduled for surgery, but did present to the emergency department 2 nights ago, with progressive weakness in his legs.  He was unable to ambulate.  He was admitted for urgent surgery, specifically, a decompression at his stenotic levels, spanning T7-T10.  MRI reveals spinal stenosis, including cord compression, spanning T7-T10.   Patient has failed multiple forms of conservative care and continues to have pain (see office notes for additional details regarding the patient's full course of treatment)  Past Medical History:  Diagnosis Date   Bronchitis    Diverticulitis 10/2015   Hypertension    History reviewed. No pertinent surgical history. Social History   Socioeconomic History   Marital status: Married    Spouse name: Not on file   Number of children: Not on file   Years of education: Not on file   Highest education level: Not on file  Occupational History   Not on file  Tobacco Use   Smoking status: Every Day    Current packs/day: 0.30    Types: Cigarettes   Smokeless tobacco: Never  Vaping Use   Vaping status: Never Used  Substance and Sexual Activity   Alcohol use: Yes    Comment: occasionally   Drug use: Not Currently    Types: Cocaine   Sexual activity: Not on file  Other Topics Concern   Not on file  Social History Narrative   ** Merged History Encounter **       Social Determinants of Health   Financial Resource Strain: Not on file  Food Insecurity: No Food Insecurity (12/15/2022)   Hunger Vital Sign    Worried About Running Out of Food in the Last Year: Never true    Ran Out of Food in the Last Year: Never true  Transportation Needs: No  Transportation Needs (12/15/2022)   PRAPARE - Administrator, Civil Service (Medical): No    Lack of Transportation (Non-Medical): No  Physical Activity: Not on file  Stress: Not on file  Social Connections: Not on file   History reviewed. No pertinent family history. No Known Allergies Prior to Admission medications   Medication Sig Start Date End Date Taking? Authorizing Provider  acetaminophen (TYLENOL) 650 MG CR tablet Take 1,300 mg by mouth 2 (two) times daily as needed for pain.   Yes [provider]  cyclobenzaprine (FLEXERIL) 10 MG tablet Take 1 tablet (10 mg total) by mouth 3 (three) times daily as needed for muscle spasms. Patient taking differently: Take 10 mg by mouth 2 (two) times daily as needed for muscle spasms. 09/20/22  Yes Roxy Horseman, PA-C  metFORMIN (GLUCOPHAGE-XR) 500 MG 24 hr tablet Take 1 tablet by mouth daily with breakfast. 10/02/22 10/02/23 Yes [provider]  valsartan (DIOVAN) 80 MG tablet Take 1 tablet by mouth daily. 10/01/22  Yes [provider]  amLODipine-benazepril (LOTREL) 5-20 MG capsule Take 1 capsule by mouth daily. Patient not taking: Reported on 12/15/2022 11/14/22   Kommor, Wyn Forster, MD  amoxicillin-clavulanate (AUGMENTIN) 875-125 MG tablet Take 1 tablet by mouth every 12 (twelve) hours. Patient not taking: Reported on 12/15/2022 11/14/22   Kommor, Wyn Forster, MD  fenofibrate (TRICOR) 145 MG tablet Take  by mouth. Patient not taking: Reported on 12/15/2022 01/10/20   [provider]  lidocaine (LIDODERM) 5 % Place 1 patch onto the skin daily. Remove & Discard patch within 12 hours or as directed by MD Patient not taking: Reported on 12/15/2022 09/20/22   Roxy Horseman, PA-C  methocarbamol (ROBAXIN) 500 MG tablet Take 1 tablet (500 mg total) by mouth 2 (two) times daily. Patient not taking: Reported on 12/15/2022 09/10/22   Garrison, [place] N, FNP     All other systems have been reviewed and were otherwise  negative with the exception of those mentioned in the HPI and as above.  Physical Exam: Vitals:   12/16/22 0337 12/16/22 0745  BP: 128/79 106/63  Pulse: 68 63  Resp: 16 18  Temp: 97.7 F (36.5 C) 97.6 F (36.4 C)  SpO2: 94% 95%    Body mass index is 37.6 kg/m.  General: Alert, no acute distress Cardiovascular: No pedal edema Respiratory: No cyanosis, no use of accessory musculature Skin: No lesions in the area of chief complaint Neurologic: Sensation intact distally Psychiatric: Patient is competent for consent with normal mood and affect Lymphatic: No axillary or cervical lymphadenopathy  MUSCULOSKELETAL: Patient does have diffuse weakness throughout his bilateral lower extremities. He is unable to ambulate.  Assessment/Plan: THORACIC MYELOPATHY, resulting in progressive lower extremity weakness.  The patient is currently unable to ambulate.  Plan for Procedure(s): THORACIC DECOMPRESSION, T7-T10   Jackelyn Hoehn, MD 12/16/2022 12:22 PM

## 2022-12-16 NOTE — Progress Notes (Signed)
    Patient just coming out of surgery. Surgery went as planned. A drain is in place. Empty, record, and recharge every 2hrs daytime, and Q4hrs nighttime for first 48hrs or until slows to less than 10 per 2hr increment. Please help log roll/change pt position 2x per shift as pt currently unable to ambulate on his own. Thank you  Jason Coop, PA-C Spine Surgery  Spine Surgery

## 2022-12-17 ENCOUNTER — Encounter (HOSPITAL_COMMUNITY): Payer: Self-pay | Admitting: Orthopedic Surgery

## 2022-12-17 MED ORDER — CHLORPROMAZINE HCL 25 MG PO TABS
25.0000 mg | ORAL_TABLET | Freq: Once | ORAL | Status: AC
  Administered 2022-12-17: 25 mg via ORAL
  Filled 2022-12-17: qty 1

## 2022-12-17 MED FILL — Thrombin For Soln Kit 20000 Unit: CUTANEOUS | Qty: 1 | Status: AC

## 2022-12-17 NOTE — Progress Notes (Signed)
Inpatient Rehab Admissions Coordinator Note:   Per therapy recommendations patient was screened for CIR candidacy by Stephania Fragmin, PT. At this time, pt appears to be a potential candidate for CIR. I will place an order for rehab consult for full assessment, per our protocol.  Please contact me any with questions.Estill Dooms, PT, DPT [phone] 12/17/22 3:04 PM

## 2022-12-17 NOTE — Evaluation (Signed)
Physical Therapy Evaluation Patient Details Name: Mario [name] MRN: [id number] DOB: [birth year] Today's Date: 12/17/2022  History of Present Illness  Mario [name] is a 42 y.o. 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  Clinical Impression   Pt admitted with above diagnosis. Lives at home with his wife, in a two-level home with level entry Prior to admission, pt was independent with mobility and ADLs until onset of bil LE weakness in May of this year; since then, he has needed more assist, and began using RW; Presents to PT with Bil LE weakness effecting functional mobility, pain;  Needs heavy mod assist to roll and max asssit to come to sitting EOB; Needs Mod assist of 2 to stand and 2 person assist to walk a few feet with RW; Motivated to get back to independence; has solid assist at home; Patient will benefit from intensive inpatient follow up therapy, >3 hours/day; Pt currently with functional limitations due to the deficits listed below (see PT Problem List). Pt will benefit from skilled PT to increase their independence and safety with mobility to allow discharge to the venue listed below.           If plan is discharge home, recommend the following: A lot of help with walking and/or transfers;A lot of help with bathing/dressing/bathroom   Can travel by private vehicle        Equipment Recommendations Rolling walker (2 wheels);BSC/3in1  Recommendations for Other Services  Other (comment) (Mobility)    Functional Status Assessment Patient has had a recent decline in their functional status and demonstrates the ability to make significant improvements in function in a reasonable and predictable amount of time.     Precautions / Restrictions Precautions Precautions: Back Precaution Comments: Educated  pt in back prec Restrictions Weight Bearing Restrictions: No      Mobility  Bed Mobility Overal bed mobility: Needs Assistance Bed Mobility: Rolling, Sidelying to Sit Rolling: Mod assist Sidelying to sit: Mod assist (near Max)       General bed mobility comments: Mod assist to fully roll into sidelying; very heavy mod assist to elevate trunk to sitting    Transfers Overall transfer level: Needs assistance Equipment used: Standard walker Transfers: Sit to/from Stand, Bed to chair/wheelchair/BSC Sit to Stand: Mod assist, +2 physical assistance, +2 safety/equipment           General transfer comment: Stood from EOB slightly elevated 2 person Mod assist to power up    Ambulation/Gait Ambulation/Gait assistance: Min assist, +2 physical assistance, +2 safety/equipment (chair follow) Gait Distance (Feet): 5 Feet Assistive device: Rolling walker (2 wheels) Gait Pattern/deviations: Decreased step length - right, Decreased step length - left Gait velocity: slow     General Gait Details: Short steps with heavy dependence on UE support from RW; Bil LEs showed genu recurvatum in stance, and depending on ligaments/posterior elements of knees for stance stability  Stairs            Wheelchair Mobility     Tilt Bed    Modified Rankin (Stroke Patients Only)       Balance Overall balance assessment: Needs assistance Sitting-balance support: Feet supported Sitting balance-Leahy Scale: Good     Standing balance support: Bilateral upper extremity supported, Reliant on assistive device for balance Standing balance-Leahy Scale: Poor  Pertinent Vitals/Pain Pain Assessment Pain Location: back Pain Descriptors / Indicators: Aching    Home Living Family/patient expects to be discharged to:: Private residence Living Arrangements: Spouse/significant other Available Help at Discharge: Family;Available 24 hours/day (at or near 24  hours) Type of Home: Apartment Home Access: Stairs to enter (need clarifiacation, may be a level entry) Entrance Stairs-Rails: None Entrance Stairs-Number of Steps: 1 Alternate Level Stairs-Number of Steps: Flight Home Layout: Two level;1/2 bath on main level;Bed/bath upstairs        Prior Function Prior Level of Function : Independent/Modified Independent               ADLs Comments:  (patient reporting since June, his wife has been having to help him with LB ADLs. Prior to this was Independent in ADLs and driving.  Wife took care of IADLs)     Extremity/Trunk Assessment   Upper Extremity Assessment Upper Extremity Assessment: Defer to OT evaluation    Lower Extremity Assessment Lower Extremity Assessment: RLE deficits/detail;LLE deficits/detail RLE Deficits / Details: Grossly 3/5 throughout; noteworthy for quad weakness evidenced by tendency for genu recurvatum in stance, and depending on ligaments/posterior elements of knees for stance stability; difficulty with mini-squats with UE support on RW LLE Deficits / Details: Grossly 3/5 throughout; noteworthy for quad weakness evidenced by tendency for genu recurvatum in stance, and depending on ligaments/posterior elements of knees for stance stability; difficulty with mini-squats with UE support on RW    Cervical / Trunk Assessment Cervical / Trunk Assessment: Back Surgery (T spine)  Communication   Communication Communication: No apparent difficulties  Cognition Arousal: Alert Behavior During Therapy: WFL for tasks assessed/performed Overall Cognitive Status: Within Functional Limits for tasks assessed                                          General Comments General comments (skin integrity, edema, etc.): Pt nervous about getting up, but overall did well with encouragement    Exercises     Assessment/Plan    PT Assessment Patient needs continued PT services  PT Problem List Decreased  strength;Decreased range of motion;Decreased activity tolerance;Decreased balance;Decreased mobility;Decreased coordination;Decreased knowledge of use of DME;Decreased knowledge of precautions;Pain       PT Treatment Interventions DME instruction;Gait training;Functional mobility training;Therapeutic activities;Therapeutic exercise;Balance training;Neuromuscular re-education;Patient/family education    PT Goals (Current goals can be found in the Care Plan section)  Acute Rehab PT Goals Patient Stated Goal: Get back to normal PT Goal Formulation: With patient Time For Goal Achievement: 12/31/22 Potential to Achieve Goals: Good    Frequency Min 1X/week     Co-evaluation               AM-PAC PT "6 Clicks" Mobility  Outcome Measure Help needed turning from your back to your side while in a flat bed without using bedrails?: A Lot Help needed moving from lying on your back to sitting on the side of a flat bed without using bedrails?: A Lot Help needed moving to and from a bed to a chair (including a wheelchair)?: A Lot Help needed standing up from a chair using your arms (e.g., wheelchair or bedside chair)?: Total Help needed to walk in hospital room?: Total Help needed climbing 3-5 steps with a railing? : Total 6 Click Score: 9    End of Session Equipment Utilized During Treatment: Gait belt (at axillae) Activity Tolerance: Patient  tolerated treatment well Patient left: in chair;with call bell/phone within reach;with chair alarm set;with family/visitor present Nurse Communication: Mobility status PT Visit Diagnosis: Muscle weakness (generalized) (M62.81);History of falling (Z91.81)    Time: 0910-1000 PT Time Calculation (min) (ACUTE ONLY): 50 min   Charges:   PT Evaluation $PT Eval Moderate Complexity: 1 Mod PT Treatments $Gait Training: 8-22 mins $Therapeutic Activity: 8-22 mins PT General Charges $$ ACUTE PT VISIT: 1 Visit         Van Clines, PT  Acute  Rehabilitation Services Office [phone] Secure Chat welcomed   Levi Aland 12/17/2022, 2:20 PM

## 2022-12-17 NOTE — Progress Notes (Signed)
    Patient doing well  Has not been up with PT yet Patient reports improved strength in his b/l LEs   Physical Exam: Vitals:   12/17/22 0005 12/17/22 0416  BP: 135/71 132/69  Pulse: 77 73  Resp: 16 16  Temp: 97.8 F (36.6 C)   SpO2: 95% 96%    Dressing in place Increased strength to knee extension, DF and EHL b/l + sensation at b/l feet  Drain output: 90cc/12 hours  POD #1 s/p thoracic decompression for myelopathy with improved strength  - up with PT/OT, encourage ambulation - Percocet for pain, Robaxin for muscle spasms - likely will require a transfer to rehab due to myelopathy and weakness - mobility is very important at this point - PT BID and OOB to chair TID

## 2022-12-17 NOTE — Evaluation (Signed)
Occupational Therapy Evaluation Patient Details Name: Mario [name] MRN: [id number] DOB: 10-16-1980 Today's Date: 12/17/2022   History of Present Illness Mario [name] is a 42 y.o. 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   Clinical Impression   Patient reports living at home with wife in a multi level home with 1 STE and is normally Independent with ADLs and wife completing IADLs.  Patient has required assist for LB ADLs since June per patient report and has recently been using RW for functional mobility.  Patient currently requires mod a x2 for sit to stand from bedside chair and mod A for stand pivot transfer to [facility] using RW.  Total A for clothing mgmt.  Sit to stand from [facility] completed with mod A and transfer back to bedside chair completed with RW mod Ax1 and verbal cues for overall sequencing and overall safety.  Patient would benefit from additional OT intervention to address functional deficits in order for patient to return to PLOF      If plan is discharge home, recommend the following: A little help with walking and/or transfers;Assistance with cooking/housework;Direct supervision/assist for medications management;Direct supervision/assist for financial management;Assist for transportation;Help with stairs or ramp for entrance;A lot of help with bathing/dressing/bathroom    Functional Status Assessment  Patient has had a recent decline in their functional status and demonstrates the ability to make significant improvements in function in a reasonable and predictable amount of time.  Equipment Recommendations  BSC/3in1;Tub/shower bench    Recommendations for Other Services       Precautions / Restrictions Precautions Precautions: Back Precaution Comments: Educated pt in back  prec Restrictions Weight Bearing Restrictions: No      Mobility Bed Mobility Overal bed mobility:  (patient sitting in bedside chair)                  Transfers Overall transfer level: Needs assistance Equipment used: Standard walker Transfers: Sit to/from Stand, Bed to chair/wheelchair/BSC Sit to Stand: Mod assist, +2 physical assistance, +2 safety/equipment                  Balance Overall balance assessment: Needs assistance Sitting-balance support: Feet supported Sitting balance-Leahy Scale: Good     Standing balance support: Bilateral upper extremity supported, Reliant on assistive device for balance                               ADL either performed or assessed with clinical judgement   ADL Overall ADL's : Needs assistance/impaired Eating/Feeding: Set up;Sitting   Grooming: Wash/dry hands;Wash/dry face;Oral care;Set up;Sitting   Upper Body Bathing: Minimal assistance;Sitting   Lower Body Bathing: Maximal assistance   Upper Body Dressing : Moderate assistance;Sitting   Lower Body Dressing: Total assistance;Sitting/lateral leans   Toilet Transfer: Moderate assistance;+2 for physical assistance;+2 for safety/equipment;Stand-pivot;BSC/3in1;Rolling walker (2 wheels)   Toileting- Clothing Manipulation and Hygiene: Total assistance       Functional mobility during ADLs: Moderate assistance;Rolling walker (2 wheels);Cueing for safety;Cueing for sequencing       Vision Baseline Vision/History: 0 No visual deficits Patient Visual Report: No change from baseline       Perception         Praxis         Pertinent Vitals/Pain Pain Assessment Pain Assessment: 0-10 Pain Score: 5  Pain Location: back Pain Descriptors / Indicators: Aching Pain Intervention(s): Monitored during session     Extremity/Trunk Assessment Upper Extremity Assessment Upper Extremity Assessment: Generalized weakness       Cervical / Trunk  Assessment Cervical / Trunk Assessment: Normal   Communication Communication Communication: No apparent difficulties   Cognition Arousal: Alert Behavior During Therapy: WFL for tasks assessed/performed Overall Cognitive Status: Within Functional Limits for tasks assessed                                       General Comments       Exercises     Shoulder Instructions      Home Living Family/patient expects to be discharged to:: Private residence Living Arrangements: Spouse/significant other Available Help at Discharge: Family;Available 24 hours/day (at or near 24 hours) Type of Home: Apartment Home Access: Stairs to enter (need clarifiacation, may be a level entry) Entrance Stairs-Number of Steps: 1 Entrance Stairs-Rails: None Home Layout: Two level;1/2 bath on main level;Bed/bath upstairs Alternate Level Stairs-Number of Steps: Flight                        Prior Functioning/Environment Prior Level of Function : Independent/Modified Independent               ADLs Comments:  (patient reporting since June, his wife has been having to help him with LB ADLs. Prior to this was Independent in ADLs and driving.  Wife took care of IADLs)        OT Problem List: Decreased strength;Decreased activity tolerance;Decreased safety awareness;Decreased knowledge of use of DME or AE      OT Treatment/Interventions: Self-care/ADL training;Therapeutic exercise;Neuromuscular education;Therapeutic activities;Patient/family education    OT Goals(Current goals can be found in the care plan section) Acute Rehab OT Goals OT Goal Formulation: With patient Time For Goal Achievement: 12/31/22 Potential to Achieve Goals: Good  OT Frequency: Min 1X/week    Co-evaluation              AM-PAC OT "6 Clicks" Daily Activity     Outcome Measure Help from another person eating meals?: None Help from another person taking care of personal grooming?: A Little Help  from another person toileting, which includes using toliet, bedpan, or urinal?: A Lot Help from another person bathing (including washing, rinsing, drying)?: A Lot Help from another person to put on and taking off regular upper body clothing?: A Little Help from another person to put on and taking off regular lower body clothing?: Total 6 Click Score: 15   End of Session Equipment Utilized During Treatment: Rolling walker (2 wheels) Nurse Communication: Mobility status  Activity Tolerance: Patient tolerated treatment well Patient left: in chair;with call bell/phone within reach;with family/visitor present  OT Visit Diagnosis: Muscle weakness (generalized) (M62.81);Repeated falls (R29.6);Unsteadiness on feet (R26.81)                Time: 6213-0865 OT Time Calculation (min): 35 min Charges:  OT General Charges $OT Visit: 1 Visit OT Evaluation $OT Eval Moderate Complexity: 1 Mod OT Treatments $Self Care/Home Management : 8-22 mins  Governor Specking OT/L  Denice Paradise 12/17/2022, 1:19 PM

## 2022-12-18 DIAGNOSIS — G959 Disease of spinal cord, unspecified: Secondary | ICD-10-CM | POA: Diagnosis not present

## 2022-12-18 NOTE — Plan of Care (Signed)
  Problem: Education: Goal: Knowledge of General Education information will improve Description: Including pain rating scale, medication(s)/side effects and non-pharmacologic comfort measures Outcome: Progressing   Problem: Clinical Measurements: Goal: Will remain free from infection Outcome: Progressing   Problem: Activity: Goal: Risk for activity intolerance will decrease Outcome: Progressing   Problem: Pain Managment: Goal: General experience of comfort will improve Outcome: Not Progressing

## 2022-12-18 NOTE — Plan of Care (Signed)

## 2022-12-18 NOTE — Progress Notes (Signed)
    Patient doing well  Making gains with PT  Despite my order at 9:24pm last night to remove blake drain (over 15 hours ago), the patient's drain is still in place.    Physical Exam: Vitals:   12/17/22 2027 12/18/22 0517  BP: 132/74 117/66  Pulse: 72 67  Resp: 18 18  Temp: 98 F (36.7 C) 97.9 F (36.6 C)  SpO2: 100% 97%    Dressing in place Drain is still in place  Pt s/p thoracic decompression, doing well. Pending transfer to rehab.   - up with PT/OT, encourage ambulation - Percocet for pain, Robaxin for muscle spasms  - drain removed with nurse at patient's bedside

## 2022-12-18 NOTE — PMR Pre-admission (Signed)
PMR Admission Coordinator Pre-Admission Assessment  Patient: Mario [name] is an 42 y.o., male MRN: [id number] DOB: [birth year] Height: 6\' 1"  (185.4 cm) Weight: 129.3 kg             Insurance Information HMO:     PPO: yes     PCP:      IPA:      80/20:      OTHER:  PRIMARY: [employer] [place]      Policy#: [id number]      Subscriber: pt CM Name: Marye Round      Phone#: [phone]     Fax#: [phone] Pre-Cert#: [id number]      approved 12/22/22 until 12/29/22 . I have requested Auth days to be adjusted to 9/4 until 9/11 to reflect day admitted Employer:  Benefits:  Phone #: [phone]     Name: 8/29 Eff. Date: 06/19/20     Deduct: $600      Out of Pocket Max: $1800      Life Max: none  CIR: 80%      SNF: 80% 60 days Outpatient: $30 per visits     Co-Pay: 60 visits combined Home Health: 80%      Co-Pay: 20% DME: 80%     Co-Pay: 20% Providers: in network  SECONDARY: none      Policy#:       Phone#:   Artist:       Phone#:   The Data processing manager" for patients in Inpatient Rehabilitation Facilities with attached "Privacy Act Statement-Health Care Records" was provided and verbally reviewed with: N/A  Emergency Contact Information Contact Information     Name Relation Home Work Mobile   [place] Spouse [phone]  [phone]      Other Contacts   None on File    Current Medical History  Patient Admitting Diagnosis: Myelopathy  History of Present Illness: Mario [name] is a 42 y.o. R handed male  with hx of HTN, diverticulosis who had worsening/progressive LE weakness and numbness who saw Ortho Spine- however before surgery was able to be done, he was admitted through ED 12/14/22 for progressive LE weakness to the point he could not walk anymore.    He underwent T7-T10 decompression and lami by Dr Yevette Edwards  and patient reports spasms and tightness as well as strength are all somewhat better since surgery, however pain is  significant and cannot sleep or get comfortable due to pain- he describes as aching and throbbing.   Patient[facility] from [facility] has been reviewed by the rehabilitation admission coordinator and physician.  Past Medical History  Past Medical History:  Diagnosis Date   Bronchitis    Diverticulitis 10/2015   Hypertension    Has the patient had major surgery during 100 days prior to admission? Yes  Family History  family history is not on file.  Current Medications   Current Facility-Administered Medications:    0.9 %  sodium chloride infusion, 250 mL, Intravenous, Continuous, McKenzie, Kayla J, PA-C, Stopped at 12/16/22 2026   0.9 % NaCl with KCl 20 mEq/ L  infusion, , Intravenous, Continuous, McKenzie, Kayla J, PA-C   acetaminophen (TYLENOL) tablet 650 mg, 650 mg, Oral, Q4H PRN, 650 mg at 12/21/22 2049 **OR** acetaminophen (TYLENOL) suppository 650 mg, 650 mg, Rectal, Q4H PRN, McKenzie, Kayla J, PA-C   alum & mag hydroxide-simeth (MAALOX/MYLANTA) 200-200-20 MG/5ML suspension 30 mL, 30 mL, Oral, Q6H PRN, McKenzie, Kayla J, PA-C   baclofen (LIORESAL) tablet 10 mg, 10  mg, Oral, QID PRN, McKenzie, Kayla J, PA-C, 10 mg at 12/24/22 0140   bisacodyl (DULCOLAX) EC tablet 5 mg, 5 mg, Oral, Daily PRN, McKenzie, Kayla J, PA-C, 5 mg at 12/23/22 1456   Chlorhexidine Gluconate Cloth 2 % PADS 6 each, 6 each, Topical, Q0600, Estill Bamberg, MD, 6 each at 12/24/22 0753   docusate sodium (COLACE) capsule 100 mg, 100 mg, Oral, BID, McKenzie, Kayla J, PA-C, 100 mg at 12/24/22 0755   fenofibrate tablet 160 mg, 160 mg, Oral, Daily, McKenzie, Kayla J, PA-C, 160 mg at 12/24/22 0756   HYDROcodone-acetaminophen (NORCO/VICODIN) 5-325 MG per tablet 1-2 tablet, 1-2 tablet, Oral, Q4H PRN, McKenzie, Kayla J, PA-C, 2 tablet at 12/23/22 0035   irbesartan (AVAPRO) tablet 75 mg, 75 mg, Oral, Daily, McKenzie, Kayla J, PA-C, 75 mg at 12/24/22 0755   menthol-cetylpyridinium (CEPACOL) lozenge 3 mg, 1  lozenge, Oral, PRN **OR** phenol (CHLORASEPTIC) mouth spray 1 spray, 1 spray, Mouth/Throat, PRN, McKenzie, Kayla J, PA-C   metFORMIN (GLUCOPHAGE-XR) 24 hr tablet 500 mg, 500 mg, Oral, Q breakfast, McKenzie, Kayla J, PA-C, 500 mg at 12/24/22 0754   morphine (PF) 2 MG/ML injection 1-2 mg, 1-2 mg, Intravenous, Q2H PRN, McKenzie, Kayla J, PA-C, 2 mg at 12/20/22 1108   mupirocin ointment (BACTROBAN) 2 %, , Nasal, BID, Estill Bamberg, MD, Given at 12/23/22 2100   ondansetron (ZOFRAN) tablet 4 mg, 4 mg, Oral, Q6H PRN **OR** ondansetron (ZOFRAN) injection 4 mg, 4 mg, Intravenous, Q6H PRN, McKenzie, Kayla J, PA-C   Oral care mouth rinse, 15 mL, Mouth Rinse, PRN, McKenzie, Kayla J, PA-C   oxyCODONE-acetaminophen (PERCOCET/ROXICET) 5-325 MG per tablet 1-2 tablet, 1-2 tablet, Oral, Q4H PRN, McKenzie, Kayla J, PA-C, 2 tablet at 12/24/22 0754   senna-docusate (Senokot-S) tablet 1 tablet, 1 tablet, Oral, QHS PRN, McKenzie, Kayla J, PA-C, 1 tablet at 12/24/22 0755   sodium chloride flush (NS) 0.9 % injection 3 mL, 3 mL, Intravenous, Q12H, McKenzie, Kayla J, PA-C, 3 mL at 12/23/22 0926   sodium chloride flush (NS) 0.9 % injection 3 mL, 3 mL, Intravenous, PRN, McKenzie, Kayla J, PA-C, 3 mL at 12/20/22 2044   sodium phosphate (FLEET) enema 1 enema, 1 enema, Rectal, Once PRN, McKenzie, Kayla J, PA-C   zolpidem (AMBIEN) tablet 5 mg, 5 mg, Oral, QHS PRN,MR X 1, McKenzie, Kayla J, PA-C, 5 mg at 12/23/22 2100  Patients Current Diet:  Diet Order             Diet regular Room service appropriate? Yes; Fluid consistency: Thin  Diet effective now                  Precautions / Restrictions Precautions Precautions: Back, Fall, Other (comment) Precaution Comments: reviewed back precautions Spinal Brace: Thoracolumbosacral orthotic, Applied in sitting position ("no brace needed" in order set, but pt prefers to wear brace) Restrictions Weight Bearing Restrictions: No   Has the patient had 2 or more falls or a fall  with injury in the past year?Yes Falls daily since May and now falling out of bed at home  Prior Activity Level Limited Community (1-2x/wk): using walker since May; daily falls and recent falls from bed  Prior Functional Level Prior Function Prior Level of Function :  (Mod I with RW since May; worked and drove prior to that) ADLs Comments:  (patient reporting since June, his wife has been having to help him with LB ADLs. Prior to this was Independent in ADLs and driving.  Wife took care of  IADLs)  Self Care: Did the patient need help bathing, dressing, using the toilet or eating?  Needed some help  Indoor Mobility: Did the patient need assistance with walking from room to room (with or without device)? Needed some help  Stairs: Did the patient need assistance with internal or external stairs (with or without device)? Needed some help  Functional Cognition: Did the patient need help planning regular tasks such as shopping or remembering to take medications? Independent  Patient Information Are you of Hispanic, Latino/a,or Spanish origin?: A. No, not of Hispanic, Latino/a, or Spanish origin What is your race?: B. Black or African American Do you need or want an interpreter to communicate with a doctor or health care staff?: 0. No  Patient's Response To:  Health Literacy and Transportation Is the patient able to respond to health literacy and transportation needs?: Yes Health Literacy - How often do you need to have someone help you when you read instructions, pamphlets, or other written material from your doctor or pharmacy?: Never In the past 12 months, has lack of transportation kept you from medical appointments or from getting medications?: No In the past 12 months, has lack of transportation kept you from meetings, work, or from getting things needed for daily living?: No  Journalist, newspaper / Equipment Home Assistive Devices/Equipment: None  Prior Device Use: Indicate  devices/aids used by the patient prior to current illness, exacerbation or injury? Walker  Current Functional Level Cognition  Overall Cognitive Status: Within Functional Limits for tasks assessed Orientation Level: Oriented X4 General Comments: eager to work, able to recall back precautions and brace purpose    Extremity Assessment (includes Sensation/Coordination)  Upper Extremity Assessment: Overall WFL for tasks assessed  Lower Extremity Assessment: Defer to PT evaluation RLE Deficits / Details: noted to have no dorsiflexion with testing. Pt with transfer keeping the R LE off the ground and reports "its spasming" pt with decreased coordination of the R LE LLE Deficits / Details: Grossly 3/5 throughout; noteworthy for quad weakness evidenced by tendency for genu recurvatum in stance, and depending on ligaments/posterior elements of knees for stance stability; difficulty with mini-squats with UE support on RW    ADLs  Overall ADL's : Needs assistance/impaired Eating/Feeding: Set up, Sitting Grooming: Wash/dry face, Sitting, Set up Upper Body Bathing: Minimal assistance, Sitting Lower Body Bathing: Maximal assistance Upper Body Dressing : Moderate assistance, Sitting Lower Body Dressing: Total assistance, Sitting/lateral leans Toilet Transfer: Moderate assistance, +2 for physical assistance, +2 for safety/equipment, Rolling walker (2 wheels) Toilet Transfer Details (indicate cue type and reason): lateral scoot transfer, max A +2 for standing attempted Toileting- Clothing Manipulation and Hygiene: Maximal assistance, +2 for physical assistance, +2 for safety/equipment, Sit to/from stand Functional mobility during ADLs: Moderate assistance, Maximal assistance, +2 for physical assistance, +2 for safety/equipment General ADL Comments: pain limited, able to stand this date but unable to progress to stepping. mod A +2 for lateral scoot from bed>chair    Mobility  Overal bed mobility: Needs  Assistance Bed Mobility: Rolling, Sidelying to Sit Rolling: Min assist Sidelying to sit: Min assist, +2 for safety/equipment Sit to supine: Max assist, Used rails General bed mobility comments: Much improved compared to last session    Transfers  Overall transfer level: Needs assistance Equipment used: Ambulation equipment used Transfers: Sit to/from Stand, Bed to chair/wheelchair/BSC Sit to Stand: Mod assist (and min assist from high stedy seat flaps) Bed to/from chair/wheelchair/BSC transfer type:: Lateral/scoot transfer  Lateral/Scoot Transfers: Min assist, +2 safety/equipment  General transfer comment: performed lateral scoot bed to recliner with min assist (second person steadying recliner chair; stood form recliner to stedy with light mod assist, then performed serial sit<>stands from high stedy seat    Ambulation / Gait / Stairs / Wheelchair Mobility  Ambulation/Gait Ambulation/Gait assistance: Mod assist, Min assist, +2 safety/equipment Gait Distance (Feet): 30 Feet (x2 with seated break between, then 67ft) Assistive device: Rolling walker (2 wheels) (bari width) Gait Pattern/deviations: Decreased step length - right, Decreased step length - left, Step-to pattern, Step-through pattern, Decreased stance time - left, Decreased dorsiflexion - right, Decreased dorsiflexion - left General Gait Details: partial step through to step to gait pattern with decreased stance time on the L compared to the R with heavy UE dependence on RW. Pt appearing to have difficulty with bil dorsiflexion and inconsistent foot placement, pt often with narrow BOS then when cued to widen stance, brings feet wider than shoulder width. Diaphoretic once he reaches ~20-68ft and cold washcloth to forehead. SpO2 98% on RA and HR ~100 bpm with exertion. Did not check standing BP as pt not c/o dizziness. Pt had difficulty managing RW placement needed assist to advance and turn AD during session. Close chair follow for  safety. Gait velocity: Significant decrease in cadence. Gait velocity interpretation: <1.31 ft/sec, indicative of household ambulator Pre-gait activities: March in place within [employer] / Balance Dynamic Sitting Balance Sitting balance - Comments: No overt LOB. Pt reports back pain is better today Balance Overall balance assessment: Needs assistance Sitting-balance support: Feet supported Sitting balance-Leahy Scale: Fair Sitting balance - Comments: No overt LOB. Pt reports back pain is better today Standing balance support: Bilateral upper extremity supported, During functional activity, Reliant on assistive device for balance Standing balance-Leahy Scale: Poor Standing balance comment: heavy dependence on UEs    Special needs/care consideration Fall precautions   Previous Home Environment  Living Arrangements: Spouse/significant other (and teenage children)  Lives With: Spouse (teenage children) Available Help at Discharge: Family, Available 24 hours/day (at or near 24 hours) Type of Home: Apartment Home Layout: 1/2 bath on main level, Bed/bath upstairs Alternate Level Stairs-Number of Steps: Flight Home Access: Stairs to enter Entrance Stairs-Rails: None Secretary/administrator of Steps: 1 Bathroom Shower/Tub: Associate Professor: Yes How Accessible: Accessible via walker Home Care Services: No  Discharge Living Setting Plans for Discharge Living Setting: Patient's home, Apartment, Lives with (comment) (spouse and teenage children) Type of Home at Discharge: Apartment Discharge Home Layout: Two level, 1/2 bath on main level, Bed/bath upstairs Alternate Level Stairs-Number of Steps: flight Discharge Home Access: Stairs to enter Entrance Stairs-Rails: None Entrance Stairs-Number of Steps: 1 Discharge Bathroom Shower/Tub: Tub/shower unit Discharge Bathroom Toilet: Standard Discharge Bathroom Accessibility: Yes How  Accessible: Accessible via walker Does the patient have any problems obtaining your medications?: No  Social/Family/Support Systems Patient Roles: Spouse, Parent Contact Information: wife Anticipated Caregiver: wife Anticipated Caregiver's Contact Information: see contacts Ability/Limitations of Caregiver: none Caregiver Availability: 24/7 Discharge Plan Discussed with Primary Caregiver: Yes Is Caregiver In Agreement with Plan?: Yes Does Caregiver/Family have Issues with Lodging/Transportation while Pt is in Rehab?: No  Goals Patient/Family Goal for Rehab: Mod I to supervision with PT and OT Expected length of stay: ELOS 10 to 14 days Pt/Family Agrees to Admission and willing to participate: Yes Program Orientation Provided & Reviewed with Pt/Caregiver Including Roles  & Responsibilities: Yes  Decrease burden of Care through IP rehab admission: n/a  Possible need for  SNF placement upon discharge:not anticipated  Patient Condition: This patient's medical and functional status has changed since the consult dated: 12/18/22 in which the Rehabilitation Physician determined and documented that the patient's condition is appropriate for intensive rehabilitative care in an inpatient rehabilitation facility. See "History of Present Illness" (above) for medical update. Functional changes are: mod assist overall. Patient's medical and functional status update has been discussed with the Rehabilitation physician and patient remains appropriate for inpatient rehabilitation. Will admit to inpatient rehab today.  Preadmission Screen Completed By:  Clois Dupes, RN MSN 12/24/2022 9:56 AM ______________________________________________________________________   Discussed status with Dr. Carlis Abbott on 12/24/22 at 0957 and received approval for admission today.  Admission Coordinator:  Clois Dupes RN MSN, time [id number] Date 12/24/22

## 2022-12-18 NOTE — Progress Notes (Signed)
Physical Therapy Treatment Patient Details Name: Mario [name] MRN: [id number] DOB: 22-Sep-1980 Today's Date: 12/18/2022   History of Present Illness Mario [name] is a 42 y.o. 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    PT Comments  Pt is progressing towards goals. Currently pt is Mod A for bed mobility and sit to stand with 1 person assist and verbal cues for sequencing intermittently as well as for precautions. Pt was able to increase gait distance this session but continues with heavy UE reliance on AD with gait. Pt gait improved with verbal cues for technique with pt initially demonstrating mild L toe drag. Due to pt current functional status, home set up and available assistance at home recommending skilled physical therapy services 5-6 days/week > 3 hours/day at a higher level of care and frequency in order to address strength, balance, pain and functional mobility to return to age related activities and decreased risk for falls, injury and re-hospitalization. Pt pain his some what limiting pt progress.      If plan is discharge home, recommend the following: A lot of help with walking and/or transfers;A lot of help with bathing/dressing/bathroom     Equipment Recommendations  Rolling walker (2 wheels);BSC/3in1       Precautions / Restrictions Precautions Precautions: Back Precaution Comments: Educated pt in back prec Restrictions Weight Bearing Restrictions: No     Mobility  Bed Mobility Overal bed mobility: Needs Assistance Bed Mobility: Rolling, Sidelying to Sit Rolling: Mod assist Sidelying to sit: Mod assist       General bed mobility comments: Mod assist to fully roll into sidelying; very heavy mod assist to elevate trunk to sitting with extra time and performed in increments. Pt left  sitting in recliner on request    Transfers Overall transfer level: Needs assistance Equipment used: Rolling walker (2 wheels) Transfers: Sit to/from Stand, Bed to chair/wheelchair/BSC Sit to Stand: Mod assist           General transfer comment: Stood from EOB slightly elevated  Mod assist for initial momentum to get to standing.    Ambulation/Gait Ambulation/Gait assistance: Min assist, +2 physical assistance, +2 safety/equipment Gait Distance (Feet): 75 Feet Assistive device: Rolling walker (2 wheels) Gait Pattern/deviations: Decreased step length - right, Decreased step length - left, Step-to pattern, Step-through pattern, Decreased stance time - left Gait velocity: Significant decrease in cadence. Gait velocity interpretation: <1.31 ft/sec, indicative of household ambulator   General Gait Details: partial step through to step to gait pattern with decreased stance time on the L compared to the R with heavy UE dependence on RW. L toe drag improve with verbal cues for DF and heel strike with gait.        Balance Overall balance assessment: Needs assistance Sitting-balance support: Feet supported Sitting balance-Leahy Scale: Fair Sitting balance - Comments: pt has alot of pain in sitting. Flexed back requires verbal cues to prevent flexing at the thoracic spine.   Standing balance support: Bilateral upper extremity supported, Reliant on assistive device for balance Standing balance-Leahy Scale: Poor Standing balance comment: Min A from physical therapist for balance in standing with bil UE support on AD.          Cognition Arousal: Alert Behavior During Therapy: WFL for tasks assessed/performed Overall Cognitive Status: Within Functional Limits for tasks assessed  Pertinent Vitals/Pain Pain Assessment Pain Score: 8  Pain Location: back Pain Descriptors / Indicators: Aching Pain Intervention(s): Monitored during session, Repositioned, Other  (comment) (Pt states he already received pain medication but it hasn't been working.)     PT Goals (current goals can now be found in the care plan section) Acute Rehab PT Goals Patient Stated Goal: Get back to normal PT Goal Formulation: With patient Time For Goal Achievement: 12/31/22 Potential to Achieve Goals: Good Progress towards PT goals: Progressing toward goals    Frequency    Min 1X/week      PT Plan  Continue with current POC       AM-PAC PT "6 Clicks" Mobility   Outcome Measure  Help needed turning from your back to your side while in a flat bed without using bedrails?: A Lot Help needed moving from lying on your back to sitting on the side of a flat bed without using bedrails?: A Lot Help needed moving to and from a bed to a chair (including a wheelchair)?: A Lot Help needed standing up from a chair using your arms (e.g., wheelchair or bedside chair)?: A Lot Help needed to walk in hospital room?: A Little Help needed climbing 3-5 steps with a railing? : Total 6 Click Score: 12    End of Session Equipment Utilized During Treatment: Gait belt (at axilla) Activity Tolerance: Patient tolerated treatment well;Patient limited by pain Patient left: in chair;with call bell/phone within reach;with family/visitor present Nurse Communication: Mobility status PT Visit Diagnosis: Muscle weakness (generalized) (M62.81);History of falling (Z91.81)     Time: 9604-5409 PT Time Calculation (min) (ACUTE ONLY): 24 min  Charges:    $Gait Training: 8-22 mins $Therapeutic Activity: 8-22 mins PT General Charges $$ ACUTE PT VISIT: 1 Visit                    Harrel Carina, DPT, CLT  Acute Rehabilitation Services Office: [phone] (Secure chat preferred)    Mario [name] 12/18/2022, 2:37 PM

## 2022-12-18 NOTE — Consult Note (Signed)
Physical Medicine and Rehabilitation Consult Reason for Consult:CIR/acute rehab Referring Physician: Dr Estill Bamberg   HPI: Mario [name] is a 42 y.o. R handed male  with hx of HTN, diverticulosis who had worsening/progressive LE weakness and numbness who saw Ortho Spine- however before surgery was able to be done, he was admitted through ED 12/14/22 for progressive LE weakness to the point he could not walk anymore.   He underwent T7-T10 decompression and lami by Dr Yevette Edwards  and patient reports spasms and tightness as well as strength are all somewhat better since surgery, however pain is significant and cannot sleep or get comfortable due to pain- he describes as aching and throbbing.   Notes he got Percocet in ED, so thinks that's why (+) for opiates in ED- but didn't give a reason for (+) cocaine on UDS.   Also notes, emptying when he pees- and doesn't feel like needs to go 30-60 minutes later.  No BM since Saturday or Sunday- since before admission. However has attempted to go 3 different times but always passed gas. Doesn't feel constipated.   Also c/o numbness from waist down- that hasn't gotten much better since surgery.  Also note tongue going numb intermittently.  Doesn't know why.  They pulled drain 15 minutes ago- painful.  Not sleeping at night due to pain.    Of note, saw from chart was written that pt was on percocet and Robaxin, however is on Flexeril and Norco, NOT the Percocet/Robaxin.   Pt notes had hiccups until given meds yesterday- made them go away.   Review of Systems  Constitutional:  Positive for malaise/fatigue. Negative for chills and weight loss.  HENT: Negative.    Eyes: Negative.   Respiratory:  Negative for cough, sputum production and wheezing.   Cardiovascular:  Negative for chest pain, orthopnea and leg swelling.  Gastrointestinal:  Positive for constipation. Negative for diarrhea, nausea and vomiting.  Genitourinary: Negative.    Musculoskeletal:  Positive for back pain, falls and myalgias.  Skin: Negative.   Neurological:  Positive for sensory change, focal weakness (Lastly, notes, that R leg was stronger before surgery, but is now weaker than L) and weakness.  Endo/Heme/Allergies: Negative.   Psychiatric/Behavioral:  Positive for depression. Negative for hallucinations and suicidal ideas. The patient has insomnia.   All other systems reviewed and are negative.  Past Medical History:  Diagnosis Date   Bronchitis    Diverticulitis 10/2015   Hypertension    Past Surgical History:  Procedure Laterality Date   LUMBAR LAMINECTOMY/DECOMPRESSION MICRODISCECTOMY N/A 12/16/2022   Procedure: THORACIC DECOMPRESSION;  Surgeon: Estill Bamberg, MD;  Location: [facility] OR;  Service: Orthopedics;  Laterality: N/A;   History reviewed. No pertinent family history. Social History:  reports that he has been smoking cigarettes. He has never used smokeless tobacco. He reports current alcohol use. He reports that he does not currently use drugs after having used the following drugs: Cocaine. Allergies: No Known Allergies Medications Prior to Admission  Medication Sig Dispense Refill   acetaminophen (TYLENOL) 650 MG CR tablet Take 1,300 mg by mouth 2 (two) times daily as needed for pain.     cyclobenzaprine (FLEXERIL) 10 MG tablet Take 1 tablet (10 mg total) by mouth 3 (three) times daily as needed for muscle spasms. (Patient taking differently: Take 10 mg by mouth 2 (two) times daily as needed for muscle spasms.) 10 tablet 0   metFORMIN (GLUCOPHAGE-XR) 500 MG 24 hr tablet Take 1 tablet by mouth  daily with breakfast.     valsartan (DIOVAN) 80 MG tablet Take 1 tablet by mouth daily.     amLODipine-benazepril (LOTREL) 5-20 MG capsule Take 1 capsule by mouth daily. (Patient not taking: Reported on 12/15/2022) 30 capsule 2   amoxicillin-clavulanate (AUGMENTIN) 875-125 MG tablet Take 1 tablet by mouth every 12 (twelve) hours. (Patient not taking:  Reported on 12/15/2022) 14 tablet 0   fenofibrate (TRICOR) 145 MG tablet Take by mouth. (Patient not taking: Reported on 12/15/2022)     lidocaine (LIDODERM) 5 % Place 1 patch onto the skin daily. Remove & Discard patch within 12 hours or as directed by MD (Patient not taking: Reported on 12/15/2022) 30 patch 0   methocarbamol (ROBAXIN) 500 MG tablet Take 1 tablet (500 mg total) by mouth 2 (two) times daily. (Patient not taking: Reported on 12/15/2022) 20 tablet 0    Home: Home Living Family/patient expects to be discharged to:: Private residence Living Arrangements: Spouse/significant other Available Help at Discharge: Family, Available 24 hours/day (at or near 24 hours) Type of Home: Apartment Home Access: Stairs to enter (need clarifiacation, may be a level entry) Entrance Stairs-Number of Steps: 1 Entrance Stairs-Rails: None Home Layout: Two level, 1/2 bath on main level, Bed/bath upstairs Alternate Level Stairs-Number of Steps: Flight  Functional History: Prior Function Prior Level of Function : Independent/Modified Independent ADLs Comments:  (patient reporting since June, his wife has been having to help him with LB ADLs. Prior to this was Independent in ADLs and driving.  Wife took care of IADLs) Functional Status:  Mobility: Bed Mobility Overal bed mobility: Needs Assistance Bed Mobility: Rolling, Sidelying to Sit Rolling: Mod assist Sidelying to sit: Mod assist (near Max) General bed mobility comments: Mod assist to fully roll into sidelying; very heavy mod assist to elevate trunk to sitting Transfers Overall transfer level: Needs assistance Equipment used: Standard walker Transfers: Sit to/from Stand, Bed to chair/wheelchair/BSC Sit to Stand: Mod assist, +2 physical assistance, +2 safety/equipment General transfer comment: Stood from EOB slightly elevated 2 person Mod assist to power up Ambulation/Gait Ambulation/Gait assistance: Min assist, +2 physical assistance, +2  safety/equipment (chair follow) Gait Distance (Feet): 5 Feet Assistive device: Rolling walker (2 wheels) Gait Pattern/deviations: Decreased step length - right, Decreased step length - left General Gait Details: Short steps with heavy dependence on UE support from RW; Bil LEs showed genu recurvatum in stance, and depending on ligaments/posterior elements of knees for stance stability Gait velocity: slow    ADL: ADL Overall ADL's : Needs assistance/impaired Eating/Feeding: Set up, Sitting Grooming: Wash/dry hands, Wash/dry face, Oral care, Set up, Sitting Upper Body Bathing: Minimal assistance, Sitting Lower Body Bathing: Maximal assistance Upper Body Dressing : Moderate assistance, Sitting Lower Body Dressing: Total assistance, Sitting/lateral leans Toilet Transfer: Moderate assistance, +2 for physical assistance, +2 for safety/equipment, Stand-pivot, BSC/3in1, Rolling walker (2 wheels) Toileting- Clothing Manipulation and Hygiene: Total assistance Functional mobility during ADLs: Moderate assistance, Rolling walker (2 wheels), Cueing for safety, Cueing for sequencing  Cognition: Cognition Overall Cognitive Status: Within Functional Limits for tasks assessed Orientation Level: Oriented X4 Cognition Arousal: Alert Behavior During Therapy: WFL for tasks assessed/performed Overall Cognitive Status: Within Functional Limits for tasks assessed  Blood pressure 136/80, pulse 77, temperature 98.8 F (37.1 C), resp. rate 18, height 6\' 1"  (1.854 m), weight 129.3 kg, SpO2 99%. Physical Exam Constitutional:      Appearance: He is obese.     Comments: Pt supine in bed; initially asleep but woke to verbal stimuli; awake,  eventually; alert, appropriate, NAD  HENT:     Head: Normocephalic and atraumatic.     Right Ear: External ear normal.     Left Ear: External ear normal.     Nose: Nose normal. No congestion.     Mouth/Throat:     Mouth: Mucous membranes are dry.     Pharynx: Oropharynx  is clear. No oropharyngeal exudate.  Eyes:     General:        Right eye: No discharge.        Left eye: No discharge.     Extraocular Movements: Extraocular movements intact.  Cardiovascular:     Rate and Rhythm: Normal rate and regular rhythm.     Heart sounds: Normal heart sounds. No murmur heard.    No gallop.  Pulmonary:     Effort: Pulmonary effort is normal. No respiratory distress.     Breath sounds: Normal breath sounds. No wheezing, rhonchi or rales.  Abdominal:     General: There is distension.     Palpations: Abdomen is soft.     Tenderness: There is no abdominal tenderness.     Comments: Hypoactive BS; protuberant vs distended  Musculoskeletal:     Cervical back: Neck supple. No tenderness.     Comments: Ue's 5/5 B/L RLE- HF 3+/5; KE/KF 4/5; DF 4/5 and PF 4+/5 LLE- HF 4-/5; KE/KF 4+/5 and DF 4+/5 and PF 5-/5   Skin:    General: Skin is warm and dry.     Comments: Drain out Original dressing on thoracic incision  Neurological:     Mental Status: He is alert and oriented to person, place, and time.     Comments: Decreased to light touch and pinprick at T7 B/L- and progressively gets worse down to S2 B/L T6 is intact to pinprick/light touch MAS of 1+ to 2 in LE's No clonus B/L Patella DTRs 2+- not increased nor Achilles either     No results found for this or any previous visit (from the past 24 hour(s)). DG Lumbar Spine 2-3 Views  Result Date: 12/16/2022 CLINICAL DATA:  Elective surgery. EXAM: LUMBAR SPINE - 2-3 VIEW COMPARISON:  Preoperative MRI FINDINGS: Three portable cross-table lateral views of the thoracic and lumbar spine obtained in the operating room. Image 1 demonstrates surgical instruments posteriorly at the T12 and L1 levels. Image 2 demonstrates surgical instruments posteriorly at the at the T9 and T10 level. Image 3 demonstrates surgical instruments posteriorly at the T8 through T10 levels. IMPRESSION: Intraoperative localization spot views during  spinal surgery. Electronically Signed   By: Narda Rutherford M.D.   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 Yes Co-Morbidities requiring supervision/potential complications: Spasticity, constipation- LBM 4-5 days ago- LE weakness and numbness; cocaine (+), HTN Due to bladder management, bowel management, safety, skin/wound care, disease management, medication administration, pain management, and patient education, does the patient require 24 hr/day rehab nursing? Yes Does the patient require coordinated care of a physician, rehab nurse, therapy disciplines of PT and OT to address physical and functional deficits in the context of the above medical diagnosis(es)? Yes Addressing deficits in the following areas: balance, endurance, locomotion, strength, transferring, bowel/bladder control, bathing, dressing, feeding, grooming, and toileting Can the patient actively participate in an intensive therapy program  of at least 3 hrs of therapy per day at least 5 days per week? Yes The potential for patient to make measurable gains while on inpatient rehab is good Anticipated functional outcomes upon discharge from inpatient rehab are supervision and min assist  with PT, supervision with OT, n/a with SLP. Estimated rehab length of stay to reach the above functional goals is: 2.-3  weeks- Anticipated discharge destination: Home Overall Rehab/Functional Prognosis: good  RECOMMENDATIONS: This patient's condition is appropriate for continued rehabilitative care in the following setting: CIR Patient has agreed to participate in recommended program. Yes Note that insurance prior authorization may be required for reimbursement for recommended care.  Comment:  1  Per chart, patient is on Percocet and Robaxin- HE IS NOT!!!! 2. Pt is on Norco and Flexeril- strongly suggest changing Norco to Percocet for now- with goal to wean while in CIR.  3. Pt could strongly benefit from bladder scans to make sure emptying/doesn't have neurogenic bladder, since had thoracic compression.  4. Suggest Sorbitol 30-60 cc to get pt to have BM 5. Pt NEEDS Baclofen 5 mg TID for spasticity seen on exam- Robaxin and Flexeril not effective for spasticity.  6. Will send in for admission coordinator to see/send in pt for admissions to CIR.  7. Pt needs post op labs to show medical justification for coming to rehab 8. Thank you for consult.    I spent a total of 83   minutes on total care today- >50% coordination of care- due to   Review of chart, orders, vitals and labs; interview and exam of patient- even doing ASIA exam for SCI; as well as documentation of consult.     Genice Rouge, MD 12/18/2022

## 2022-12-18 NOTE — Progress Notes (Signed)
   12/18/22 1107  Mobility  Activity Ambulated with assistance in room  Level of Assistance Moderate assist, patient does 50-74% (+2)  Assistive Device Front wheel walker  Distance Ambulated (ft) 20 ft  Activity Response Tolerated fair  Mobility Referral Yes  $Mobility charge 1 Mobility  Mobility Specialist Start Time (ACUTE ONLY) 2250  Mobility Specialist Stop Time (ACUTE ONLY) 2304  Mobility Specialist Time Calculation (min) (ACUTE ONLY) 14 min   Mobility Specialist: Progress Note  Pt agreeable to mobility session - received in chair.  ModA+2 STS, MinA+2 to ambulate. Pt c/o mid back pain - RN aware. Pt returned to chair with all needs met - call bell within reach.   Barnie Mort, BS Mobility Specialist Please contact via SecureChat or Rehab office at [phone].

## 2022-12-18 NOTE — Progress Notes (Signed)
  Inpatient Rehabilitation Admissions Coordinator   Met with patient and wife at bedside for rehab assessment. We discussed goals and expectations of a possible CIR admit. They prefer CIR for rehab. Family can provide expected caregiver support that is recommended of supervision level. They note multiple falls daily since May. Dr [name] to assess today.  I will begin insurance Auth with [employer] [place] for possible CIR admit pending approval. Please call me with any questions.   Ottie Glazier, RN, MSN Rehab Admissions Coordinator [phone]

## 2022-12-19 ENCOUNTER — Inpatient Hospital Stay (HOSPITAL_COMMUNITY): Payer: BC Managed Care – PPO

## 2022-12-19 DIAGNOSIS — M79661 Pain in right lower leg: Secondary | ICD-10-CM | POA: Diagnosis not present

## 2022-12-19 MED ORDER — BACLOFEN 10 MG PO TABS
10.0000 mg | ORAL_TABLET | Freq: Four times a day (QID) | ORAL | Status: DC | PRN
  Administered 2022-12-20 – 2022-12-24 (×9): 10 mg via ORAL
  Filled 2022-12-19 (×9): qty 1

## 2022-12-19 MED ORDER — METHOCARBAMOL 500 MG PO TABS
500.0000 mg | ORAL_TABLET | Freq: Four times a day (QID) | ORAL | Status: DC
  Filled 2022-12-19: qty 2

## 2022-12-19 MED ORDER — MUPIROCIN 2 % EX OINT
TOPICAL_OINTMENT | Freq: Two times a day (BID) | CUTANEOUS | Status: DC
  Filled 2022-12-19 (×3): qty 22

## 2022-12-19 MED ORDER — CHLORHEXIDINE GLUCONATE CLOTH 2 % EX PADS
6.0000 | MEDICATED_PAD | Freq: Every day | CUTANEOUS | Status: DC
  Administered 2022-12-19 – 2022-12-24 (×5): 6 via TOPICAL

## 2022-12-19 NOTE — Progress Notes (Signed)
Lower extremity venous right study completed.   Please see CV Proc for preliminary results.   Rachel Hodge, RDMS, RVT  

## 2022-12-19 NOTE — Progress Notes (Signed)
  Inpatient Rehabilitation Admissions Coordinator   I met at bedside with patient and reviewed estimated cost of care if CIR approved by [facility] [place]. I await their determination and bed availability for possible CIR admit.  Ottie Glazier, RN, MSN Rehab Admissions Coordinator [phone] 12/19/2022 10:46 AM

## 2022-12-19 NOTE — Plan of Care (Signed)

## 2022-12-19 NOTE — Progress Notes (Signed)
Physical Therapy Treatment Patient Details Name: Mario [name] MRN: [id number] DOB: December 01, 1980 Today's Date: 12/19/2022   History of Present Illness 42 y.o. male presents to [facility] hospital on 12/14/2022 with worsening BLE weakness and numbness. 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 8/27 T7-T10 decompression surgery by [facility] PMH HTN diverticulitis bronchitis    PT Comments  Pt was limited this session due to significant 9/10 pain in the R calf with DF of the R ankle. Pt improved mobility to Min A this session for supine<>sitting on EOB. Did not progress out of bed today due to concerns of pain in the R lower leg. Pt nurse was notified. Due to pt current functional status, continue to recommend skilled physical therapy services > 3 hours/day 5-6 days/week in order to work on functional mobility to return to age related activities on discharge from acute care hospital setting.    If plan is discharge home, recommend the following: A lot of help with walking and/or transfers;A lot of help with bathing/dressing/bathroom     Equipment Recommendations  Rolling walker (2 wheels);BSC/3in1       Precautions / Restrictions Precautions Precautions: Back Precaution Comments: reviewed back precautions for basic transfer Required Braces or Orthoses: Spinal Brace Spinal Brace: Thoracolumbosacral orthotic;Applied in sitting position Restrictions Weight Bearing Restrictions: No     Mobility  Bed Mobility Overal bed mobility: Needs Assistance Bed Mobility: Supine to Sit, Sit to Supine, Rolling Rolling: Supervision Sidelying to sit: Min assist   Sit to supine: Min assist   General bed mobility comments: Min A at trunk to get trunk to midline. Min A for bil LE for sitting to supine.    Transfers     General transfer comment:  Deferred standing at this time        Balance Overall balance assessment: Needs assistance Sitting-balance support: Bilateral upper extremity supported, Feet supported, Single extremity supported Sitting balance-Leahy Scale: Fair Sitting balance - Comments: No overt LOB. Pt reports back pain is better today        Cognition Arousal: Alert Behavior During Therapy: WFL for tasks assessed/performed Overall Cognitive Status: Within Functional Limits for tasks assessed           General Comments General comments (skin integrity, edema, etc.): Pt is reporting sharp pain in the back of the R calf with dorsiflexion. Distended veins throughout the lower leg > the L lower leg. Pt reports pain with manual DF and any WB through the R lower extremity that shoots from heel to posterior knee. RN was notified. Did not continue mobilizing at this time due to concerns.      Pertinent Vitals/Pain Pain Assessment Pain Assessment: 0-10 Pain Score: 9  Pain Location: R calf Pain Descriptors / Indicators: Spasm, Aching Pain Intervention(s): Monitored during session, Other (comment) (RN notified)     PT Goals (current goals can now be found in the care plan section) Acute Rehab PT Goals Patient Stated Goal: Get back to normal PT Goal Formulation: With patient Time For Goal Achievement: 12/31/22 Potential to Achieve Goals: Good Progress towards PT goals: Progressing toward goals    Frequency    Min 1X/week      PT Plan  Continue with current POC       AM-PAC PT "6 Clicks" Mobility   Outcome Measure  Help needed turning  from your back to your side while in a flat bed without using bedrails?: A Little Help needed moving from lying on your back to sitting on the side of a flat bed without using bedrails?: A Little Help needed moving to and from a bed to a chair (including a wheelchair)?: A Lot Help needed standing up from a chair using your arms (e.g., wheelchair or bedside chair)?: A  Lot Help needed to walk in hospital room?: A Little Help needed climbing 3-5 steps with a railing? : Total 6 Click Score: 14    End of Session   Activity Tolerance: Treatment limited secondary to medical complications (Comment);Patient limited by pain (pain in R calf) Patient left: in bed;with call bell/phone within reach Nurse Communication: Mobility status PT Visit Diagnosis: Muscle weakness (generalized) (M62.81);History of falling (Z91.81)     Time: 1610-9604 PT Time Calculation (min) (ACUTE ONLY): 9 min  Charges:    $Therapeutic Activity: 8-22 mins PT General Charges $$ ACUTE PT VISIT: 1 Visit                     Harrel Carina, DPT, CLT  Acute Rehabilitation Services Office: [phone] (Secure chat preferred)    Claudia Desanctis 12/19/2022, 3:01 PM

## 2022-12-19 NOTE — Progress Notes (Addendum)
Occupational Therapy Treatment Patient Details Name: Mario [name] MRN: [id number] DOB: Sep 12, 1980 Today's Date: 12/19/2022   History of present illness 42 y.o. male presents to [facility] hospital on 12/14/2022 with worsening BLE weakness and numbness. 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 8/27 T7-T10 decompression surgery by [facility] PMH HTN diverticulitis bronchitis   OT comments  Pt reports increased pain sitting in chair and returned to supine this session with focus on back precautions for basic transfers. Pt states "I feel like I am going backwards because I did so much better yesterday." Patient will benefit from intensive inpatient follow up therapy, >3 hours/day  OT contacted ortho tech regarding brace in the room with order for no back brace. Ortho tech calling [employer] rep regarding the TLSO brace in the room at this time.          * premedicated 10:02 am oxycodone-acetaminophen 2 tablets Pt reports spasms and noted to not have felxeril since 21:36 8/29 no baclofen administered per chart.    If plan is discharge home, recommend the following:  Two people to help with walking and/or transfers;Two people to help with bathing/dressing/bathroom   Equipment Recommendations  BSC/3in1;Tub/shower bench    Recommendations for Other Services Rehab consult    Precautions / Restrictions Precautions Precautions: Back Precaution Comments: reviewed back precautions for basic transfer Required Braces or Orthoses: Spinal Brace Spinal Brace: Thoracolumbosacral orthotic;Applied in sitting position       Mobility Bed Mobility Overal bed mobility: Needs Assistance Bed Mobility: Rolling, Sit to Supine Rolling: Min assist     Sit to supine: Max assist, Used rails   General bed mobility comments: pt instructed on sequence to  maintain back precautions. pt requires max (A) to bring bil LE up onto the bed surface and then min (A) to roll onto the back.    Transfers Overall transfer level: Needs assistance Equipment used: Rolling walker (2 wheels) Transfers: Sit to/from Stand Sit to Stand: Max assist           General transfer comment: Pt sitting on pillow in the chair on arrival to elevate the surface. pt needs mod (A) with pad to bring hips foward to the edge of the chair to prepare for sit<>Stand. Pt with R LE weakness and leaning slighting L with power up . Pt requires (A) to elevated from surface and then (A) to anterior weight shift to achieve hands on RW. pt once standing needs increased time to elongate trunk and reports "i dont go it. this R leg is weak"     Balance Overall balance assessment: Needs assistance Sitting-balance support: Bilateral upper extremity supported, Feet supported Sitting balance-Leahy Scale: Fair     Standing balance support: Bilateral upper extremity supported, During functional activity, Reliant on assistive device for balance Standing balance-Leahy Scale: Poor Standing balance comment: pt requires (A) to remain stable in RW with R LE at times off the floor in response to static standing.                           ADL either performed or assessed with clinical judgement   ADL Overall ADL's : Needs assistance/impaired  Toilet Transfer: Maximal Dentist Details (indicate cue type and reason): elevated surface simulated with the chair to bed transfer. pt needs (A) to power up from chair and weight shift by therapist to get upright posture           General ADL Comments: pt in chair on arrival and reports being in the chair since 8am ish. The room is quiet and dark. pt reports that just how he prefers it and denies sound/ light sensitivity. pt transfered back to bed as back precautions indicate the patient was up  longer than recommended time. Of note a back brace in the room at the end of session. next session to focus on education and proper fit of brace.    Extremity/Trunk Assessment Upper Extremity Assessment Upper Extremity Assessment: Overall WFL for tasks assessed   Lower Extremity Assessment Lower Extremity Assessment: RLE deficits/detail;LLE deficits/detail RLE Deficits / Details: noted to have no dorsiflexion with testing. Pt with transfer keeping the R LE off the ground and reports "its spasming" pt with decreased coordination of the R LE        Vision       Perception     Praxis      Cognition Arousal: Alert Behavior During Therapy: [facility] for tasks assessed/performed Overall Cognitive Status: Within Functional Limits for tasks assessed                                          Exercises Exercises: Other exercises Other Exercises Other Exercises: Pt reports pain in calf and inner thigh with any tactile input to the R LE. attempting AAROM for dorsiflexion and knee flexion.    Shoulder Instructions       General Comments incision site covered with dressing at this time. Gait belt utilized very high in arm pt area across chest to position above incision    Pertinent Vitals/ Pain       Pain Assessment Pain Assessment: 0-10 Pain Score: 9  Pain Location: back Pain Descriptors / Indicators: Spasm Pain Intervention(s): Repositioned, Limited activity within patient's tolerance, Monitored during session  Home Living                                          Prior Functioning/Environment              Frequency  Min 1X/week        Progress Toward Goals  OT Goals(current goals can now be found in the care plan section)  Progress towards OT goals: Progressing toward goals  Acute Rehab OT Goals OT Goal Formulation: With patient Time For Goal Achievement: 12/31/22 Potential to Achieve Goals: Good ADL Goals Pt Will Perform Lower  Body Bathing: with mod assist;with adaptive equipment Pt Will Transfer to Toilet: with min assist;bedside commode Pt Will Perform Toileting - Clothing Manipulation and hygiene: with mod assist Pt Will Perform Tub/Shower Transfer: with mod assist;tub bench  Plan      Co-evaluation                 AM-PAC OT "6 Clicks" Daily Activity     Outcome Measure   Help from another person eating meals?: None Help from another person taking care of personal grooming?: A Little Help from another person toileting, which includes using  toliet, bedpan, or urinal?: A Lot Help from another person bathing (including washing, rinsing, drying)?: A Lot Help from another person to put on and taking off regular upper body clothing?: A Little Help from another person to put on and taking off regular lower body clothing?: Total 6 Click Score: 15    End of Session Equipment Utilized During Treatment: Rolling walker (2 wheels)  OT Visit Diagnosis: Unsteadiness on feet (R26.81);Muscle weakness (generalized) (M62.81)   Activity Tolerance Patient limited by pain   Patient Left in bed;with call bell/phone within reach;with bed alarm set   Nurse Communication Mobility status;Precautions        Time: 1610-9604 OT Time Calculation (min): 16 min  Charges: OT General Charges $OT Visit: 1 Visit OT Treatments $Self Care/Home Management : 8-22 mins   Brynn, OTR/L  Acute Rehabilitation Services Office: [phone] .   Mateo Flow 12/19/2022, 1:36 PM

## 2022-12-19 NOTE — Progress Notes (Addendum)
    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 He has made progress with his PT and has been up and walking with assistance for short distances. He has not been up since 2pm yesterday he states and is having a lot of back pain. 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 He finds the flexeril too sedating.    Physical Exam: Vitals:   12/19/22 0500 12/19/22 0748  BP: 123/79 121/82  Pulse: 86 88  Resp: 16 17  Temp:  97.6 F (36.4 C)  SpO2: 98% 90%    Dressing in place, CDI NVI  PO s/p thoracic decompression, doing well with very slow progress as expected given pathology and pre-op non-ambulatory status for SCC. Pending transfer to rehab facility  - up with PT/OT, encourage ambulation  -If possible pt should change positions, light movement q4hrs to prevent back  - Percocet for pain, baclofen for muscle spasms, will  trial baclofen instead of flexeril in hopes of less sedation/side effects - d/c to rehab when bed available with f/u in office 2 weeks PO

## 2022-12-19 NOTE — Progress Notes (Signed)
    Of note, I saw in the patient's chart in his physical therapy note that he was having right calf pain.  I contacted the patient's nurse and she stated that he has been having calf pain for several hours.  She evaluated his calf and it was not red or warm.  We did ask her to perform a Homan's maneuver and this was positive. He has been very sedentary for quite some time preoperatively and certainly postoperatively with minimal ambulation.  We are recommending a Doppler of his right lower extremity to evaluate for DVT and this was ordered.  Please contact us with results.  Jason Coop, PA-C Estill Bamberg, MD  [facility], Spine Surgery

## 2022-12-20 NOTE — Plan of Care (Signed)

## 2022-12-20 NOTE — Progress Notes (Signed)
    Patient doing well PO thoracic decompression, pending ins auth to transfer to CIR. He has been changing positions more and improving his walking which is helping his back pain and spirits. Unfortunately he has not received his muscle relaxer (baclofen) and was told none were ordered, confirmed baclofen is ordered and PRN in chart Q6hrs and will inform nurse. His pain is otherwise well controlled, eating well, Nl B/B function, progressing well.    Physical Exam: Vitals:   12/20/22 0436 12/20/22 0756  BP: 130/80 132/80  Pulse: 89 88  Resp: 18 17  Temp: 98.8 F (37.1 C) 98.2 F (36.8 C)  SpO2: 94% 94%    Dressing in place, CDI NVI  POD #3 s/p thoracic decompression for SCC, doing well. Pending ins auth for transfer to CIR  - Still substantial deficits to activity and self care secondary to [facility], steady slow progress with PT OT  - up with PT/OT, encourage ambulation  -Position change/ambulation Q4hrs minimum  - Percocet/norco for pain, depending on severity, baclofen for muscle spasms - d/c to CIR pending auth, with f/u in office 2 weeks PO

## 2022-12-20 NOTE — Plan of Care (Signed)
  Problem: Activity: Goal: Risk for activity intolerance will decrease Outcome: Progressing   Problem: Coping: Goal: Level of anxiety will decrease Outcome: Progressing   Problem: Pain Managment: Goal: General experience of comfort will improve Outcome: Progressing   

## 2022-12-20 NOTE — Progress Notes (Signed)
Physical Therapy Treatment Patient Details Name: Mario [name] MRN: [id number] DOB: [birth year] Today's Date: 12/20/2022   History of Present Illness 42 y.o. male presents to [facility] hospital on 12/14/2022 with worsening BLE weakness and numbness. 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 8/27 T7-T10 decompression surgery by [facility] PMH HTN diverticulitis bronchitis    PT Comments  Pt received in supine, agreeable to therapy session and with good participation and improved tolerance this date for OOB mobility compared with previous session. PT remains limited due to severe back pain and sensory/strength changes to BLE, pt heavily using BUE to compensate for LE weakness during standing tasks/transfers. TLSO fitted and ortho tech called to room to ensure proper fit. Pt needing up to +2 mod/maxA for sit<>stand and progressed to +1 modA from elevated chair surface to RW. Pt needing +2 for safety with gait for chair follow to perform household distance gait trials x2. Pt stood x4 reps total and was agreeable to sit OOB in chair at end of session. Pt continues to benefit from PT services to progress toward functional mobility goals.    If plan is discharge home, recommend the following: A lot of help with walking and/or transfers;A lot of help with bathing/dressing/bathroom;Help with stairs or ramp for entrance;Assistance with cooking/housework   Can travel by private vehicle        Equipment Recommendations  Rolling walker (2 wheels);Other (comment) (may have other BLE bracing needs due to decreased ankle DF pending progress)    Recommendations for Other Services Other (comment) (+2 mobility specialist)     Precautions / Restrictions Precautions Precautions: Back;Fall;Other (comment) (Contact precs) Precaution Comments: reviewed back  precautions Required Braces or Orthoses: Spinal Brace Spinal Brace: Thoracolumbosacral orthotic;Applied in sitting position Restrictions Weight Bearing Restrictions: No     Mobility  Bed Mobility Overal bed mobility: Needs Assistance Bed Mobility: Rolling, Sit to Supine Rolling: Min assist Sidelying to sit: Max assist, Used rails, +2 for safety/equipment   Sit to supine: Max assist, Used rails   General bed mobility comments: pt instructed on sequence to maintain back precautions. pt requires minA for cues for log roll to prevent twisting to sit up on R EOB and maxA for trunk raising.    Transfers Overall transfer level: Needs assistance Equipment used: Rolling walker (2 wheels) Transfers: Sit to/from Stand Sit to Stand: Max assist, +2 safety/equipment, From elevated surface           General transfer comment: Patient requiring assistance of 2 intially to stand from EOB and progressed to one person assist. Dense cues for better technique for sit<>stand, pt initially pulling RW toward him, needs cues to push down through RW on one side and bed/arm rest of chair with other arm as pt states he has to have at least one arm up on AD.    Ambulation/Gait Ambulation/Gait assistance: Mod assist, Min assist, +2 safety/equipment Gait Distance (Feet): 30 Feet (x2 with seated break between, then 9ft) Assistive device: Rolling walker (2 wheels) (bari width) Gait Pattern/deviations: Decreased step length - right, Decreased step length - left, Step-to pattern, Step-through pattern, Decreased stance time - left, Decreased dorsiflexion - right, Decreased dorsiflexion - left Gait velocity: Significant decrease in cadence.     General Gait Details: partial step through to step to gait pattern with decreased stance time on the  L compared to the R with heavy UE dependence on RW. Pt appearing to have difficulty with bil dorsiflexion and inconsistent foot placement, pt often with narrow BOS then when  cued to widen stance, brings feet wider than shoulder width. Diaphoretic once he reaches ~20-63ft and cold washcloth to forehead. SpO2 98% on RA and HR ~100 bpm with exertion. Did not check standing BP as pt not c/o dizziness. Pt had difficulty managing RW placement needed assist to advance and turn AD during session. Close chair follow for safety.   Stairs             Wheelchair Mobility     Tilt Bed    Modified Rankin (Stroke Patients Only)       Balance Overall balance assessment: Needs assistance Sitting-balance support: Bilateral upper extremity supported, Feet supported Sitting balance-Leahy Scale: Fair     Standing balance support: Bilateral upper extremity supported, During functional activity, Reliant on assistive device for balance Standing balance-Leahy Scale: Poor Standing balance comment: pt requires (A) to remain stable in RW                            Cognition Arousal: Alert Behavior During Therapy: WFL for tasks assessed/performed Overall Cognitive Status: Within Functional Limits for tasks assessed                                 General Comments: able to recall back precautions after review        Exercises Other Exercises Other Exercises: supine BLE AROM: ankle pumps (limited ROM), heel slides, hip ab/adduction x10 reps ea Other Exercises: PROM heel cord stretch x30 sec ea Other Exercises: IS x 10 reps    General Comments General comments (skin integrity, edema, etc.): SpO2/HR WFL. Diaphoretic with exertion, RN notified of pt pain score. TLSO brace adjusted with expanders for proper fit while pt sitting EOB, ortho tech called to room to check the fit toward end of session and adjust if needed.      Pertinent Vitals/Pain Pain Assessment Pain Assessment: 0-10 Pain Score: 9  Pain Location: back right leg Pain Descriptors / Indicators: Aching, Discomfort, Grimacing, Guarding Pain Intervention(s): Limited activity  within patient's tolerance, Monitored during session, Premedicated before session, Repositioned, Patient requesting pain meds-RN notified, Ice applied     PT Goals (current goals can now be found in the care plan section) Acute Rehab PT Goals PT Goal Formulation: With patient Time For Goal Achievement: 12/31/22 Progress towards PT goals: Progressing toward goals    Frequency    Min 1X/week      PT Plan      Co-evaluation PT/OT/SLP Co-Evaluation/Treatment: Yes Reason for Co-Treatment: For patient/therapist safety;To address functional/ADL transfers PT goals addressed during session: Mobility/safety with mobility;Proper use of DME;Strengthening/ROM       AM-PAC PT "6 Clicks" Mobility   Outcome Measure  Help needed turning from your back to your side while in a flat bed without using bedrails?: A Little Help needed moving from lying on your back to sitting on the side of a flat bed without using bedrails?: A Lot Help needed moving to and from a bed to a chair (including a wheelchair)?: A Lot Help needed standing up from a chair using your arms (e.g., wheelchair or bedside chair)?: A Lot Help needed to walk in hospital room?: A Lot Help needed climbing 3-5 steps with a  railing? : Total 6 Click Score: 12    End of Session Equipment Utilized During Treatment: Gait belt;Back brace Activity Tolerance: Patient tolerated treatment well;Patient limited by pain;Treatment limited secondary to medical complications (Comment);Other (comment) (diaphoretic with exertion) Patient left: in chair;with call bell/phone within reach;with family/visitor present;Other (comment) (spouse in room while pt agreeable to sit up in chair) Nurse Communication: Mobility status;Patient requests pain meds PT Visit Diagnosis: Muscle weakness (generalized) (M62.81);History of falling (Z91.81)     Time: 5409-8119 PT Time Calculation (min) (ACUTE ONLY): 50 min  Charges:    $Gait Training: 8-22  mins $Therapeutic Exercise: 8-22 mins PT General Charges $$ ACUTE PT VISIT: 1 Visit                     Brigida Scotti P., PTA Acute Rehabilitation Services Secure Chat Preferred 9a-5:30pm Office: [phone]    Dorathy Kinsman [facility] 12/20/2022, 2:16 PM

## 2022-12-20 NOTE — Plan of Care (Signed)
Problem: Education: Goal: Knowledge of General Education information will improve Description: Including pain rating scale, medication(s)/side effects and non-pharmacologic comfort measures 12/20/2022 2230 by Myrtis Ser, LPN Outcome: Progressing 12/20/2022 2230 by Myrtis Ser, LPN Outcome: Progressing   Problem: Health Behavior/Discharge Planning: Goal: Ability to manage health-related needs will improve 12/20/2022 2230 by Myrtis Ser, LPN Outcome: Progressing 12/20/2022 2230 by Myrtis Ser, LPN Outcome: Progressing   Problem: Clinical Measurements: Goal: Ability to maintain clinical measurements within normal limits will improve 12/20/2022 2230 by Myrtis Ser, LPN Outcome: Progressing 12/20/2022 2230 by Myrtis Ser, LPN Outcome: Progressing Goal: Will remain free from infection 12/20/2022 2230 by Myrtis Ser, LPN Outcome: Progressing 12/20/2022 2230 by Myrtis Ser, LPN Outcome: Progressing Goal: Diagnostic test results will improve 12/20/2022 2230 by Myrtis Ser, LPN Outcome: Progressing 12/20/2022 2230 by Myrtis Ser, LPN Outcome: Progressing Goal: Respiratory complications will improve 12/20/2022 2230 by Myrtis Ser, LPN Outcome: Progressing 12/20/2022 2230 by Myrtis Ser, LPN Outcome: Progressing Goal: Cardiovascular complication will be avoided 12/20/2022 2230 by Myrtis Ser, LPN Outcome: Progressing 12/20/2022 2230 by Myrtis Ser, LPN Outcome: Progressing   Problem: Activity: Goal: Risk for activity intolerance will decrease 12/20/2022 2230 by Myrtis Ser, LPN Outcome: Progressing 12/20/2022 2230 by Myrtis Ser, LPN Outcome: Progressing   Problem: Nutrition: Goal: Adequate nutrition will be maintained 12/20/2022 2230 by Myrtis Ser, LPN Outcome: Progressing 12/20/2022 2230 by Myrtis Ser, LPN Outcome: Progressing   Problem: Coping: Goal: Level of anxiety will decrease 12/20/2022 2230 by Myrtis Ser, LPN Outcome: Progressing 12/20/2022 2230 by Myrtis Ser,  LPN Outcome: Progressing   Problem: Elimination: Goal: Will not experience complications related to bowel motility 12/20/2022 2230 by Myrtis Ser, LPN Outcome: Progressing 12/20/2022 2230 by Myrtis Ser, LPN Outcome: Progressing Goal: Will not experience complications related to urinary retention 12/20/2022 2230 by Myrtis Ser, LPN Outcome: Progressing 12/20/2022 2230 by Myrtis Ser, LPN Outcome: Progressing   Problem: Pain Managment: Goal: General experience of comfort will improve 12/20/2022 2230 by Myrtis Ser, LPN Outcome: Progressing 12/20/2022 2230 by Myrtis Ser, LPN Outcome: Progressing   Problem: Safety: Goal: Ability to remain free from injury will improve 12/20/2022 2230 by Myrtis Ser, LPN Outcome: Progressing 12/20/2022 2230 by Myrtis Ser, LPN Outcome: Progressing   Problem: Skin Integrity: Goal: Risk for impaired skin integrity will decrease 12/20/2022 2230 by Myrtis Ser, LPN Outcome: Progressing 12/20/2022 2230 by Myrtis Ser, LPN Outcome: Progressing   Problem: Education: Goal: Ability to verbalize activity precautions or restrictions will improve 12/20/2022 2230 by Myrtis Ser, LPN Outcome: Progressing 12/20/2022 2230 by Myrtis Ser, LPN Outcome: Progressing Goal: Knowledge of the prescribed therapeutic regimen will improve 12/20/2022 2230 by Myrtis Ser, LPN Outcome: Progressing 12/20/2022 2230 by Myrtis Ser, LPN Outcome: Progressing Goal: Understanding of discharge needs will improve 12/20/2022 2230 by Myrtis Ser, LPN Outcome: Progressing 12/20/2022 2230 by Myrtis Ser, LPN Outcome: Progressing   Problem: Activity: Goal: Ability to avoid complications of mobility impairment will improve 12/20/2022 2230 by Myrtis Ser, LPN Outcome: Progressing 12/20/2022 2230 by Myrtis Ser, LPN Outcome: Progressing Goal: Ability to tolerate increased activity will improve 12/20/2022 2230 by Myrtis Ser, LPN Outcome: Progressing 12/20/2022 2230 by Myrtis Ser,  LPN Outcome: Progressing Goal: Will remain free from falls 12/20/2022 2230 by Myrtis Ser, LPN Outcome: Progressing 12/20/2022 2230 by Myrtis Ser, LPN Outcome: Progressing   Problem: Bowel/Gastric: Goal: Gastrointestinal status for postoperative course will improve 12/20/2022 2230 by Myrtis Ser, LPN Outcome: Progressing 12/20/2022 2230 by Myrtis Ser, LPN Outcome: Progressing   Problem: Clinical Measurements: Goal: Ability to maintain clinical measurements  within normal limits will improve 12/20/2022 2230 by Myrtis Ser, LPN Outcome: Progressing 12/20/2022 2230 by Myrtis Ser, LPN Outcome: Progressing Goal: Postoperative complications will be avoided or minimized 12/20/2022 2230 by Myrtis Ser, LPN Outcome: Progressing 12/20/2022 2230 by Myrtis Ser, LPN Outcome: Progressing Goal: Diagnostic test results will improve 12/20/2022 2230 by Myrtis Ser, LPN Outcome: Progressing 12/20/2022 2230 by Myrtis Ser, LPN Outcome: Progressing   Problem: Pain Management: Goal: Pain level will decrease 12/20/2022 2230 by Myrtis Ser, LPN Outcome: Progressing 12/20/2022 2230 by Myrtis Ser, LPN Outcome: Progressing   Problem: Skin Integrity: Goal: Will show signs of wound healing 12/20/2022 2230 by Myrtis Ser, LPN Outcome: Progressing 12/20/2022 2230 by Myrtis Ser, LPN Outcome: Progressing   Problem: Health Behavior/Discharge Planning: Goal: Identification of resources available to assist in meeting health care needs will improve 12/20/2022 2230 by Myrtis Ser, LPN Outcome: Progressing 12/20/2022 2230 by Myrtis Ser, LPN Outcome: Progressing   Problem: Bladder/Genitourinary: Goal: Urinary functional status for postoperative course will improve 12/20/2022 2230 by Myrtis Ser, LPN Outcome: Progressing 12/20/2022 2230 by Myrtis Ser, LPN Outcome: Progressing

## 2022-12-20 NOTE — Progress Notes (Signed)
Occupational Therapy Treatment Patient Details Name: Mario [name] MRN: [id number] DOB: [birth year] Today's Date: 12/20/2022   History of present illness 42 y.o. male presents to [facility] hospital on 12/14/2022 with worsening BLE weakness and numbness. 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 8/27 T7-T10 decompression surgery by [facility] PMH HTN diverticulitis bronchitis (Simultaneous filing. User may not have seen previous data.)   OT comments  Patient received in supine and agreeable to OT/PT session. Patient stating less right calf pain this session. Patient required instructions for log rolling technique and assistance of 2 due to pain. Patient required assistance of 2 to stand from EOB with bed elevated and progressed to one person assist on following 2 bouts of mobility. Patient will benefit from intensive inpatient follow up therapy, >3 hours/day to continue to address self care, functional transfers, and AE training. Acute OT to continue to follow.       If plan is discharge home, recommend the following:  A lot of help with walking and/or transfers;A lot of help with bathing/dressing/bathroom   Equipment Recommendations  BSC/3in1;Tub/shower bench    Recommendations for Other Services Rehab consult    Precautions / Restrictions Precautions Precautions: Back (Simultaneous filing. User may not have seen previous data.) Precaution Comments: reviewed back precautions (Simultaneous filing. User may not have seen previous data.) Required Braces or Orthoses: Spinal Brace (Simultaneous filing. User may not have seen previous data.) Spinal Brace: Thoracolumbosacral orthotic;Applied in sitting position (Simultaneous filing. User may not have seen previous data.) Restrictions Weight Bearing Restrictions: No       Mobility Bed  Mobility                    Transfers                   General transfer comment: Patient requiring assistance of 2 intially to stand from EOB and progressed to one person assist     Balance                                           ADL either performed or assessed with clinical judgement   ADL Overall ADL's : Needs assistance/impaired     Grooming: Wash/dry face;Sitting;Set up                   Toilet Transfer: Maximal assistance Toilet Transfer Details (indicate cue type and reason): simulated                Extremity/Trunk Assessment              Vision       Perception     Praxis      Cognition Arousal: Alert (Simultaneous filing. User may not have seen previous data.) Behavior During Therapy: [facility] for tasks assessed/performed (Simultaneous filing. User may not have seen previous data.) Overall Cognitive Status: Within Functional Limits for tasks assessed (Simultaneous filing. User may not have seen previous data.)                                 General Comments: able to recall back precautions after review  Exercises      Shoulder Instructions       General Comments      Pertinent Vitals/ Pain       Pain Assessment Pain Assessment: 0-10 Pain Score: 9  Pain Location: back right leg (Simultaneous filing. User may not have seen previous data.) Pain Descriptors / Indicators: Aching, Discomfort, Grimacing, Guarding (Simultaneous filing. User may not have seen previous data.) Pain Intervention(s): Limited activity within patient's tolerance, Monitored during session, Repositioned, RN gave pain meds during session  Home Living                                          Prior Functioning/Environment              Frequency  Min 1X/week        Progress Toward Goals  OT Goals(current goals can now be found in the care plan section)  Progress towards OT goals:  Progressing toward goals  Acute Rehab OT Goals OT Goal Formulation: With patient Time For Goal Achievement: 12/31/22 Potential to Achieve Goals: Good ADL Goals Pt Will Perform Lower Body Bathing: with mod assist;with adaptive equipment Pt Will Transfer to Toilet: with min assist;bedside commode Pt Will Perform Toileting - Clothing Manipulation and hygiene: with mod assist Pt Will Perform Tub/Shower Transfer: with mod assist;tub bench  Plan      Co-evaluation    PT/OT/SLP Co-Evaluation/Treatment: Yes Reason for Co-Treatment: For patient/therapist safety;To address functional/ADL transfers   OT goals addressed during session: ADL's and self-care      AM-PAC OT "6 Clicks" Daily Activity     Outcome Measure   Help from another person eating meals?: None Help from another person taking care of personal grooming?: A Little Help from another person toileting, which includes using toliet, bedpan, or urinal?: A Lot Help from another person bathing (including washing, rinsing, drying)?: A Lot Help from another person to put on and taking off regular upper body clothing?: A Little Help from another person to put on and taking off regular lower body clothing?: Total 6 Click Score: 15    End of Session Equipment Utilized During Treatment: Gait belt;Rolling walker (2 wheels);Back brace  OT Visit Diagnosis: Unsteadiness on feet (R26.81);Muscle weakness (generalized) (M62.81)   Activity Tolerance Patient tolerated treatment well   Patient Left in chair;with call bell/phone within reach;with family/visitor present   Nurse Communication Mobility status;Precautions        Time: 0865-7846 OT Time Calculation (min): 49 min  Charges: OT General Charges $OT Visit: 1 Visit OT Treatments $Self Care/Home Management : 8-22 mins  Alfonse Flavors, OTA Acute Rehabilitation Services  Office [phone]   Dewain Penning 12/20/2022, 1:50 PM

## 2022-12-21 NOTE — Progress Notes (Signed)
    Patient doing well PO thoracic decompression, pending ins auth to transfer to CIR. He has been changing positions more and improving his walking which is helping his back pain and spirits. Some increased pain about his back this morning, eager to move around. His pain is otherwise well controlled, eating well, Nl B/B function, progressing well.      Physical Exam:BP 128/78 (BP Location: Right Arm)   Pulse 86   Temp 98.9 F (37.2 C)   Resp 18   Ht 6\' 1"  (1.854 m)   Wt 129.3 kg   SpO2 93%   BMI 37.60 kg/m    Dressing in place, CDI NVI   POD #4 s/p thoracic decompression for SCC, doing well. Pending ins auth for transfer to CIR   - Still substantial deficits to activity and self care secondary to residual myelopathic symptoms, steady slow progress with PT OT   - up with PT/OT, encourage ambulation             -Position change/ambulation Q4hrs minimum  - Percocet/norco for pain, depending on severity, baclofen for muscle spasms - d/c to CIR pending auth, with f/u in office 2 weeks PO - bulky dressing can be remove PO day 5/tomorrow and pt can shower normally over steri strips

## 2022-12-22 LAB — GLUCOSE, CAPILLARY: Glucose-Capillary: 153 mg/dL — ABNORMAL HIGH (ref 70–99)

## 2022-12-22 NOTE — Progress Notes (Signed)
Occupational Therapy Treatment Patient Details Name: Mario [name] MRN: [id number] DOB: 02-11-1981 Today's Date: 12/22/2022   History of present illness 42 y.o. male presents to [facility] hospital on 12/14/2022 with worsening BLE weakness and numbness. 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 8/27 T7-T10 decompression surgery by [facility]H HTN diverticulitis bronchitis   OT comments  Pt is making steady progress towards their acute OT goals. Pt has great recall of back precautions and knowledge of his brace. TLSO orders remain "no brace needed," however pt preferred to don brace for session for pain management. Overall he transferred to sitting with up to max A for spieling to sitting. Once sitting pt was eager to attempt OOB. He was able to complete lateral scoot transfer from bed>chair with mod A +2 and completed several standing transfers with the RW and mod-max A +2, he was unable to progress to stepping this date. OT to continue to follow acutely to facilitate progress towards established goals. Pt will continue to benefit from intensive inpatient follow up therapy, >3 hours/day after discharge.        If plan is discharge home, recommend the following:  A lot of help with walking and/or transfers;A lot of help with bathing/dressing/bathroom   Equipment Recommendations  BSC/3in1;Tub/shower bench    Recommendations for Other Services Rehab consult    Precautions / Restrictions Precautions Precautions: Back;Fall;Other (comment) Precaution Comments: reviewed back precautions Required Braces or Orthoses: Spinal Brace Spinal Brace: Thoracolumbosacral orthotic;Applied in sitting position ("no brace needed ordered" pt preference to wear TLSO) Restrictions Weight Bearing Restrictions: No       Mobility Bed Mobility Overal bed  mobility: Needs Assistance Bed Mobility: Rolling, Sidelying to Sit Rolling: Min assist Sidelying to sit: Max assist, +2 for physical assistance, +2 for safety/equipment, Used rails       General bed mobility comments: cues needed    Transfers Overall transfer level: Needs assistance Equipment used: Rolling walker (2 wheels) Transfers: Sit to/from Stand, Bed to chair/wheelchair/BSC Sit to Stand: Mod assist, Max assist, +2 physical assistance, +2 safety/equipment          Lateral/Scoot Transfers: Mod assist, +2 safety/equipment, +2 physical assistance General transfer comment: unsuccessful standing attempts at EOB, but pt able to stand from chair with mod-max A +2 - unable to progress to stepping this date     Balance Overall balance assessment: Needs assistance Sitting-balance support: Feet supported Sitting balance-Leahy Scale: Fair     Standing balance support: Bilateral upper extremity supported, During functional activity, Reliant on assistive device for balance Standing balance-Leahy Scale: Poor                             ADL either performed or assessed with clinical judgement   ADL Overall ADL's : Needs assistance/impaired                         Toilet Transfer: Moderate assistance;+2 for physical assistance;+2 for safety/equipment;Rolling walker (2 wheels) Toilet Transfer Details (indicate cue type and reason): lateral scoot transfer, max A +2 for standing attempted Toileting- Clothing Manipulation and Hygiene: Maximal assistance;+2 for physical assistance;+2 for safety/equipment;Sit to/from stand       Functional mobility during ADLs: Moderate assistance;Maximal assistance;+2 for physical assistance;+2 for safety/equipment General ADL  Comments: pain limited, able to stand this date but unable to progress to stepping. mod A +2 for lateral scoot from bed>chair    Extremity/Trunk Assessment Upper Extremity Assessment Upper Extremity  Assessment: Overall WFL for tasks assessed   Lower Extremity Assessment Lower Extremity Assessment: Defer to PT evaluation        Vision   Vision Assessment?: No apparent visual deficits   Perception Perception Perception: Within Functional Limits   Praxis Praxis Praxis: WFL    Cognition Arousal: Alert Behavior During Therapy: WFL for tasks assessed/performed Overall Cognitive Status: Within Functional Limits for tasks assessed                                 General Comments: eager to work, able to recall back precautions and brace purpose              General Comments VSS On RA    Pertinent Vitals/ Pain       Pain Assessment Pain Assessment: Faces Faces Pain Scale: Hurts whole lot Pain Intervention(s): Limited activity within patient's tolerance, Monitored during session   Frequency  Min 1X/week        Progress Toward Goals  OT Goals(current goals can now be found in the care plan section)  Progress towards OT goals: Progressing toward goals  Acute Rehab OT Goals OT Goal Formulation: With patient Time For Goal Achievement: 12/31/22 Potential to Achieve Goals: Good ADL Goals Pt Will Perform Lower Body Bathing: with mod assist;with adaptive equipment Pt Will Transfer to Toilet: with min assist;bedside commode Pt Will Perform Toileting - Clothing Manipulation and hygiene: with mod assist Pt Will Perform Tub/Shower Transfer: with mod assist;tub bench  Plan      Co-evaluation    PT/OT/SLP Co-Evaluation/Treatment: Yes Reason for Co-Treatment: For patient/therapist safety;To address functional/ADL transfers   OT goals addressed during session: ADL's and self-care      AM-PAC OT "6 Clicks" Daily Activity     Outcome Measure   Help from another person eating meals?: None Help from another person taking care of personal grooming?: A Little Help from another person toileting, which includes using toliet, bedpan, or urinal?: A Lot Help  from another person bathing (including washing, rinsing, drying)?: A Lot Help from another person to put on and taking off regular upper body clothing?: A Little Help from another person to put on and taking off regular lower body clothing?: Total 6 Click Score: 15    End of Session Equipment Utilized During Treatment: Gait belt;Rolling walker (2 wheels);Back brace  OT Visit Diagnosis: Unsteadiness on feet (R26.81);Muscle weakness (generalized) (M62.81)   Activity Tolerance Patient tolerated treatment well   Patient Left in chair;with call bell/phone within reach;with family/visitor present   Nurse Communication Mobility status;Precautions        Time: 1191-4782 OT Time Calculation (min): 36 min  Charges: OT General Charges $OT Visit: 1 Visit OT Treatments $Therapeutic Activity: 8-22 mins  Derenda Mis, OTR/L Acute Rehabilitation Services Office [phone] Secure Chat Communication Preferred   Donia Pounds 12/22/2022, 12:20 PM

## 2022-12-22 NOTE — Plan of Care (Signed)
  Problem: Education: Goal: Knowledge of General Education information will improve Description: Including pain rating scale, medication(s)/side effects and non-pharmacologic comfort measures Outcome: Progressing   Problem: Health Behavior/Discharge Planning: Goal: Ability to manage health-related needs will improve Outcome: Progressing   Problem: Clinical Measurements: Goal: Ability to maintain clinical measurements within normal limits will improve Outcome: Progressing Goal: Will remain free from infection 12/22/2022 1947 by Olga Millers, LPN Outcome: Progressing 12/22/2022 1617 by Olga Millers, LPN Outcome: Progressing Goal: Respiratory complications will improve Outcome: Progressing Goal: Cardiovascular complication will be avoided Outcome: Progressing   Problem: Activity: Goal: Risk for activity intolerance will decrease 12/22/2022 1947 by Olga Millers, LPN Outcome: Progressing 12/22/2022 1617 by Olga Millers, LPN Outcome: Progressing   Problem: Nutrition: Goal: Adequate nutrition will be maintained 12/22/2022 1947 by Olga Millers, LPN Outcome: Progressing 12/22/2022 1617 by Olga Millers, LPN Outcome: Progressing   Problem: Coping: Goal: Level of anxiety will decrease Outcome: Progressing   Problem: Elimination: Goal: Will not experience complications related to bowel motility Outcome: Progressing

## 2022-12-22 NOTE — Plan of Care (Signed)

## 2022-12-22 NOTE — Progress Notes (Signed)
    Patient doing well PO thoracic decompression, pending ins auth to transfer to CIR. He has been changing positions more and improving his walking which is helping his back pain and spirits. He finds standing up to feel best. He is still having prominent weakness in his ankles limiting walking. His pain is well controlled, eating well, Nl B/B function, progressing well.    BP 123/78 (BP Location: Left Arm)   Pulse 79   Temp 97.7 F (36.5 C) (Oral)   Resp 16   Ht 6\' 1"  (1.854 m)   Wt 129.3 kg   SpO2 96%   BMI 37.60 kg/m     Dressing in place, CDI, pt resting comfortably in bed NVI   POD #5 s/p thoracic decompression for SCC/myelopathy with substantial strength and function deficits. Pending ins auth for transfer to CIR   - Still substantial deficits to activity and self care secondary to residual myelopathic symptoms, steady slow progress with PT OT   - up with PT/OT, encourage ambulation             -Position change/ambulation Q4hrs minimum  - Percocet/norco for pain, depending on severity, baclofen for muscle spasms - d/c to CIR pending auth, with f/u in office 2 weeks PO - bulky dressing can be remove PO day 5/TODAY and pt can shower normally over steri stripsth f/u in 2 weeks

## 2022-12-22 NOTE — Progress Notes (Signed)
Physical Therapy Treatment Patient Details Name: Mario [name] MRN: [id number] DOB: [birth year] Today's Date: 12/22/2022   History of Present Illness 42 y.o. male presents to [facility] hospital on 12/14/2022 with worsening BLE weakness and numbness. 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 8/27 T7-T10 decompression surgery by [facility] PMH HTN diverticulitis bronchitis    PT Comments  Continuing work on functional mobility and activity tolerance;   Pt has great recall of back precautions and knowledge of his brace. TLSO orders remain "no brace needed," however pt preferred to don brace for session for pain management. Overall he transferred to sitting with up to max A for sidelying to sitting. Once sitting pt was eager to attempt OOB. He was able to complete lateral scoot transfer from bed>chair with mod A +2 and completed several standing transfers with the RW and mod-max A +2, and bil knees blocked for stability; States standing is more comfortable; Motivated to work; Patient will benefit from intensive inpatient follow up therapy, >3 hours/day     If plan is discharge home, recommend the following: A lot of help with walking and/or transfers;A lot of help with bathing/dressing/bathroom;Help with stairs or ramp for entrance;Assistance with cooking/housework   Can travel by private vehicle        Equipment Recommendations  Rolling walker (2 wheels);Other (comment) (may have other BLE bracing needs due to decreased ankle DF pending progress)    Recommendations for Other Services Other (comment) (+2 mobility specialist)     Precautions / Restrictions Precautions Precautions: Back;Fall;Other (comment) Precaution Comments: reviewed back precautions Required Braces or Orthoses: Spinal Brace Spinal Brace: Thoracolumbosacral orthotic;Applied  in sitting position ("no brace needed ordered" pt preference to wear TLSO) Restrictions Weight Bearing Restrictions: No     Mobility  Bed Mobility Overal bed mobility: Needs Assistance Bed Mobility: Rolling, Sidelying to Sit Rolling: Min assist Sidelying to sit: Max assist, +2 for physical assistance, +2 for safety/equipment, Used rails       General bed mobility comments: cues needed    Transfers Overall transfer level: Needs assistance Equipment used: Rolling walker (2 wheels) Transfers: Sit to/from Stand, Bed to chair/wheelchair/BSC Sit to Stand: Mod assist, Max assist, +2 physical assistance, +2 safety/equipment          Lateral/Scoot Transfers: Mod assist, +2 safety/equipment, +2 physical assistance General transfer comment: unsuccessful standing attempts at EOB, but pt able to stand from chair with mod-max A +2 with knees blocked for stability - unable to progress to stepping this date    Ambulation/Gait                   Stairs             Wheelchair Mobility     Tilt Bed    Modified Rankin (Stroke Patients Only)       Balance     Sitting balance-Leahy Scale: Fair     Standing balance support: Bilateral upper extremity supported, During functional activity, Reliant on assistive device for balance Standing balance-Leahy Scale: Poor Standing balance comment: heavy dependence on UEs                            Cognition Arousal: Alert Behavior During Therapy: WFL for tasks assessed/performed Overall Cognitive Status: Within Functional Limits for tasks assessed  General Comments: eager to work, able to recall back precautions and brace purpose        Exercises      General Comments General comments (skin integrity, edema, etc.): VSS On RA      Pertinent Vitals/Pain Pain Assessment Pain Assessment: Faces Faces Pain Scale: Hurts whole lot Pain Descriptors / Indicators:  Aching, Discomfort, Grimacing, Guarding Pain Intervention(s): Monitored during session, Limited activity within patient's tolerance    Home Living                          Prior Function            PT Goals (current goals can now be found in the care plan section) Acute Rehab PT Goals Patient Stated Goal: Get back to normal PT Goal Formulation: With patient Time For Goal Achievement: 12/31/22 Potential to Achieve Goals: Good Progress towards PT goals: Progressing toward goals (slowly)    Frequency    Min 1X/week      PT Plan      Co-evaluation PT/OT/SLP Co-Evaluation/Treatment: Yes Reason for Co-Treatment: For patient/therapist safety;To address functional/ADL transfers PT goals addressed during session: Mobility/safety with mobility;Proper use of DME;Strengthening/ROM OT goals addressed during session: ADL's and self-care      AM-PAC PT "6 Clicks" Mobility   Outcome Measure  Help needed turning from your back to your side while in a flat bed without using bedrails?: A Little Help needed moving from lying on your back to sitting on the side of a flat bed without using bedrails?: A Lot Help needed moving to and from a bed to a chair (including a wheelchair)?: A Lot Help needed standing up from a chair using your arms (e.g., wheelchair or bedside chair)?: Total Help needed to walk in hospital room?: Total Help needed climbing 3-5 steps with a railing? : Total 6 Click Score: 10    End of Session Equipment Utilized During Treatment: Gait belt;Back brace Activity Tolerance: Patient tolerated treatment well;Patient limited by pain;Treatment limited secondary to medical complications (Comment);Other (comment) Patient left: in chair;with call bell/phone within reach;with chair alarm set Nurse Communication: Mobility status PT Visit Diagnosis: Muscle weakness (generalized) (M62.81);History of falling (Z91.81)     Time: 8295-6213 PT Time Calculation (min)  (ACUTE ONLY): 30 min  Charges:    $Therapeutic Activity: 8-22 mins PT General Charges $$ ACUTE PT VISIT: 1 Visit                     Van Clines, PT  Acute Rehabilitation Services Office [phone] Secure Chat welcomed    Levi Aland 12/22/2022, 2:47 PM

## 2022-12-22 NOTE — Plan of Care (Signed)

## 2022-12-22 NOTE — Progress Notes (Addendum)
Pt states that he has not been able to have a BM for over a week , but he is passing gas at least, bowel sounds are active, no tenderness. Suggested pt to administer PRN fleet enema or Senna and he stated that he would like it to be administered in the morning.Will notify this to on coming nurse.  Orson Gear LPN

## 2022-12-23 NOTE — Progress Notes (Signed)
Physical Therapy Treatment Patient Details Name: Mario [name] MRN: [id number] DOB: 03/10/1981 Today's Date: 12/23/2022   History of Present Illness 42 y.o. male presents to [facility] hospital on 12/14/2022 with worsening BLE weakness and numbness. 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 8/27 T7-T10 decompression surgery by [facility] PMH HTN diverticulitis bronchitis    PT Comments  Continuing work on functional mobility and activity tolerance;  Session focused on functional transfers, with very nice improvements in lateral scooting and sit<>stand compared to previous session; Min assist for lateral scooting to R towards recliner (rec 2nd person to stabilize recliner, which mvoed slightly even though locked); Better sit to stand as well, used stedy today with good results; then pt worked on end-range sit<>stands form higher stedy-flaps-seat;   Watching bil LEs for the tendency to push back into genu recurvatum for stance stability, R more pronounced than L; Manual contact to monitor for weakness/knee hyperextension, and stabilize/facilitate as needed; performed mini-squats within stedy; able to alternate single limb stance in preliminary march in place; will consider RW for gait next session (with close chair follow    If plan is discharge home, recommend the following: A lot of help with walking and/or transfers;A lot of help with bathing/dressing/bathroom;Help with stairs or ramp for entrance;Assistance with cooking/housework   Can travel by private Scientist, research (medical) walker (2 wheels);BSC/3in1 (will consider LE bracing needs, possible AFO RLE)    Recommendations for Other Services  (Mobility)     Precautions / Restrictions Precautions Precautions: Back;Fall;Other (comment) Precaution Comments:  reviewed back precautions Required Braces or Orthoses: Spinal Brace Spinal Brace: Thoracolumbosacral orthotic;Applied in sitting position ("no brace needed" in order set, but pt prefers to wear brace) Restrictions Weight Bearing Restrictions: No     Mobility  Bed Mobility Overal bed mobility: Needs Assistance Bed Mobility: Rolling, Sidelying to Sit Rolling: Min assist Sidelying to sit: Min assist, +2 for safety/equipment       General bed mobility comments: Much improved compared to last session    Transfers Overall transfer level: Needs assistance Equipment used: Ambulation equipment used Transfers: Sit to/from Stand, Bed to chair/wheelchair/BSC Sit to Stand: Mod assist (and min assist from high stedy seat flaps)          Lateral/Scoot Transfers: Min assist, +2 safety/equipment General transfer comment: performed lateral scoot bed to recliner with min assist (second person steadying recliner chair; stood form recliner to stedy with light mod assist, then performed serial sit<>stands from high stedy seat    Ambulation/Gait             Pre-gait activities: March in place within [employer]    Modified Rankin (Stroke Patients Only)       Balance     Sitting balance-Leahy Scale: Fair       Standing balance-Leahy Scale: Poor                              Cognition Arousal: Alert Behavior During Therapy: WFL for tasks assessed/performed Overall Cognitive Status: Within Functional Limits for tasks assessed  General Comments: eager to work, able to recall back precautions and brace purpose        Exercises Other Exercises Other Exercises: mini-squats in stedy; 4 sets of 3 reps with bil UE support    General Comments        Pertinent Vitals/Pain Pain Assessment Pain Assessment: Faces Faces Pain Scale: Hurts little more Pain  Location: back right leg Pain Descriptors / Indicators: Aching, Discomfort, Grimacing, Guarding Pain Intervention(s): Monitored during session    Home Living                          Prior Function            PT Goals (current goals can now be found in the care plan section) Acute Rehab PT Goals Patient Stated Goal: Get back to normal PT Goal Formulation: With patient Time For Goal Achievement: 12/31/22 Potential to Achieve Goals: Good Progress towards PT goals: Progressing toward goals    Frequency    Min 1X/week      PT Plan      Co-evaluation              AM-PAC PT "6 Clicks" Mobility   Outcome Measure  Help needed turning from your back to your side while in a flat bed without using bedrails?: A Little Help needed moving from lying on your back to sitting on the side of a flat bed without using bedrails?: A Little Help needed moving to and from a bed to a chair (including a wheelchair)?: A Lot Help needed standing up from a chair using your arms (e.g., wheelchair or bedside chair)?: A Lot Help needed to walk in hospital room?: A Lot Help needed climbing 3-5 steps with a railing? : Total 6 Click Score: 13    End of Session Equipment Utilized During Treatment: Gait belt;Back brace Activity Tolerance: Patient tolerated treatment well Patient left: in chair;with call bell/phone within reach;with chair alarm set Nurse Communication: Mobility status PT Visit Diagnosis: Muscle weakness (generalized) (M62.81);History of falling (Z91.81)     Time: 1610-9604 PT Time Calculation (min) (ACUTE ONLY): 26 min  Charges:    $Therapeutic Exercise: 8-22 mins $Therapeutic Activity: 8-22 mins PT General Charges $$ ACUTE PT VISIT: 1 Visit                     Van Clines, PT  Acute Rehabilitation Services Office [phone] Secure Chat welcomed    Levi Aland 12/23/2022, 6:58 PM

## 2022-12-23 NOTE — Progress Notes (Signed)
Pt continues to be comfortable with controlled pain. 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 He is continuing to make slow steady progress with PT and requires substantial rehab. Pending insurance authorization to transfer to CIR. Pt able to bathe normally over steri-stips at this time.

## 2022-12-23 NOTE — Plan of Care (Signed)

## 2022-12-23 NOTE — Progress Notes (Signed)
Inpatient Rehabilitation Admissions Coordinator   I continue to wait insurance approval for possible cir admit with [facility].  Ottie Glazier, RN, MSN Rehab Admissions Coordinator [phone] 12/23/2022 12:00 PM

## 2022-12-24 ENCOUNTER — Encounter (HOSPITAL_COMMUNITY): Payer: Self-pay | Admitting: Orthopedic Surgery

## 2022-12-24 ENCOUNTER — Other Ambulatory Visit (HOSPITAL_COMMUNITY): Payer: Self-pay

## 2022-12-24 ENCOUNTER — Inpatient Hospital Stay (HOSPITAL_COMMUNITY): Payer: BC Managed Care – PPO

## 2022-12-24 ENCOUNTER — Inpatient Hospital Stay (HOSPITAL_COMMUNITY)
Admission: RE | Admit: 2022-12-24 | Discharge: 2023-01-09 | DRG: 052 | Disposition: A | Discharge status: Home / Self Care | Payer: BC Managed Care – PPO | Source: Intra-hospital | Attending: Physical Medicine and Rehabilitation | Admitting: Physical Medicine and Rehabilitation

## 2022-12-24 ENCOUNTER — Encounter (HOSPITAL_COMMUNITY): Payer: Self-pay | Admitting: Physical Medicine and Rehabilitation

## 2022-12-24 ENCOUNTER — Other Ambulatory Visit: Payer: Self-pay

## 2022-12-24 DIAGNOSIS — S025XXD Fracture of tooth (traumatic), subsequent encounter for fracture with routine healing: Secondary | ICD-10-CM

## 2022-12-24 DIAGNOSIS — M7918 Myalgia, other site: Secondary | ICD-10-CM | POA: Diagnosis present

## 2022-12-24 DIAGNOSIS — S24103S Unspecified injury at T7-T10 level of thoracic spinal cord, sequela: Secondary | ICD-10-CM

## 2022-12-24 DIAGNOSIS — M4804 Spinal stenosis, thoracic region: Secondary | ICD-10-CM | POA: Diagnosis present

## 2022-12-24 DIAGNOSIS — S22079D Unspecified fracture of T9-T10 vertebra, subsequent encounter for fracture with routine healing: Secondary | ICD-10-CM

## 2022-12-24 DIAGNOSIS — S24103D Unspecified injury at T7-T10 level of thoracic spinal cord, subsequent encounter: Secondary | ICD-10-CM

## 2022-12-24 DIAGNOSIS — S24109S Unspecified injury at unspecified level of thoracic spinal cord, sequela: Secondary | ICD-10-CM | POA: Diagnosis not present

## 2022-12-24 DIAGNOSIS — Z8249 Family history of ischemic heart disease and other diseases of the circulatory system: Secondary | ICD-10-CM

## 2022-12-24 DIAGNOSIS — F4323 Adjustment disorder with mixed anxiety and depressed mood: Secondary | ICD-10-CM | POA: Diagnosis present

## 2022-12-24 DIAGNOSIS — K5900 Constipation, unspecified: Secondary | ICD-10-CM | POA: Diagnosis present

## 2022-12-24 DIAGNOSIS — S22089D Unspecified fracture of T11-T12 vertebra, subsequent encounter for fracture with routine healing: Secondary | ICD-10-CM | POA: Diagnosis not present

## 2022-12-24 DIAGNOSIS — G8220 Paraplegia, unspecified: Secondary | ICD-10-CM | POA: Diagnosis not present

## 2022-12-24 DIAGNOSIS — K592 Neurogenic bowel, not elsewhere classified: Secondary | ICD-10-CM | POA: Diagnosis present

## 2022-12-24 DIAGNOSIS — R739 Hyperglycemia, unspecified: Secondary | ICD-10-CM | POA: Diagnosis not present

## 2022-12-24 DIAGNOSIS — I10 Essential (primary) hypertension: Secondary | ICD-10-CM | POA: Diagnosis present

## 2022-12-24 DIAGNOSIS — G8222 Paraplegia, incomplete: Secondary | ICD-10-CM | POA: Diagnosis present

## 2022-12-24 DIAGNOSIS — M47816 Spondylosis without myelopathy or radiculopathy, lumbar region: Secondary | ICD-10-CM | POA: Diagnosis present

## 2022-12-24 DIAGNOSIS — M47817 Spondylosis without myelopathy or radiculopathy, lumbosacral region: Secondary | ICD-10-CM | POA: Diagnosis present

## 2022-12-24 DIAGNOSIS — E871 Hypo-osmolality and hyponatremia: Secondary | ICD-10-CM | POA: Diagnosis present

## 2022-12-24 DIAGNOSIS — R258 Other abnormal involuntary movements: Secondary | ICD-10-CM | POA: Diagnosis present

## 2022-12-24 DIAGNOSIS — Z79899 Other long term (current) drug therapy: Secondary | ICD-10-CM

## 2022-12-24 DIAGNOSIS — F17210 Nicotine dependence, cigarettes, uncomplicated: Secondary | ICD-10-CM | POA: Diagnosis present

## 2022-12-24 DIAGNOSIS — E119 Type 2 diabetes mellitus without complications: Secondary | ICD-10-CM | POA: Diagnosis present

## 2022-12-24 DIAGNOSIS — S24109A Unspecified injury at unspecified level of thoracic spinal cord, initial encounter: Secondary | ICD-10-CM | POA: Insufficient documentation

## 2022-12-24 DIAGNOSIS — R262 Difficulty in walking, not elsewhere classified: Secondary | ICD-10-CM | POA: Diagnosis present

## 2022-12-24 DIAGNOSIS — R609 Edema, unspecified: Secondary | ICD-10-CM | POA: Diagnosis not present

## 2022-12-24 DIAGNOSIS — Z83438 Family history of other disorder of lipoprotein metabolism and other lipidemia: Secondary | ICD-10-CM | POA: Diagnosis not present

## 2022-12-24 DIAGNOSIS — S24109D Unspecified injury at unspecified level of thoracic spinal cord, subsequent encounter: Secondary | ICD-10-CM | POA: Diagnosis present

## 2022-12-24 DIAGNOSIS — M4714 Other spondylosis with myelopathy, thoracic region: Secondary | ICD-10-CM | POA: Diagnosis present

## 2022-12-24 DIAGNOSIS — Z7984 Long term (current) use of oral hypoglycemic drugs: Secondary | ICD-10-CM | POA: Diagnosis not present

## 2022-12-24 DIAGNOSIS — K5901 Slow transit constipation: Secondary | ICD-10-CM | POA: Diagnosis not present

## 2022-12-24 DIAGNOSIS — G4700 Insomnia, unspecified: Secondary | ICD-10-CM | POA: Diagnosis not present

## 2022-12-24 DIAGNOSIS — Z833 Family history of diabetes mellitus: Secondary | ICD-10-CM

## 2022-12-24 DIAGNOSIS — R252 Cramp and spasm: Secondary | ICD-10-CM | POA: Diagnosis not present

## 2022-12-24 LAB — GLUCOSE, CAPILLARY
Glucose-Capillary: 165 mg/dL — ABNORMAL HIGH (ref 70–99)
Glucose-Capillary: 174 mg/dL — ABNORMAL HIGH (ref 70–99)

## 2022-12-24 LAB — HEMOGLOBIN A1C
Hgb A1c MFr Bld: 6.7 % — ABNORMAL HIGH (ref 4.8–5.6)
Mean Plasma Glucose: 145.59 mg/dL

## 2022-12-24 MED ORDER — TRAZODONE HCL 50 MG PO TABS
25.0000 mg | ORAL_TABLET | Freq: Every evening | ORAL | Status: DC | PRN
  Administered 2022-12-24 – 2022-12-26 (×3): 50 mg via ORAL
  Filled 2022-12-24 (×4): qty 1

## 2022-12-24 MED ORDER — INSULIN ASPART 100 UNIT/ML IJ SOLN
0.0000 [IU] | Freq: Three times a day (TID) | INTRAMUSCULAR | Status: DC
  Administered 2022-12-24: 2 [IU] via SUBCUTANEOUS
  Administered 2022-12-25: 1 [IU] via SUBCUTANEOUS
  Administered 2022-12-27: 2 [IU] via SUBCUTANEOUS
  Administered 2022-12-30 – 2023-01-01 (×4): 1 [IU] via SUBCUTANEOUS

## 2022-12-24 MED ORDER — BACLOFEN 5 MG HALF TABLET
5.0000 mg | ORAL_TABLET | Freq: Every day | ORAL | Status: DC
  Administered 2022-12-24 – 2022-12-28 (×5): 5 mg via ORAL
  Filled 2022-12-24 (×5): qty 1

## 2022-12-24 MED ORDER — METFORMIN HCL ER 500 MG PO TB24
500.0000 mg | ORAL_TABLET | Freq: Every day | ORAL | Status: DC
  Administered 2022-12-25 – 2023-01-09 (×16): 500 mg via ORAL
  Filled 2022-12-24 (×16): qty 1

## 2022-12-24 MED ORDER — DIPHENHYDRAMINE HCL 25 MG PO CAPS: 25.0000 mg | ORAL_CAPSULE | Freq: Four times a day (QID) | ORAL | Status: DC | PRN

## 2022-12-24 MED ORDER — BACLOFEN 5 MG HALF TABLET
5.0000 mg | ORAL_TABLET | Freq: Three times a day (TID) | ORAL | Status: DC | PRN
  Administered 2022-12-25: 5 mg via ORAL
  Filled 2022-12-24: qty 1

## 2022-12-24 MED ORDER — BACLOFEN 10 MG PO TABS: 10.0000 mg | ORAL_TABLET | Freq: Four times a day (QID) | ORAL | Status: DC | PRN

## 2022-12-24 MED ORDER — SENNOSIDES-DOCUSATE SODIUM 8.6-50 MG PO TABS
2.0000 | ORAL_TABLET | Freq: Two times a day (BID) | ORAL | Status: DC
  Administered 2022-12-24 – 2023-01-04 (×8): 2 via ORAL
  Filled 2022-12-24 (×19): qty 2

## 2022-12-24 MED ORDER — ALUM & MAG HYDROXIDE-SIMETH 200-200-20 MG/5ML PO SUSP: 30.0000 mL | ORAL | Status: DC | PRN

## 2022-12-24 MED ORDER — POLYETHYLENE GLYCOL 3350 17 G PO PACK
34.0000 g | PACK | Freq: Once | ORAL | Status: AC
  Administered 2022-12-24: 34 g via ORAL
  Filled 2022-12-24: qty 2

## 2022-12-24 MED ORDER — FLEET ENEMA RE ENEM
1.0000 | ENEMA | Freq: Once | RECTAL | Status: DC | PRN
  Filled 2022-12-24: qty 1

## 2022-12-24 MED ORDER — ACETAMINOPHEN 325 MG PO TABS: 325.0000 mg | ORAL_TABLET | ORAL | Status: DC | PRN

## 2022-12-24 MED ORDER — BISACODYL 10 MG RE SUPP: 10.0000 mg | Freq: Every day | RECTAL | Status: DC | PRN

## 2022-12-24 MED ORDER — INSULIN ASPART 100 UNIT/ML IJ SOLN: 0.0000 [IU] | Freq: Every day | INTRAMUSCULAR | Status: DC

## 2022-12-24 MED ORDER — GUAIFENESIN-DM 100-10 MG/5ML PO SYRP: 5.0000 mL | ORAL_SOLUTION | Freq: Four times a day (QID) | ORAL | Status: DC | PRN

## 2022-12-24 MED ORDER — PROCHLORPERAZINE 25 MG RE SUPP: 12.5000 mg | Freq: Four times a day (QID) | RECTAL | Status: DC | PRN

## 2022-12-24 MED ORDER — OXYCODONE-ACETAMINOPHEN 5-325 MG PO TABS
1.0000 | ORAL_TABLET | ORAL | Status: DC | PRN
  Administered 2022-12-24 – 2022-12-28 (×17): 2 via ORAL
  Administered 2022-12-28: 1 via ORAL
  Administered 2022-12-29 (×2): 2 via ORAL
  Administered 2022-12-29: 1 via ORAL
  Administered 2022-12-29 – 2023-01-02 (×16): 2 via ORAL
  Administered 2023-01-02: 1 via ORAL
  Administered 2023-01-03: 2 via ORAL
  Administered 2023-01-03: 1 via ORAL
  Administered 2023-01-03 – 2023-01-04 (×3): 2 via ORAL
  Administered 2023-01-04: 1 via ORAL
  Administered 2023-01-04 – 2023-01-06 (×10): 2 via ORAL
  Administered 2023-01-07: 1 via ORAL
  Administered 2023-01-07 – 2023-01-09 (×8): 2 via ORAL
  Filled 2022-12-24 (×3): qty 2
  Filled 2022-12-24: qty 1
  Filled 2022-12-24 (×10): qty 2
  Filled 2022-12-24: qty 1
  Filled 2022-12-24 (×31): qty 2
  Filled 2022-12-24: qty 1
  Filled 2022-12-24 (×12): qty 2
  Filled 2022-12-24: qty 1
  Filled 2022-12-24 (×2): qty 2
  Filled 2022-12-24: qty 1
  Filled 2022-12-24 (×2): qty 2

## 2022-12-24 MED ORDER — IRBESARTAN 75 MG PO TABS
75.0000 mg | ORAL_TABLET | Freq: Every day | ORAL | Status: DC
  Administered 2022-12-25 – 2023-01-09 (×16): 75 mg via ORAL
  Filled 2022-12-24 (×16): qty 1

## 2022-12-24 MED ORDER — PROCHLORPERAZINE EDISYLATE 10 MG/2ML IJ SOLN: 5.0000 mg | Freq: Four times a day (QID) | INTRAMUSCULAR | Status: DC | PRN

## 2022-12-24 MED ORDER — ACETAMINOPHEN 325 MG PO TABS
325.0000 mg | ORAL_TABLET | ORAL | Status: DC | PRN
  Administered 2022-12-29: 650 mg via ORAL
  Filled 2022-12-24 (×2): qty 2

## 2022-12-24 MED ORDER — OXYCODONE-ACETAMINOPHEN 5-325 MG PO TABS
1.0000 | ORAL_TABLET | ORAL | 0 refills | Status: DC | PRN
  Filled 2022-12-24: qty 30, 3d supply, fill #0

## 2022-12-24 MED ORDER — PROCHLORPERAZINE MALEATE 5 MG PO TABS: 5.0000 mg | ORAL_TABLET | Freq: Four times a day (QID) | ORAL | Status: DC | PRN

## 2022-12-24 MED ORDER — BACLOFEN 10 MG PO TABS
10.0000 mg | ORAL_TABLET | Freq: Four times a day (QID) | ORAL | 0 refills | Status: DC | PRN
Start: 2022-12-24 — End: 2023-01-08
  Filled 2022-12-24: qty 30, 8d supply, fill #0

## 2022-12-24 MED ORDER — BACLOFEN 5 MG HALF TABLET: 5.0000 mg | ORAL_TABLET | Freq: Four times a day (QID) | ORAL | Status: DC | PRN

## 2022-12-24 NOTE — H&P (Incomplete)
Physical Medicine and Rehabilitation Admission H&P    Chief Complaint  Patient presents with   Functional decline due to myelopathy    HPI:  Mario [name] is a 42 year old male with history of HTN, T2DM, diverticulitis, back pain/strain in May who started developing balance issues fall and numbness from waist down. He had seen NS with plans for surgery but was admitted on 12/14/22 with worsening of pain, increase in numbness and increase in BLE weakness w/difficulty walking. He was admitted for work up and evaluated by Dr. 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 UDS positive for opiates and cocaine. He underwent T7-T10 laminectomy with bilateral partial facetectomy and spinal cord decompression on 08/27.   Post op has had some improvement in weakness but continues to have R>LLE weakness and working on standing in [place]. Back brace ordered per patient preference. He continues to be limited by weakness, numbness from waist down as well as LE spasms. He was independent and working till 09/2022 but had steady decline needing cane to walker 3 weeks PTA--CIR recommended due to functional decline.  was   Review of Systems  Constitutional:  Negative for chills and fever.  HENT:  Negative for hearing loss and tinnitus.   Eyes:  Positive for blurred vision (once or twice -resolved --last a few seconds.). Negative for double vision.  Respiratory:  Negative for cough and shortness of breath.   Cardiovascular:  Negative for chest pain, palpitations and leg swelling.  Gastrointestinal:  Positive for constipation. Negative for heartburn.  Genitourinary:  Negative for dysuria and urgency.  Musculoskeletal:  Positive for joint pain (right ankle pain) and myalgias.  Skin:   Negative for itching and rash.  Neurological:  Positive for sensory change and weakness. Negative for dizziness and headaches.  Psychiatric/Behavioral:  The patient is not nervous/anxious.      Past Medical History:  Diagnosis Date   Bronchitis    Diabetes (HCC)    Diverticulitis 10/2015   Hypertension     Past Surgical History:  Procedure Laterality Date   LUMBAR LAMINECTOMY/DECOMPRESSION MICRODISCECTOMY N/A 12/16/2022   Procedure: THORACIC DECOMPRESSION;  Surgeon: Estill Bamberg, MD;  Location: [facility] OR;  Service: Orthopedics;  Laterality: N/A;    Family History  Problem Relation Age of Onset   Hyperlipidemia Mother    Diabetes Mother    Heart disease Father      Social History:  Married. Has 3 boys 70, 71 and 41 years old.  Works at [employer] been out of work since 09/29/22. Wife has had stroke but is independent.  He  reports that he has been smoking cigarettes--1/2 to 1/3 pack per day.  He has never used smokeless tobacco. He reports current alcohol use--beers on the weekend.  He reports that he does not currently use drugs after having used the following drugs: Cocaine.   Allergies: No Known Allergies   Medications Prior to Admission  Medication Sig Dispense Refill   acetaminophen (TYLENOL) 650 MG CR tablet Take 1,300 mg by mouth 2 (two) times daily as needed for pain.     baclofen (LIORESAL) 10 MG tablet Take 1 tablet (10 mg total) by mouth 4 (four) times daily as needed for muscle spasms. 30 each 0   fenofibrate (TRICOR) 145 MG tablet  Take by mouth. (Patient not taking: Reported on 12/15/2022)     metFORMIN (GLUCOPHAGE-XR) 500 MG 24 hr tablet Take 1 tablet by mouth daily with breakfast.     oxyCODONE-acetaminophen (PERCOCET/ROXICET) 5-325 MG tablet Take 1-2 tablets by mouth every 4 (four) hours as needed for severe pain. 30 tablet 0   valsartan (DIOVAN) 80 MG tablet Take 1 tablet by mouth daily.        Home: Home Living Family/patient expects to be  discharged to:: Private residence Living Arrangements: Spouse/significant other (and teenage children) Available Help at Discharge: Family, Available 24 hours/day (at or near 24 hours) Type of Home: Apartment Home Access: Stairs to enter [employer] of Steps: 1 Entrance Stairs-Rails: None Home Layout: 1/2 bath on main level, Bed/bath upstairs Alternate Level Stairs-Number of Steps: Flight Bathroom Shower/Tub: Engineer, manufacturing systems: Standard Bathroom Accessibility: Yes  Lives With: Spouse (teenage children)   Functional History: Prior Function Prior Level of Function :  (Mod I with RW since May; worked and drove prior to that) ADLs Comments:  (patient reporting since June, his wife has been having to help him with LB ADLs. Prior to this was Independent in ADLs and driving.  Wife took care of IADLs)  Functional Status:  Mobility: Bed Mobility Overal bed mobility: Needs Assistance Bed Mobility: Rolling, Sidelying to Sit Rolling: Min assist Sidelying to sit: Min assist, +2 for safety/equipment Sit to supine: Max assist, Used rails General bed mobility comments: Much improved compared to last session Transfers Overall transfer level: Needs assistance Equipment used: Ambulation equipment used Transfers: Sit to/from Stand, Bed to chair/wheelchair/BSC Sit to Stand: Mod assist (and min assist from high stedy seat flaps) Bed to/from chair/wheelchair/BSC transfer type:: Lateral/scoot transfer  Lateral/Scoot Transfers: Min assist, +2 safety/equipment General transfer comment: performed lateral scoot bed to recliner with min assist (second person steadying recliner chair; stood form recliner to stedy with light mod assist, then performed serial sit<>stands from high stedy seat Ambulation/Gait Ambulation/Gait assistance: Mod assist, Min assist, +2 safety/equipment Gait Distance (Feet): 30 Feet (x2 with seated break between, then 69ft) Assistive device: Rolling walker (2  wheels) (bari width) Gait Pattern/deviations: Decreased step length - right, Decreased step length - left, Step-to pattern, Step-through pattern, Decreased stance time - left, Decreased dorsiflexion - right, Decreased dorsiflexion - left General Gait Details: partial step through to step to gait pattern with decreased stance time on the L compared to the R with heavy UE dependence on RW. Pt appearing to have difficulty with bil dorsiflexion and inconsistent foot placement, pt often with narrow BOS then when cued to widen stance, brings feet wider than shoulder width. Diaphoretic once he reaches ~20-41ft and cold washcloth to forehead. SpO2 98% on RA and HR ~100 bpm with exertion. Did not check standing BP as pt not c/o dizziness. Pt had difficulty managing RW placement needed assist to advance and turn AD during session. Close chair follow for safety. Gait velocity: Significant decrease in cadence. Gait velocity interpretation: <1.31 ft/sec, indicative of household ambulator Pre-gait activities: March in place within stedy    ADL: ADL Overall ADL's : Needs assistance/impaired Eating/Feeding: Set up, Sitting Grooming: Wash/dry face, Sitting, Set up Upper Body Bathing: Minimal assistance, Sitting Lower Body Bathing: Maximal assistance Upper Body Dressing : Moderate assistance, Sitting Lower Body Dressing: Total assistance, Sitting/lateral leans Toilet Transfer: Moderate assistance, +2 for physical assistance, +2 for safety/equipment, Rolling walker (2 wheels) Toilet Transfer Details (indicate cue type and reason): lateral scoot transfer, max A +2 for standing  attempted Toileting- Clothing Manipulation and Hygiene: Maximal assistance, +2 for physical assistance, +2 for safety/equipment, Sit to/from stand Functional mobility during ADLs: Moderate assistance, Maximal assistance, +2 for physical assistance, +2 for safety/equipment General ADL Comments: pain limited, able to stand this date but unable  to progress to stepping. mod A +2 for lateral scoot from bed>chair  Cognition: Cognition Overall Cognitive Status: Within Functional Limits for tasks assessed Orientation Level: Oriented X4 Cognition Arousal: Alert Behavior During Therapy: WFL for tasks assessed/performed Overall Cognitive Status: Within Functional Limits for tasks assessed General Comments: eager to work, able to recall back precautions and brace purpose  Physical Exam: Blood pressure 119/73, pulse 88, temperature 97.8 F (36.6 C), resp. rate 18, height 6\' 1"  (1.854 m), weight 129.3 kg, SpO2 96%. Physical Exam  Results for orders placed or performed during the hospital encounter of 12/14/22 (from the past 48 hour(s))  Glucose, capillary     Status: Abnormal   Collection Time: 12/22/22  7:36 PM  Result Value Ref Range   Glucose-Capillary 153 (H) 70 - 99 mg/dL    Comment: Glucose reference range applies only to samples taken after fasting for at least 8 hours.   No results found.    Blood pressure 119/73, pulse 88, temperature 97.8 F (36.6 C), resp. rate 18, height 6\' 1"  (1.854 m), weight 129.3 kg, SpO2 96%.  Medical Problem List and Plan: 1. Functional deficits secondary to ***  -patient may *** shower  -ELOS/Goals: *** 2.  Antithrombotics: -DVT/anticoagulation:  Mechanical: Sequential compression devices, below knee Bilateral lower extremities  -antiplatelet therapy: *** 3. Pain Management:  N/A 4. Mood/Behavior/Sleep: LCSW to follow for evaluation and support.   -antipsychotic agents: N/A  --trazodone prn for insomnia.  5. Neuropsych/cognition: This patient is capable of making decisions on his own behalf. 6. Skin/Wound Care: Routine pressure relief measures. 7. Fluids/Electrolytes/Nutrition: Monitor I/O. Check post op labs in am 8. HTN: Monitor BP TID--continue avapro for diovan 9. T2DM: Hgb A1c- Monitor BS ac/hs and use SSI for elevated BS  --continue metformin daily 10. Constipation: Enema  administered today on acute as no BM since admission-->KUB showed moderate to large stool burden.   --increase Senna S to 2 tabs bid. Mirlax 34 ounces today.  11. Spasticity: Does not like how baclofen makes him "weak".  --Discussed      ***  [name], PA-C 12/24/2022

## 2022-12-24 NOTE — Progress Notes (Signed)
Mario Rouge, MD  Physician Physical Medicine and Rehabilitation   Consult Note    Signed   Date of Service: 12/18/2022  1:42 PM  Related encounter: ED to Hosp-Admission (Discharged) from 12/14/2022 in [facility]   Signed     Expand All Collapse All  Show:Clear all [x] Written[x] Templated[] Copied  Added by: [x] Lovorn, Aundra Millet, MD  [] Hover for details          Physical Medicine and Rehabilitation Consult Reason for Consult:CIR/acute rehab Referring Physician: Dr Estill Bamberg     HPI: Mario [name] is a 42 y.o. R handed male  with hx of HTN, diverticulosis who had worsening/progressive LE weakness and numbness who saw Ortho Spine- however before surgery was able to be done, he was admitted through ED 12/14/22 for progressive LE weakness to the point he could not walk anymore.    He underwent T7-T10 decompression and lami by Dr Yevette Edwards  and patient reports spasms and tightness as well as strength are all somewhat better since surgery, however pain is significant and cannot sleep or get comfortable due to pain- he describes as aching and throbbing.    Notes he got Percocet in ED, so thinks that's why (+) for opiates in ED- but didn't give a reason for (+) cocaine on UDS.    Also notes, emptying when he pees- and doesn't feel like needs to go 30-60 minutes later.  No BM since Saturday or Sunday- since before admission. However has attempted to go 3 different times but always passed gas. Doesn't feel constipated.    Also c/o numbness from waist down- that hasn't gotten much better since surgery.  Also note tongue going numb intermittently.  Doesn't know why.   They pulled drain 15 minutes ago- painful.  Not sleeping at night due to pain.      Of note, saw from chart was written that pt was on percocet and Robaxin, however is on Flexeril and Norco, NOT the Percocet/Robaxin.    Pt notes had hiccups until given meds yesterday- made  them go away.    Review of Systems  Constitutional:  Positive for malaise/fatigue. Negative for chills and weight loss.  HENT: Negative.    Eyes: Negative.   Respiratory:  Negative for cough, sputum production and wheezing.   Cardiovascular:  Negative for chest pain, orthopnea and leg swelling.  Gastrointestinal:  Positive for constipation. Negative for diarrhea, nausea and vomiting.  Genitourinary: Negative.   Musculoskeletal:  Positive for back pain, falls and myalgias.  Skin: Negative.   Neurological:  Positive for sensory change, focal weakness (Lastly, notes, that R leg was stronger before surgery, but is now weaker than L) and weakness.  Endo/Heme/Allergies: Negative.   Psychiatric/Behavioral:  Positive for depression. Negative for hallucinations and suicidal ideas. The patient has insomnia.   All other systems reviewed and are negative.       Past Medical History:  Diagnosis Date   Bronchitis     Diverticulitis 10/2015   Hypertension               Past Surgical History:  Procedure Laterality Date   LUMBAR LAMINECTOMY/DECOMPRESSION MICRODISCECTOMY N/A 12/16/2022    Procedure: THORACIC DECOMPRESSION;  Surgeon: Estill Bamberg, MD;  Location: [facility] OR;  Service: Orthopedics;  Laterality: N/A;        History reviewed. No pertinent family history.     Social History:  reports that he has been smoking cigarettes. He has never used smokeless tobacco. He reports  current alcohol use. He reports that he does not currently use drugs after having used the following drugs: Cocaine. Allergies:  Allergies  No Known Allergies         Medications Prior to Admission  Medication Sig Dispense Refill   acetaminophen (TYLENOL) 650 MG CR tablet Take 1,300 mg by mouth 2 (two) times daily as needed for pain.       cyclobenzaprine (FLEXERIL) 10 MG tablet Take 1 tablet (10 mg total) by mouth 3 (three) times daily as needed for muscle spasms. (Patient taking differently: Take 10 mg by mouth 2  (two) times daily as needed for muscle spasms.) 10 tablet 0   metFORMIN (GLUCOPHAGE-XR) 500 MG 24 hr tablet Take 1 tablet by mouth daily with breakfast.       valsartan (DIOVAN) 80 MG tablet Take 1 tablet by mouth daily.       amLODipine-benazepril (LOTREL) 5-20 MG capsule Take 1 capsule by mouth daily. (Patient not taking: Reported on 12/15/2022) 30 capsule 2   amoxicillin-clavulanate (AUGMENTIN) 875-125 MG tablet Take 1 tablet by mouth every 12 (twelve) hours. (Patient not taking: Reported on 12/15/2022) 14 tablet 0   fenofibrate (TRICOR) 145 MG tablet Take by mouth. (Patient not taking: Reported on 12/15/2022)       lidocaine (LIDODERM) 5 % Place 1 patch onto the skin daily. Remove & Discard patch within 12 hours or as directed by MD (Patient not taking: Reported on 12/15/2022) 30 patch 0   methocarbamol (ROBAXIN) 500 MG tablet Take 1 tablet (500 mg total) by mouth 2 (two) times daily. (Patient not taking: Reported on 12/15/2022) 20 tablet 0          Home: Home Living Family/patient expects to be discharged to:: Private residence Living Arrangements: Spouse/significant other Available Help at Discharge: Family, Available 24 hours/day (at or near 24 hours) Type of Home: Apartment Home Access: Stairs to enter (need clarifiacation, may be a level entry) Entrance Stairs-Number of Steps: 1 Entrance Stairs-Rails: None Home Layout: Two level, 1/2 bath on main level, Bed/bath upstairs Alternate Level Stairs-Number of Steps: Flight  Functional History: Prior Function Prior Level of Function : Independent/Modified Independent ADLs Comments:  (patient reporting since June, his wife has been having to help him with LB ADLs. Prior to this was Independent in ADLs and driving.  Wife took care of IADLs) Functional Status:  Mobility: Bed Mobility Overal bed mobility: Needs Assistance Bed Mobility: Rolling, Sidelying to Sit Rolling: Mod assist Sidelying to sit: Mod assist (near Max) General bed  mobility comments: Mod assist to fully roll into sidelying; very heavy mod assist to elevate trunk to sitting Transfers Overall transfer level: Needs assistance Equipment used: Standard walker Transfers: Sit to/from Stand, Bed to chair/wheelchair/BSC Sit to Stand: Mod assist, +2 physical assistance, +2 safety/equipment General transfer comment: Stood from EOB slightly elevated 2 person Mod assist to power up Ambulation/Gait Ambulation/Gait assistance: Min assist, +2 physical assistance, +2 safety/equipment (chair follow) Gait Distance (Feet): 5 Feet Assistive device: Rolling walker (2 wheels) Gait Pattern/deviations: Decreased step length - right, Decreased step length - left General Gait Details: Short steps with heavy dependence on UE support from RW; Bil LEs showed genu recurvatum in stance, and depending on ligaments/posterior elements of knees for stance stability Gait velocity: slow   ADL: ADL Overall ADL's : Needs assistance/impaired Eating/Feeding: Set up, Sitting Grooming: Wash/dry hands, Wash/dry face, Oral care, Set up, Sitting Upper Body Bathing: Minimal assistance, Sitting Lower Body Bathing: Maximal assistance Upper Body Dressing : Moderate  assistance, Sitting Lower Body Dressing: Total assistance, Sitting/lateral leans Toilet Transfer: Moderate assistance, +2 for physical assistance, +2 for safety/equipment, Stand-pivot, BSC/3in1, Rolling walker (2 wheels) Toileting- Clothing Manipulation and Hygiene: Total assistance Functional mobility during ADLs: Moderate assistance, Rolling walker (2 wheels), Cueing for safety, Cueing for sequencing   Cognition: Cognition Overall Cognitive Status: Within Functional Limits for tasks assessed Orientation Level: Oriented X4 Cognition Arousal: Alert Behavior During Therapy: WFL for tasks assessed/performed Overall Cognitive Status: Within Functional Limits for tasks assessed   Blood pressure 136/80, pulse 77, temperature 98.8 F  (37.1 C), resp. rate 18, height 6\' 1"  (1.854 m), weight 129.3 kg, SpO2 99%. Physical Exam Constitutional:      Appearance: He is obese.     Comments: Pt supine in bed; initially asleep but woke to verbal stimuli; awake, eventually; alert, appropriate, NAD  HENT:     Head: Normocephalic and atraumatic.     Right Ear: External ear normal.     Left Ear: External ear normal.     Nose: Nose normal. No congestion.     Mouth/Throat:     Mouth: Mucous membranes are dry.     Pharynx: Oropharynx is clear. No oropharyngeal exudate.  Eyes:     General:        Right eye: No discharge.        Left eye: No discharge.     Extraocular Movements: Extraocular movements intact.  Cardiovascular:     Rate and Rhythm: Normal rate and regular rhythm.     Heart sounds: Normal heart sounds. No murmur heard.    No gallop.  Pulmonary:     Effort: Pulmonary effort is normal. No respiratory distress.     Breath sounds: Normal breath sounds. No wheezing, rhonchi or rales.  Abdominal:     General: There is distension.     Palpations: Abdomen is soft.     Tenderness: There is no abdominal tenderness.     Comments: Hypoactive BS; protuberant vs distended  Musculoskeletal:     Cervical back: Neck supple. No tenderness.     Comments: Ue's 5/5 B/L RLE- HF 3+/5; KE/KF 4/5; DF 4/5 and PF 4+/5 LLE- HF 4-/5; KE/KF 4+/5 and DF 4+/5 and PF 5-/5   Skin:    General: Skin is warm and dry.     Comments: Drain out Original dressing on thoracic incision  Neurological:     Mental Status: He is alert and oriented to person, place, and time.     Comments: Decreased to light touch and pinprick at T7 B/L- and progressively gets worse down to S2 B/L T6 is intact to pinprick/light touch MAS of 1+ to 2 in LE's No clonus B/L Patella DTRs 2+- not increased nor Achilles either        Lab Results Last 24 Hours  No results found for this or any previous visit (from the past 24 hour(s)).    Imaging Results (Last 48 hours)   DG Lumbar Spine 2-3 Views   Result Date: 12/16/2022 CLINICAL DATA:  Elective surgery. EXAM: LUMBAR SPINE - 2-3 VIEW COMPARISON:  Preoperative MRI FINDINGS: Three portable cross-table lateral views of the thoracic and lumbar spine obtained in the operating room. Image 1 demonstrates surgical instruments posteriorly at the T12 and L1 levels. Image 2 demonstrates surgical instruments posteriorly at the at the T9 and T10 level. Image 3 demonstrates surgical instruments posteriorly at the T8 through T10 levels. IMPRESSION: Intraoperative localization spot views during spinal surgery. Electronically Signed   By:  Narda Rutherford M.D.   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 Yes Co-Morbidities requiring supervision/potential complications: Spasticity, constipation- LBM 4-5 days ago- LE weakness and numbness; cocaine (+), HTN Due to bladder management, bowel management, safety, skin/wound care, disease management, medication administration, pain management, and patient education, does the patient require 24 hr/day rehab nursing? Yes Does the patient require coordinated care of a physician, rehab nurse, therapy disciplines of PT and OT to address physical and functional deficits in the context of the above medical diagnosis(es)? Yes Addressing deficits in the following areas: balance, endurance, locomotion, strength, transferring, bowel/bladder control, bathing, dressing, feeding, grooming, and toileting Can the patient actively participate in an intensive therapy program of at least 3 hrs of therapy per day at least 5 days per week? Yes The potential for patient to make measurable gains while on inpatient rehab is good Anticipated functional outcomes upon  discharge from inpatient rehab are supervision and min assist  with PT, supervision with OT, n/a with SLP. Estimated rehab length of stay to reach the above functional goals is: 2.-3  weeks- Anticipated discharge destination: Home Overall Rehab/Functional Prognosis: good   RECOMMENDATIONS: This patient's condition is appropriate for continued rehabilitative care in the following setting: CIR Patient has agreed to participate in recommended program. Yes Note that insurance prior authorization may be required for reimbursement for recommended care.   Comment:  1 Per chart, patient is on Percocet and Robaxin- HE IS NOT!!!! 2. Pt is on Norco and Flexeril- strongly suggest changing Norco to Percocet for now- with goal to wean while in CIR.  3. Pt could strongly benefit from bladder scans to make sure emptying/doesn't have neurogenic bladder, since had thoracic compression.  4. Suggest Sorbitol 30-60 cc to get pt to have BM 5. Pt NEEDS Baclofen 5 mg TID for spasticity seen on exam- Robaxin and Flexeril not effective for spasticity.  6. Will send in for admission coordinator to see/send in pt for admissions to CIR.  7. Pt needs post op labs to show medical justification for coming to rehab 8. Thank you for consult.      I spent a total of 83   minutes on total care today- >50% coordination of care- due to   Review of chart, orders, vitals and labs; interview and exam of patient- even doing ASIA exam for SCI; as well as documentation of consult.        Mario Rouge, MD 12/18/2022          Routing History

## 2022-12-24 NOTE — Progress Notes (Signed)
Inpatient Rehabilitation Admission Medication Review by a Pharmacist   A complete drug regimen review was completed for this patient to identify any potential clinically significant medication issues.   High Risk Drug Classes Is patient taking? Indication by Medication  Antipsychotic Yes  Compazine- N/V  Anticoagulant No   Antibiotic No    Opioid Yes Percocet- acute post-op pain  Antiplatelet No    Hypoglycemics/insulin Yes Metformin, insulin- T2DM  Vasoactive Medication Yes Avapro- HTN  Chemotherapy No    Other Yes Baclofen- muscle spasms Trazodone- sleep        Type of Medication Issue Identified Description of Issue Recommendation(s)  Drug Interaction(s) (clinically significant)        Duplicate Therapy        Allergy        No Medication Administration End Date        Incorrect Dose        Additional Drug Therapy Needed        Significant med changes from prior encounter (inform family/care partners about these prior to discharge).      Other              Clinically significant medication issues were identified that warrant physician communication and completion of prescribed/recommended actions by midnight of the next day:  No     Time spent performing this drug regimen review (minutes):  30     Latron Ribas BS, PharmD, BCPS Clinical Pharmacist 12/24/2022 13:48 PM   Contact: [phone] after 3 PM   "Be curious, not judgmental..." -Debbora Dus

## 2022-12-24 NOTE — Progress Notes (Signed)
Inpatient Rehabilitation Admissions Coordinator   I have insurance approval and CIR bed to admit him to today. I have alerted acute team and TOC and will make the arrangements to admit.  Ottie Glazier, RN, MSN Rehab Admissions Coordinator [phone] 12/24/2022 8:32 AM

## 2022-12-24 NOTE — H&P (Signed)
Physical Medicine and Rehabilitation Admission H&P    Chief Complaint  Patient presents with   Functional decline due to myelopathy    HPI:  Mario [name] is a 42 year old male with history of HTN, T2DM, diverticulitis, back pain/strain in May who started developing balance issues fall and numbness from waist down. He had seen NS with plans for surgery but was admitted on 12/14/22 with worsening of pain, increase in numbness and increase in BLE weakness w/difficulty walking. He was admitted for work up and evaluated by Dr. 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 UDS positive for opiates and cocaine. He underwent T7-T10 laminectomy with bilateral partial facetectomy and spinal cord decompression on 08/27.   Post op has had some improvement in weakness but continues to have R>LLE weakness and working on standing in [place]. Back brace ordered per patient preference. He continues to be limited by weakness, numbness from waist down as well as LE spasms. He was independent and working till 09/2022 but had steady decline needing cane to walker 3 weeks PTA--CIR recommended due to functional decline.  Feels numbness from abdomen downward.    Review of Systems  Constitutional:  Negative for chills and fever.  HENT:  Negative for hearing loss and tinnitus.   Eyes:  Positive for blurred vision (once or twice -resolved --last a few seconds.). Negative for double vision.  Respiratory:  Negative for cough and shortness of breath.   Cardiovascular:  Negative for chest pain, palpitations and leg swelling.  Gastrointestinal:  Positive for constipation. Negative for heartburn.  Genitourinary:  Negative for dysuria and urgency.  Musculoskeletal:  Positive for joint pain  (right ankle pain) and myalgias.  Skin:  Negative for itching and rash.  Neurological:  Positive for sensory change and weakness. Negative for dizziness and headaches.  Psychiatric/Behavioral:  The patient is not nervous/anxious.      Past Medical History:  Diagnosis Date   Bronchitis    Diabetes (HCC)    Diverticulitis 10/2015   Hypertension     Past Surgical History:  Procedure Laterality Date   LUMBAR LAMINECTOMY/DECOMPRESSION MICRODISCECTOMY N/A 12/16/2022   Procedure: THORACIC DECOMPRESSION;  Surgeon: Estill Bamberg, MD;  Location: [facility] OR;  Service: Orthopedics;  Laterality: N/A;    Family History  Problem Relation Age of Onset   Hyperlipidemia Mother    Diabetes Mother    Heart disease Father      Social History:  Married. Has 3 boys 40, 28 and 41 years old.  Works at [employer] been out of work since 09/29/22. Wife has had stroke but is independent.  He  reports that he has been smoking cigarettes--1/2 to 1/3 pack per day.  He has never used smokeless tobacco. He reports current alcohol use--beers on the weekend.  He reports that he does not currently use drugs after having used the following drugs: Cocaine.   Allergies: No Known Allergies   Medications Prior to Admission  Medication Sig Dispense Refill   acetaminophen (TYLENOL) 650 MG CR tablet Take 1,300 mg by mouth 2 (two) times daily as needed for pain.     baclofen (LIORESAL) 10 MG tablet Take 1 tablet (10 mg total) by mouth 4 (four) times daily as needed for muscle spasms. 30 each 0  fenofibrate (TRICOR) 145 MG tablet Take by mouth. (Patient not taking: Reported on 12/15/2022)     metFORMIN (GLUCOPHAGE-XR) 500 MG 24 hr tablet Take 1 tablet by mouth daily with breakfast.     oxyCODONE-acetaminophen (PERCOCET/ROXICET) 5-325 MG tablet Take 1-2 tablets by mouth every 4 (four) hours as needed for severe pain. 30 tablet 0   valsartan (DIOVAN) 80 MG tablet Take 1 tablet by mouth daily.      Home: Home  Living Family/patient expects to be discharged to:: Private residence Living Arrangements: Spouse/significant other (and teenage children) Available Help at Discharge: Family, Available 24 hours/day (at or near 24 hours) Type of Home: Apartment Home Access: Stairs to enter [employer] of Steps: 1 Entrance Stairs-Rails: None Home Layout: 1/2 bath on main level, Bed/bath upstairs Alternate Level Stairs-Number of Steps: Flight Bathroom Shower/Tub: Engineer, manufacturing systems: Standard Bathroom Accessibility: Yes  Lives With: Spouse (teenage children)   Functional History: Prior Function Prior Level of Function :  (Mod I with RW since May; worked and drove prior to that) ADLs Comments:  (patient reporting since June, his wife has been having to help him with LB ADLs. Prior to this was Independent in ADLs and driving.  Wife took care of IADLs)   Functional Status:  Mobility: Bed Mobility Overal bed mobility: Needs Assistance Bed Mobility: Rolling, Sidelying to Sit Rolling: Min assist Sidelying to sit: Min assist, +2 for safety/equipment Sit to supine: Max assist, Used rails General bed mobility comments: Much improved compared to last session Transfers Overall transfer level: Needs assistance Equipment used: Ambulation equipment used Transfers: Sit to/from Stand, Bed to chair/wheelchair/BSC Sit to Stand: Mod assist (and min assist from high stedy seat flaps) Bed to/from chair/wheelchair/BSC transfer type:: Lateral/scoot transfer  Lateral/Scoot Transfers: Min assist, +2 safety/equipment General transfer comment: performed lateral scoot bed to recliner with min assist (second person steadying recliner chair; stood form recliner to stedy with light mod assist, then performed serial sit<>stands from high stedy seat Ambulation/Gait Ambulation/Gait assistance: Mod assist, Min assist, +2 safety/equipment Gait Distance (Feet): 30 Feet (x2 with seated break between, then  66ft) Assistive device: Rolling walker (2 wheels) (bari width) Gait Pattern/deviations: Decreased step length - right, Decreased step length - left, Step-to pattern, Step-through pattern, Decreased stance time - left, Decreased dorsiflexion - right, Decreased dorsiflexion - left General Gait Details: partial step through to step to gait pattern with decreased stance time on the L compared to the R with heavy UE dependence on RW. Pt appearing to have difficulty with bil dorsiflexion and inconsistent foot placement, pt often with narrow BOS then when cued to widen stance, brings feet wider than shoulder width. Diaphoretic once he reaches ~20-76ft and cold washcloth to forehead. SpO2 98% on RA and HR ~100 bpm with exertion. Did not check standing BP as pt not c/o dizziness. Pt had difficulty managing RW placement needed assist to advance and turn AD during session. Close chair follow for safety. Gait velocity: Significant decrease in cadence. Gait velocity interpretation: <1.31 ft/sec, indicative of household ambulator Pre-gait activities: March in place within stedy   ADL: ADL Overall ADL's : Needs assistance/impaired Eating/Feeding: Set up, Sitting Grooming: Wash/dry face, Sitting, Set up Upper Body Bathing: Minimal assistance, Sitting Lower Body Bathing: Maximal assistance Upper Body Dressing : Moderate assistance, Sitting Lower Body Dressing: Total assistance, Sitting/lateral leans Toilet Transfer: Moderate assistance, +2 for physical assistance, +2 for safety/equipment, Rolling walker (2 wheels) Toilet Transfer Details (indicate cue type and reason): lateral scoot transfer, max A +  2 for standing attempted Toileting- Clothing Manipulation and Hygiene: Maximal assistance, +2 for physical assistance, +2 for safety/equipment, Sit to/from stand Functional mobility during ADLs: Moderate assistance, Maximal assistance, +2 for physical assistance, +2 for safety/equipment General ADL Comments: pain  limited, able to stand this date but unable to progress to stepping. mod A +2 for lateral scoot from bed>chair   Cognition: Cognition Overall Cognitive Status: Within Functional Limits for tasks assessed Orientation Level: Oriented X4 Cognition Arousal: Alert Behavior During Therapy: WFL for tasks assessed/performed Overall Cognitive Status: Within Functional Limits for tasks assessed General Comments: eager to work, able to recall back precautions and brace purpose     Physical Exam: Blood pressure (!) 144/91, pulse 98, temperature 98.1 F (36.7 C), resp. rate 16, height 6\' 1"  (1.854 m), weight 129.3 kg, SpO2 96%. Physical Exam Gen: no distress, normal appearing HEENT: oral mucosa pink and moist, NCAT Cardio: Reg rate Chest: normal effort, normal rate of breathing Abd: soft, non-distended Ext: no edema Psych: pleasant, normal affect Skin: dressing c/d/i Neuro: Alert and oriented x3. Upper extremity strength is intact. LE have 3/5 strength, sensation is decreased from T7 downward  Results for orders placed or performed during the hospital encounter of 12/24/22 (from the past 48 hour(s))  Hemoglobin A1c     Status: Abnormal   Collection Time: 12/24/22  2:01 PM  Result Value Ref Range   Hgb A1c MFr Bld 6.7 (H) 4.8 - 5.6 %    Comment: (NOTE) Pre diabetes:          5.7%-6.4%  Diabetes:              >6.4%  Glycemic control for   <7.0% adults with diabetes    Mean Plasma Glucose 145.59 mg/dL    Comment: Performed at [facility], [address] [place], [place] [address]  Glucose, capillary     Status: Abnormal   Collection Time: 12/24/22  4:40 PM  Result Value Ref Range   Glucose-Capillary 165 (H) 70 - 99 mg/dL    Comment: Glucose reference range applies only to samples taken after fasting for at least 8 hours.   DG Abd 1 View  Result Date: 12/24/2022 CLINICAL DATA:  Constipation EXAM: ABDOMEN - 1 VIEW COMPARISON:  None Available. FINDINGS: Large colonic stool  burden. Nonobstructive bowel gas pattern. Assessment of the sacrum is limited due to overlying bowel gas. No pneumatosis. No supine evidence of pneumoperitoneum. Lung bases are excluded from the field of view. No acute osseous abnormality. Degenerative changes in the lumbar spine. IMPRESSION: Large colonic stool burden Electronically Signed   By: Lorenza Cambridge M.D.   On: 12/24/2022 18:30      Blood pressure (!) 144/91, pulse 98, temperature 98.1 F (36.7 C), resp. rate 16, height 6\' 1"  (1.854 m), weight 129.3 kg, SpO2 96%.  Medical Problem List and Plan: 1. Functional deficits secondary to T7 thoracic spinal cord injury  -patient may shower  -ELOS/Goals: modI 10-14 days  Therapeutic grounds pass prescribed, discussed that grounds pass timeframe is 1 hour and can be used during the times of 10:00am and 6:30pm and should no be used during therapy hours. Advised patient to check out and in at nursing station when using grounds pass  2.  Antithrombotics: -DVT/anticoagulation:  Mechanical: Sequential compression devices, below knee Bilateral lower extremities   3. Pain Management:  N/A 4. Mood/Behavior/Sleep: LCSW to follow for evaluation and support.   -antipsychotic agents: N/A  --trazodone prn for insomnia.  5. Neuropsych/cognition:  This patient is capable of making decisions on his own behalf. 6. Skin/Wound Care: Routine pressure relief measures. 7. Fluids/Electrolytes/Nutrition: Monitor I/O. Check post op labs in am 8. HTN: Monitor BP TID--continue avapro for diovan 9. T2DM: Hgb A1c- Monitor BS ac/hs and use SSI for elevated BS  --continue metformin daily 10. Constipation: Enema administered today on acute as no BM since admission-->KUB showed moderate to large stool burden.   --continue Senna S to 2 tabs bid. Mirlax 34 ounces today.  11. Spasticity: continue baclofen 5mg  QID 12. Worsening sensation since baclofen initiation: decreased baclofen to 5mg  QID  I have personally performed a  face to face diagnostic evaluation, including, but not limited to relevant history and physical exam findings, of this patient and developed relevant assessment and plan.  Additionally, I have reviewed and concur with the physician assistant's documentation above.  Delle Reining, PA-C  Horton Chin, MD 12/24/2022

## 2022-12-24 NOTE — Progress Notes (Signed)
Patient ID: Mario [name], male   DOB: 12-30-1980, 42 y.o.   MRN: [id number] Report called and given to CIR. Patient to be transported to [id number].  Lidia Collum, RN

## 2022-12-24 NOTE — Progress Notes (Signed)
Met with patient and wife at bedside. Reports that A1c was 6.8 in June. Discussed diet and if gets diet and A1c under control that may not have to take metformin.  Gave sheet to track blood pressure at home so that can provide to PCP on follow up.  Takes Valsartan at home for cholesterol. Provided smoking cessation information. Tech to pickup patient for xray.  All needs met.

## 2022-12-24 NOTE — Progress Notes (Signed)
Horton Chin, MD  Physician Physical Medicine and Rehabilitation   PMR Pre-admission    Signed   Date of Service: 12/18/2022  4:48 PM  Related encounter: ED to Hosp-Admission (Discharged) from 12/14/2022 in [facility]   Signed     Expand All Collapse All  Show:Clear all [x] Written[x] Templated[x] Copied  Added by: [x] Standley Brooking, RN  [] Hover for details PMR Admission Coordinator Pre-Admission Assessment   Patient: Mario [name] is an 42 y.o., male MRN: [id number] DOB: Apr 21, 1981 Height: 6\' 1"  (185.4 cm) Weight: 129.3 kg                                                                                                                                      Insurance Information HMO:     PPO: yes     PCP:      IPA:      80/20:      OTHER:  PRIMARY: [employer] [place]      Policy#: [id number]      Subscriber: pt CM Name: Marye Round      Phone#: [phone]     Fax#: [phone] Pre-Cert#: [id number]      approved 12/22/22 until 12/29/22 . I have requested Auth [name] to be adjusted to 9/4 until 9/11 to reflect day admitted Employer:  Benefits:  Phone #: [phone]     Name: 8/29 Eff. Date: 06/19/20     Deduct: $600      Out of Pocket Max: $1800      Life Max: none  CIR: 80%      SNF: 80% 60 [name] Outpatient: $30 per visits     Co-Pay: 60 visits combined Home Health: 80%      Co-Pay: 20% DME: 80%     Co-Pay: 20% Providers: in network  SECONDARY: none      Policy#:       Phone#:    Artist:       Phone#:    The Data processing manager" for patients in Inpatient Rehabilitation Facilities with attached "Privacy Act Statement-Health Care Records" was provided and verbally reviewed with: N/A   Emergency Contact Information Contact Information       Name Relation Home Work Mobile    [place] Spouse [phone]   [phone]         Other Contacts   None on File      Current Medical  History  Patient Admitting Diagnosis: Myelopathy   History of Present Illness: Mario [name] is a 42 y.o. R handed male  with hx of HTN, diverticulosis who had worsening/progressive LE weakness and numbness who saw Ortho Spine- however before surgery was able to be done, he was admitted through ED 12/14/22 for progressive LE weakness to the point he could not walk anymore.    He underwent T7-T10 decompression and lami by Dr Yevette Edwards  and patient reports spasms  and tightness as well as strength are all somewhat better since surgery, however pain is significant and cannot sleep or get comfortable due to pain- he describes as aching and throbbing.    Patient's medical record from [facility] has been reviewed by the rehabilitation admission coordinator and physician.   Past Medical History      Past Medical History:  Diagnosis Date   Bronchitis     Diverticulitis 10/2015   Hypertension          Has the patient had major surgery during 100 [name] prior to admission? Yes   Family History  family history is not on file.   Current Medications   Current Medications    Current Facility-Administered Medications:    0.9 %  sodium chloride infusion, 250 mL, Intravenous, Continuous, McKenzie, Kayla J, PA-C, Stopped at 12/16/22 2026   0.9 % NaCl with KCl 20 mEq/ L  infusion, , Intravenous, Continuous, McKenzie, Kayla J, PA-C   acetaminophen (TYLENOL) tablet 650 mg, 650 mg, Oral, Q4H PRN, 650 mg at 12/21/22 2049 **OR** acetaminophen (TYLENOL) suppository 650 mg, 650 mg, Rectal, Q4H PRN, McKenzie, Kayla J, PA-C   alum & mag hydroxide-simeth (MAALOX/MYLANTA) 200-200-20 MG/5ML suspension 30 mL, 30 mL, Oral, Q6H PRN, McKenzie, Kayla J, PA-C   baclofen (LIORESAL) tablet 10 mg, 10 mg, Oral, QID PRN, McKenzie, Kayla J, PA-C, 10 mg at 12/24/22 0140   bisacodyl (DULCOLAX) EC tablet 5 mg, 5 mg, Oral, Daily PRN, McKenzie, Kayla J, PA-C, 5 mg at 12/23/22 1456   Chlorhexidine Gluconate Cloth 2 % PADS 6  each, 6 each, Topical, Q0600, Estill Bamberg, MD, 6 each at 12/24/22 0753   docusate sodium (COLACE) capsule 100 mg, 100 mg, Oral, BID, McKenzie, Kayla J, PA-C, 100 mg at 12/24/22 0755   fenofibrate tablet 160 mg, 160 mg, Oral, Daily, McKenzie, Kayla J, PA-C, 160 mg at 12/24/22 0756   HYDROcodone-acetaminophen (NORCO/VICODIN) 5-325 MG per tablet 1-2 tablet, 1-2 tablet, Oral, Q4H PRN, McKenzie, Kayla J, PA-C, 2 tablet at 12/23/22 0035   irbesartan (AVAPRO) tablet 75 mg, 75 mg, Oral, Daily, McKenzie, Kayla J, PA-C, 75 mg at 12/24/22 0755   menthol-cetylpyridinium (CEPACOL) lozenge 3 mg, 1 lozenge, Oral, PRN **OR** phenol (CHLORASEPTIC) mouth spray 1 spray, 1 spray, Mouth/Throat, PRN, McKenzie, Kayla J, PA-C   metFORMIN (GLUCOPHAGE-XR) 24 hr tablet 500 mg, 500 mg, Oral, Q breakfast, McKenzie, Kayla J, PA-C, 500 mg at 12/24/22 0754   morphine (PF) 2 MG/ML injection 1-2 mg, 1-2 mg, Intravenous, Q2H PRN, McKenzie, Kayla J, PA-C, 2 mg at 12/20/22 1108   mupirocin ointment (BACTROBAN) 2 %, , Nasal, BID, Estill Bamberg, MD, Given at 12/23/22 2100   ondansetron (ZOFRAN) tablet 4 mg, 4 mg, Oral, Q6H PRN **OR** ondansetron (ZOFRAN) injection 4 mg, 4 mg, Intravenous, Q6H PRN, McKenzie, Kayla J, PA-C   Oral care mouth rinse, 15 mL, Mouth Rinse, PRN, McKenzie, Kayla J, PA-C   oxyCODONE-acetaminophen (PERCOCET/ROXICET) 5-325 MG per tablet 1-2 tablet, 1-2 tablet, Oral, Q4H PRN, McKenzie, Kayla J, PA-C, 2 tablet at 12/24/22 0754   senna-docusate (Senokot-S) tablet 1 tablet, 1 tablet, Oral, QHS PRN, McKenzie, Kayla J, PA-C, 1 tablet at 12/24/22 0755   sodium chloride flush (NS) 0.9 % injection 3 mL, 3 mL, Intravenous, Q12H, McKenzie, Kayla J, PA-C, 3 mL at 12/23/22 0926   sodium chloride flush (NS) 0.9 % injection 3 mL, 3 mL, Intravenous, PRN, McKenzie, Kayla J, PA-C, 3 mL at 12/20/22 2044   sodium phosphate (FLEET) enema 1  enema, 1 enema, Rectal, Once PRN, McKenzie, Kayla J, PA-C   zolpidem (AMBIEN) tablet 5 mg, 5 mg,  Oral, QHS PRN,MR X 1, McKenzie, Kayla J, PA-C, 5 mg at 12/23/22 2100     Patients Current Diet:  Diet Order                  Diet regular Room service appropriate? Yes; Fluid consistency: Thin  Diet effective now                       Precautions / Restrictions Precautions Precautions: Back, Fall, Other (comment) Precaution Comments: reviewed back precautions Spinal Brace: Thoracolumbosacral orthotic, Applied in sitting position ("no brace needed" in order set, but pt prefers to wear brace) Restrictions Weight Bearing Restrictions: No    Has the patient had 2 or more falls or a fall with injury in the past year?Yes Falls daily since May and now falling out of bed at home   Prior Activity Level Limited Community (1-2x/wk): using walker since May; daily falls and recent falls from bed   Prior Functional Level Prior Function Prior Level of Function :  (Mod I with RW since May; worked and drove prior to that) ADLs Comments:  (patient reporting since June, his wife has been having to help him with LB ADLs. Prior to this was Independent in ADLs and driving.  Wife took care of IADLs)   Self Care: Did the patient need help bathing, dressing, using the toilet or eating?  Needed some help   Indoor Mobility: Did the patient need assistance with walking from room to room (with or without device)? Needed some help   Stairs: Did the patient need assistance with internal or external stairs (with or without device)? Needed some help   Functional Cognition: Did the patient need help planning regular tasks such as shopping or remembering to take medications? Independent   Patient Information Are you of Hispanic, Latino/a,or Spanish origin?: A. No, not of Hispanic, Latino/a, or Spanish origin What is your race?: B. Black or African American Do you need or want an interpreter to communicate with a doctor or health care staff?: 0. No   Patient's Response To:  Health Literacy and  Transportation Is the patient able to respond to health literacy and transportation needs?: Yes Health Literacy - How often do you need to have someone help you when you read instructions, pamphlets, or other written material from your doctor or pharmacy?: Never In the past 12 months, has lack of transportation kept you from medical appointments or from getting medications?: No In the past 12 months, has lack of transportation kept you from meetings, work, or from getting things needed for daily living?: No   Journalist, newspaper / Equipment Home Assistive Devices/Equipment: None   Prior Device Use: Indicate devices/aids used by the patient prior to current illness, exacerbation or injury? Walker   Current Functional Level Cognition   Overall Cognitive Status: Within Functional Limits for tasks assessed Orientation Level: Oriented X4 General Comments: eager to work, able to recall back precautions and brace purpose    Extremity Assessment (includes Sensation/Coordination)   Upper Extremity Assessment: Overall WFL for tasks assessed  Lower Extremity Assessment: Defer to PT evaluation RLE Deficits / Details: noted to have no dorsiflexion with testing. Pt with transfer keeping the R LE off the ground and reports "its spasming" pt with decreased coordination of the R LE LLE Deficits / Details: Grossly 3/5 throughout; noteworthy for quad  weakness evidenced by tendency for genu recurvatum in stance, and depending on ligaments/posterior elements of knees for stance stability; difficulty with mini-squats with UE support on RW     ADLs   Overall ADL's : Needs assistance/impaired Eating/Feeding: Set up, Sitting Grooming: Wash/dry face, Sitting, Set up Upper Body Bathing: Minimal assistance, Sitting Lower Body Bathing: Maximal assistance Upper Body Dressing : Moderate assistance, Sitting Lower Body Dressing: Total assistance, Sitting/lateral leans Toilet Transfer: Moderate assistance, +2 for  physical assistance, +2 for safety/equipment, Rolling walker (2 wheels) Toilet Transfer Details (indicate cue type and reason): lateral scoot transfer, max A +2 for standing attempted Toileting- Clothing Manipulation and Hygiene: Maximal assistance, +2 for physical assistance, +2 for safety/equipment, Sit to/from stand Functional mobility during ADLs: Moderate assistance, Maximal assistance, +2 for physical assistance, +2 for safety/equipment General ADL Comments: pain limited, able to stand this date but unable to progress to stepping. mod A +2 for lateral scoot from bed>chair     Mobility   Overal bed mobility: Needs Assistance Bed Mobility: Rolling, Sidelying to Sit Rolling: Min assist Sidelying to sit: Min assist, +2 for safety/equipment Sit to supine: Max assist, Used rails General bed mobility comments: Much improved compared to last session     Transfers   Overall transfer level: Needs assistance Equipment used: Ambulation equipment used Transfers: Sit to/from Stand, Bed to chair/wheelchair/BSC Sit to Stand: Mod assist (and min assist from high stedy seat flaps) Bed to/from chair/wheelchair/BSC transfer type:: Lateral/scoot transfer  Lateral/Scoot Transfers: Min assist, +2 safety/equipment General transfer comment: performed lateral scoot bed to recliner with min assist (second person steadying recliner chair; stood form recliner to stedy with light mod assist, then performed serial sit<>stands from high stedy seat     Ambulation / Gait / Stairs / Wheelchair Mobility   Ambulation/Gait Ambulation/Gait assistance: Mod assist, Min assist, +2 safety/equipment Gait Distance (Feet): 30 Feet (x2 with seated break between, then 51ft) Assistive device: Rolling walker (2 wheels) (bari width) Gait Pattern/deviations: Decreased step length - right, Decreased step length - left, Step-to pattern, Step-through pattern, Decreased stance time - left, Decreased dorsiflexion - right, Decreased  dorsiflexion - left General Gait Details: partial step through to step to gait pattern with decreased stance time on the L compared to the R with heavy UE dependence on RW. Pt appearing to have difficulty with bil dorsiflexion and inconsistent foot placement, pt often with narrow BOS then when cued to widen stance, brings feet wider than shoulder width. Diaphoretic once he reaches ~20-51ft and cold washcloth to forehead. SpO2 98% on RA and HR ~100 bpm with exertion. Did not check standing BP as pt not c/o dizziness. Pt had difficulty managing RW placement needed assist to advance and turn AD during session. Close chair follow for safety. Gait velocity: Significant decrease in cadence. Gait velocity interpretation: <1.31 ft/sec, indicative of household ambulator Pre-gait activities: March in place within [employer] / Balance Dynamic Sitting Balance Sitting balance - Comments: No overt LOB. Pt reports back pain is better today Balance Overall balance assessment: Needs assistance Sitting-balance support: Feet supported Sitting balance-Leahy Scale: Fair Sitting balance - Comments: No overt LOB. Pt reports back pain is better today Standing balance support: Bilateral upper extremity supported, During functional activity, Reliant on assistive device for balance Standing balance-Leahy Scale: Poor Standing balance comment: heavy dependence on UEs     Special needs/care consideration Fall precautions    Previous Home Environment  Living Arrangements: Spouse/significant other (and teenage children)  Lives  With: Spouse (teenage children) Available Help at Discharge: Family, Available 24 hours/day (at or near 24 hours) Type of Home: Apartment Home Layout: 1/2 bath on main level, Bed/bath upstairs Alternate Level Stairs-Number of Steps: Flight Home Access: Stairs to enter Entrance Stairs-Rails: None Secretary/administrator of Steps: 1 Bathroom Shower/Tub: Museum/gallery conservator: Yes How Accessible: Accessible via walker Home Care Services: No   Discharge Living Setting Plans for Discharge Living Setting: Patient's home, Apartment, Lives with (comment) (spouse and teenage children) Type of Home at Discharge: Apartment Discharge Home Layout: Two level, 1/2 bath on main level, Bed/bath upstairs Alternate Level Stairs-Number of Steps: flight Discharge Home Access: Stairs to enter Entrance Stairs-Rails: None Entrance Stairs-Number of Steps: 1 Discharge Bathroom Shower/Tub: Tub/shower unit Discharge Bathroom Toilet: Standard Discharge Bathroom Accessibility: Yes How Accessible: Accessible via walker Does the patient have any problems obtaining your medications?: No   Social/Family/Support Systems Patient Roles: Spouse, Parent Contact Information: wife Anticipated Caregiver: wife Anticipated Caregiver's Contact Information: see contacts Ability/Limitations of Caregiver: none Caregiver Availability: 24/7 Discharge Plan Discussed with Primary Caregiver: Yes Is Caregiver In Agreement with Plan?: Yes Does Caregiver/Family have Issues with Lodging/Transportation while Pt is in Rehab?: No   Goals Patient/Family Goal for Rehab: Mod I to supervision with PT and OT Expected length of stay: ELOS 10 to 14 [name] Pt/Family Agrees to Admission and willing to participate: Yes Program Orientation Provided & Reviewed with Pt/Caregiver Including Roles  & Responsibilities: Yes   Decrease burden of Care through IP rehab admission: n/a   Possible need for SNF placement upon discharge:not anticipated   Patient Condition: This patient's medical and functional status has changed since the consult dated: 12/18/22 in which the Rehabilitation Physician determined and documented that the patient's condition is appropriate for intensive rehabilitative care in an inpatient rehabilitation facility. See "History of Present Illness" (above) for medical update.  Functional changes are: mod assist overall. Patient's medical and functional status update has been discussed with the Rehabilitation physician and patient remains appropriate for inpatient rehabilitation. Will admit to inpatient rehab today.   Preadmission Screen Completed By:  Clois Dupes, RN MSN 12/24/2022 9:56 AM ______________________________________________________________________   Discussed status with Dr. Carlis Abbott on 12/24/22 at 0957 and received approval for admission today.   Admission Coordinator:  Clois Dupes RN MSN, time 9811 Date 12/24/22            Revision History

## 2022-12-24 NOTE — Progress Notes (Signed)
Patient evaluated at his bedside this morning.  He feels he is continuing to make progress, but very slow progress.  He is not able to return home at this point.  It appears as though insurance has finally approved him transferring to rehab, and that this is due to occur today.  His examination is unchanged.  He will be transferred to rehab today.  My office will reach out to him regarding a follow-up appointment.

## 2022-12-24 NOTE — Progress Notes (Signed)
INPATIENT REHABILITATION ADMISSION NOTE   Arrival Method:Via wheelchair from 5N      Mental Orientation: A/ox4    Assessment: completed    Skin: surgical incision on back    IV'S: left forarm   Pain: 7/10, pain meds given    Tubes and Drains: None   Safety Measures: call bell within reach , 3 side rails up.    Vital Signs: Charted   Height and Weight: Charted    Rehab Orientation: patient and spouse aware of rehab orientation and do not have questions at this time .    Family: wife at bedside    Tori Milks LPN

## 2022-12-24 NOTE — Progress Notes (Signed)
Patient request to remove the SCD's because it makes his "legs feel funny,like he is paralysis when they are on". Explain and educated on the purposes of wearing the SCD's, states he is aware but prefers not to wear them tonight, Informed to speak with his medical team in the morning . SCD/s removed .

## 2022-12-24 NOTE — Plan of Care (Signed)
Patient ID: Mario [name], male   DOB: September 19, 1980, 42 y.o.   MRN: [id number]  Problem: Education: Goal: Knowledge of General Education information will improve Description: Including pain rating scale, medication(s)/side effects and non-pharmacologic comfort measures Outcome: Adequate for Discharge   Problem: Health Behavior/Discharge Planning: Goal: Ability to manage health-related needs will improve Outcome: Adequate for Discharge   Problem: Clinical Measurements: Goal: Ability to maintain clinical measurements within normal limits will improve Outcome: Adequate for Discharge Goal: Will remain free from infection Outcome: Adequate for Discharge Goal: Diagnostic test results will improve Outcome: Adequate for Discharge Goal: Respiratory complications will improve Outcome: Adequate for Discharge Goal: Cardiovascular complication will be avoided Outcome: Adequate for Discharge   Problem: Activity: Goal: Risk for activity intolerance will decrease Outcome: Adequate for Discharge   Problem: Nutrition: Goal: Adequate nutrition will be maintained Outcome: Adequate for Discharge   Problem: Coping: Goal: Level of anxiety will decrease Outcome: Adequate for Discharge   Problem: Elimination: Goal: Will not experience complications related to bowel motility Outcome: Adequate for Discharge Goal: Will not experience complications related to urinary retention Outcome: Adequate for Discharge   Problem: Pain Managment: Goal: General experience of comfort will improve Outcome: Adequate for Discharge   Problem: Safety: Goal: Ability to remain free from injury will improve Outcome: Adequate for Discharge   Problem: Skin Integrity: Goal: Risk for impaired skin integrity will decrease Outcome: Adequate for Discharge   Problem: Education: Goal: Ability to verbalize activity precautions or restrictions will improve Outcome: Adequate for Discharge Goal: Knowledge of the prescribed  therapeutic regimen will improve Outcome: Adequate for Discharge Goal: Understanding of discharge needs will improve Outcome: Adequate for Discharge   Problem: Activity: Goal: Ability to avoid complications of mobility impairment will improve Outcome: Adequate for Discharge Goal: Ability to tolerate increased activity will improve Outcome: Adequate for Discharge Goal: Will remain free from falls Outcome: Adequate for Discharge   Problem: Bowel/Gastric: Goal: Gastrointestinal status for postoperative course will improve Outcome: Adequate for Discharge   Problem: Clinical Measurements: Goal: Ability to maintain clinical measurements within normal limits will improve Outcome: Adequate for Discharge Goal: Postoperative complications will be avoided or minimized Outcome: Adequate for Discharge Goal: Diagnostic test results will improve Outcome: Adequate for Discharge   Problem: Pain Management: Goal: Pain level will decrease Outcome: Adequate for Discharge   Problem: Skin Integrity: Goal: Will show signs of wound healing Outcome: Adequate for Discharge   Problem: Health Behavior/Discharge Planning: Goal: Identification of resources available to assist in meeting health care needs will improve Outcome: Adequate for Discharge   Problem: Bladder/Genitourinary: Goal: Urinary functional status for postoperative course will improve Outcome: Adequate for Discharge   Problem: Acute Rehab PT Goals(only PT should resolve) Goal: Pt will Roll Supine to Side Outcome: Adequate for Discharge Goal: Pt Will Go Supine/Side To Sit Outcome: Adequate for Discharge Goal: Pt Will Go Sit To Supine/Side Outcome: Adequate for Discharge Goal: Patient Will Transfer Sit To/From Stand Outcome: Adequate for Discharge Goal: Pt Will Ambulate Outcome: Adequate for Discharge Goal: Pt Will Go Up/Down Stairs Outcome: Adequate for Discharge   Problem: Acute Rehab OT Goals (only OT should resolve) Goal:  Pt. Will Perform Lower Body Bathing Outcome: Adequate for Discharge Goal: Pt. Will Transfer To Toilet Outcome: Adequate for Discharge Goal: Pt. Will Perform Toileting-Clothing Manipulation Outcome: Adequate for Discharge Goal: Pt. Will Perform Tub/Shower Transfer Outcome: Adequate for Discharge    Lidia Collum, RN

## 2022-12-25 ENCOUNTER — Inpatient Hospital Stay (HOSPITAL_COMMUNITY): Payer: BC Managed Care – PPO

## 2022-12-25 DIAGNOSIS — R609 Edema, unspecified: Secondary | ICD-10-CM

## 2022-12-25 LAB — GLUCOSE, CAPILLARY
Glucose-Capillary: 146 mg/dL — ABNORMAL HIGH (ref 70–99)
Glucose-Capillary: 113 mg/dL — ABNORMAL HIGH (ref 70–99)
Glucose-Capillary: 102 mg/dL — ABNORMAL HIGH (ref 70–99)
Glucose-Capillary: 150 mg/dL — ABNORMAL HIGH (ref 70–99)

## 2022-12-25 LAB — COMPREHENSIVE METABOLIC PANEL
ALT: 27 U/L (ref 0–44)
AST: 19 U/L (ref 15–41)
Albumin: 3.4 g/dL — ABNORMAL LOW (ref 3.5–5.0)
Alkaline Phosphatase: 70 U/L (ref 38–126)
Anion gap: 12 (ref 5–15)
BUN: 14 mg/dL (ref 6–20)
CO2: 27 mmol/L (ref 22–32)
Calcium: 10.3 mg/dL (ref 8.9–10.3)
Chloride: 93 mmol/L — ABNORMAL LOW (ref 98–111)
Creatinine, Ser: 1.01 mg/dL (ref 0.61–1.24)
GFR, Estimated: >60 mL/min (ref >60)
Glucose, Bld: 137 mg/dL — ABNORMAL HIGH (ref 70–99)
Potassium: 4.4 mmol/L (ref 3.5–5.1)
Sodium: 132 mmol/L — ABNORMAL LOW (ref 135–145)
Total Bilirubin: 0.7 mg/dL (ref 0.3–1.2)
Total Protein: 7.9 g/dL (ref 6.5–8.1)

## 2022-12-25 LAB — CBC WITH DIFFERENTIAL/PLATELET
Abs Immature Granulocytes: 0.08 10*3/uL — ABNORMAL HIGH (ref 0.00–0.07)
Basophils Absolute: 0.1 10*3/uL (ref 0.0–0.1)
Basophils Relative: 1 %
Eosinophils Absolute: 0.2 10*3/uL (ref 0.0–0.5)
Eosinophils Relative: 2 %
HCT: 46.3 % (ref 39.0–52.0)
Hemoglobin: 15.5 g/dL (ref 13.0–17.0)
Immature Granulocytes: 1 %
Lymphocytes Relative: 31 %
Lymphs Abs: 3.4 10*3/uL (ref 0.7–4.0)
MCH: 30.3 pg (ref 26.0–34.0)
MCHC: 33.5 g/dL (ref 30.0–36.0)
MCV: 90.4 fL (ref 80.0–100.0)
Monocytes Absolute: 0.9 10*3/uL (ref 0.1–1.0)
Monocytes Relative: 9 %
Neutro Abs: 6.3 10*3/uL (ref 1.7–7.7)
Neutrophils Relative %: 56 %
Platelets: 517 10*3/uL — ABNORMAL HIGH (ref 150–400)
RBC: 5.12 MIL/uL (ref 4.22–5.81)
RDW: 13.1 % (ref 11.5–15.5)
WBC: 11 10*3/uL — ABNORMAL HIGH (ref 4.0–10.5)
nRBC: 0 % (ref 0.0–0.2)

## 2022-12-25 NOTE — Progress Notes (Signed)
Inpatient Rehabilitation  Patient information reviewed and entered into eRehab system by Melissa M. Bowie, M.A., CCC/SLP, PPS Coordinator.  Information including medical coding, functional ability and quality indicators will be reviewed and updated through discharge.    

## 2022-12-25 NOTE — Discharge Summary (Signed)
Patient ID: Mario [name] MRN: [id number] DOB/AGE: 1980/06/12 42 y.o.  Admit date: 12/14/2022 Discharge date: 12/24/2022  Admission Diagnoses:  Principal Problem:   Myelopathy [facility])   Discharge Diagnoses:  Same  Past Medical History:  Diagnosis Date   Bronchitis    Diabetes (HCC)    Diverticulitis 10/2015   Hypertension     Surgeries: Procedure(s): THORACIC DECOMPRESSION on 12/16/2022   Consultants: None  Discharged Condition: Improved  Hospital Course: Mario [name] is an 42 y.o. male who was admitted 12/14/2022 for operative treatment of Myelopathy (HCC). Patient has severe unremitting pain that affects sleep, daily activities, and work/hobbies. After pre-op clearance the patient was taken to the operating room on 12/16/2022 and underwent  Procedure(s): THORACIC DECOMPRESSION.    Patient was given perioperative antibiotics:  Anti-infectives (From admission, onward)    Start     Dose/Rate Route Frequency Ordered Stop   12/16/22 2200  ceFAZolin (ANCEF) IVPB 2g/100 mL premix        2 g 200 mL/hr over 30 Minutes Intravenous Every 8 hours 12/16/22 1907 12/17/22 0455        Patient was given sequential compression devices, early ambulation to prevent DVT.  Patient benefited maximally from hospital stay and there were no complications.    Recent vital signs: BP 119/73 (BP Location: Left Arm)   Pulse 88   Temp 97.8 F (36.6 C)   Resp 18   Ht 6\' 1"  (1.854 m)   Wt 129.3 kg   SpO2 96%   BMI 37.60 kg/m    Discharge Medications:   Allergies as of 12/24/2022   No Known Allergies      Medication List     STOP taking these medications    amLODipine-benazepril 5-20 MG capsule Commonly known as: LOTREL   amoxicillin-clavulanate 875-125 MG tablet Commonly known as: AUGMENTIN   cyclobenzaprine 10 MG tablet Commonly known as: FLEXERIL   lidocaine 5 % Commonly known as: Lidoderm   methocarbamol 500 MG tablet Commonly known as: ROBAXIN       TAKE  these medications    acetaminophen 650 MG CR tablet Commonly known as: TYLENOL Take 1,300 mg by mouth 2 (two) times daily as needed for pain.   baclofen 10 MG tablet Commonly known as: LIORESAL Take 1 tablet (10 mg total) by mouth 4 (four) times daily as needed for muscle spasms.   fenofibrate 145 MG tablet Commonly known as: TRICOR Take by mouth.   metFORMIN 500 MG 24 hr tablet Commonly known as: GLUCOPHAGE-XR Take 1 tablet by mouth daily with breakfast.   oxyCODONE-acetaminophen 5-325 MG tablet Commonly known as: PERCOCET/ROXICET Take 1-2 tablets by mouth every 4 (four) hours as needed for severe pain.   valsartan 80 MG tablet Commonly known as: DIOVAN Take 1 tablet by mouth daily.        Diagnostic Studies: DG Abd 1 View  Result Date: 12/24/2022 CLINICAL DATA:  Constipation EXAM: ABDOMEN - 1 VIEW COMPARISON:  None Available. FINDINGS: Large colonic stool burden. Nonobstructive bowel gas pattern. Assessment of the sacrum is limited due to overlying bowel gas. No pneumatosis. No supine evidence of pneumoperitoneum. Lung bases are excluded from the field of view. No acute osseous abnormality. Degenerative changes in the lumbar spine. IMPRESSION: Large colonic stool burden Electronically Signed   By: Lorenza Cambridge M.D.   On: 12/24/2022 18:30   VAS [place] LOWER EXTREMITY VENOUS (DVT)  Result Date: 12/19/2022  Lower Venous DVT Study Patient Name:  Mario [name]  Date of  Exam:   12/19/2022 Medical Rec #: [id number]        Accession #:    [id number] Date of Birth: 10/06/1980        Patient Gender: M Patient Age:   62 years Exam Location:  [facility] Procedure:      VAS [place] LOWER EXTREMITY VENOUS (DVT) Referring Phys: Jason Coop --------------------------------------------------------------------------------  Indications: Right calf pain, post-op lumbar fusion surgery.  Comparison Study: No prior studies of right lower extremity. Performing Technologist: Jean Rosenthal RDMS, RVT   Examination Guidelines: A complete evaluation includes B-mode imaging, spectral Doppler, color Doppler, and power Doppler as needed of all accessible portions of each vessel. Bilateral testing is considered an integral part of a complete examination. Limited examinations for reoccurring indications may be performed as noted. The reflux portion of the exam is performed with the patient in reverse Trendelenburg.  +---------+---------------+---------+-----------+----------+--------------+ RIGHT    CompressibilityPhasicitySpontaneityPropertiesThrombus Aging +---------+---------------+---------+-----------+----------+--------------+ CFV      Full           Yes      Yes                                 +---------+---------------+---------+-----------+----------+--------------+ SFJ      Full                                                        +---------+---------------+---------+-----------+----------+--------------+ FV Prox  Full                                                        +---------+---------------+---------+-----------+----------+--------------+ FV Mid   Full                                                        +---------+---------------+---------+-----------+----------+--------------+ FV DistalFull                                                        +---------+---------------+---------+-----------+----------+--------------+ PFV      Full                                                        +---------+---------------+---------+-----------+----------+--------------+ POP      Full           Yes      Yes                                 +---------+---------------+---------+-----------+----------+--------------+ PTV      Full                                                        +---------+---------------+---------+-----------+----------+--------------+  PERO     Full                                                         +---------+---------------+---------+-----------+----------+--------------+ Gastroc  Full                                                        +---------+---------------+---------+-----------+----------+--------------+   +----+---------------+---------+-----------+----------+--------------+ LEFTCompressibilityPhasicitySpontaneityPropertiesThrombus Aging +----+---------------+---------+-----------+----------+--------------+ CFV Full           Yes      Yes                                 +----+---------------+---------+-----------+----------+--------------+     Summary: RIGHT: - There is no evidence of deep vein thrombosis in the lower extremity.  - No cystic structure found in the popliteal fossa.  LEFT: - No evidence of common femoral vein obstruction.   *See table(s) above for measurements and observations. Electronically signed by Lemar Livings MD on 12/19/2022 at 7:10:43 PM.    Final    DG Lumbar Spine 2-3 Views  Result Date: 12/16/2022 CLINICAL DATA:  Elective surgery. EXAM: LUMBAR SPINE - 2-3 VIEW COMPARISON:  Preoperative MRI FINDINGS: Three portable cross-table lateral views of the thoracic and lumbar spine obtained in the operating room. Image 1 demonstrates surgical instruments posteriorly at the T12 and L1 levels. Image 2 demonstrates surgical instruments posteriorly at the at the T9 and T10 level. Image 3 demonstrates surgical instruments posteriorly at the T8 through T10 levels. IMPRESSION: Intraoperative localization spot views during spinal surgery. Electronically Signed   By: Narda Rutherford M.D.   On: 12/16/2022 18:48   MR Lumbar Spine Wo Contrast  Result Date: 12/15/2022 CLINICAL DATA:  Low back pain, cauda equina syndrome suspected EXAM: MRI LUMBAR SPINE WITHOUT CONTRAST TECHNIQUE: Multiplanar, multisequence MR imaging of the lumbar spine was performed. No intravenous contrast was administered. COMPARISON:  MRI 10/06/2022 FINDINGS: Segmentation:  5 lumbar type  vertebral bodies. Alignment:  No listhesis.  Mild dextrocurvature. Vertebrae: No acute fracture, suspicious osseous lesion, or evidence of discitis. Congenitally short pedicles, which narrow the AP diameter of the spinal canal. Conus medullaris and cauda equina: Conus extends to the L1-L2 level. Conus and cauda equina appear normal. Paraspinal and other soft tissues: Negative. Disc levels: T12-L1: No significant disc bulge. No spinal canal stenosis or neural foraminal narrowing. L1-L2: No significant disc bulge. Mild facet arthropathy. No spinal canal stenosis or neural foraminal narrowing. L2-L3: No significant disc bulge. Moderate facet arthropathy. No spinal canal stenosis or neural foraminal narrowing. L3-L4: No significant disc bulge. Moderate facet arthropathy. No spinal canal stenosis. Mild right neural foraminal narrowing. L4-L5: Minimal disc bulge. Severe facet arthropathy. No spinal canal stenosis. Mild bilateral neural foraminal narrowing. L5-S1: No significant disc bulge. Severe left and moderate right facet arthropathy. Right foraminal annular fissure, which can irritate adjacent nerve root. No spinal canal stenosis or neural foraminal narrowing. Again noted are conjoined nerve roots exiting the right neural foramen. IMPRESSION: 1. No spinal canal stenosis. 2. Mild neural foraminal narrowing on the right at L3-L4 and bilaterally at L4-L5. 3. Multilevel facet  arthropathy, which is severe at L4-L5 and on the left at L5-S1, which can be a cause of back pain. Electronically Signed   By: Wiliam Ke M.D.   On: 12/15/2022 01:14   MR THORACIC SPINE WO CONTRAST  Result Date: 12/15/2022 CLINICAL DATA:  Falls, numbness from the waist down EXAM: MRI THORACIC SPINE WITHOUT CONTRAST TECHNIQUE: Multiplanar, multisequence MR imaging of the thoracic spine was performed. No intravenous contrast was administered. COMPARISON:  10/06/2022 MRI thoracic spine and 10/27/2022 CT thoracic spine FINDINGS: Alignment:  Preservation of the normal thoracic kyphosis. No listhesis. Vertebrae: Redemonstrated increased STIR signal in the bilateral pedicles of T8 and T9. There is now increased T2 signal more anteriorly in the T9 vertebral body (series 4, image 7), with approximately 30% vertebral body height loss anteriorly, eccentric to the left, favored to represent a subacute fracture. Additional increased T2 signal is now seen at the anterior aspect of T11, with right eccentric vertebral body height loss of approximately 30% (series 3, image 13), which could also represent a subacute fracture. Incidental note is made of partial cervical ribs at C7. Cord:  Normal signal and morphology. Paraspinal and other soft tissues: Negative. Disc levels: Diffuse epidural lipomatosis, which limits the space around the thecal sac at T6-T7, T7-T8, T8-T9, T9-T10, and T10-T11. In addition, at T7-T8 through T10-T11, there is severe facet arthropathy, as seen on the prior exam, which causes severe spinal canal stenosis at T6-T7, T7-T8, and T9-T10, similar to the prior exams. IMPRESSION: 1. Increased T2 signal in the T9 and T11 vertebral bodies, with approximately 30% vertebral body height loss anteriorly, favored to represent subacute fractures. 2. Unchanged increased STIR signal in the bilateral pedicles of T8 and T9, which may represent stress reaction. 3. 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 These results were called by telephone at the time of interpretation on 12/15/2022 at 1:07 am to provider [facility], who verbally acknowledged these results. Electronically Signed   By: Wiliam Ke M.D.   On: 12/15/2022 01:07    Disposition: Discharge disposition: 70-Another [facility] Care [facility] Not Defined       Discharge Instructions     Discharge patient   Complete by: As directed    Transfer to CIR   Discharge disposition: 70-Another Health Care Institution Not Defined   Discharge patient date: 12/24/2022        -Scripts for pain sent to pharmacy electronically  -D/C instructions sheet printed and in chart -D/C today to CIR -F/U in office 2 weeks PO  Signed: Eilene Ghazi Cas Tracz 12/25/2022, 11:01 AM

## 2022-12-25 NOTE — Plan of Care (Signed)
  Problem: RH Grooming Goal: LTG Patient will perform grooming w/assist,cues/equip (OT) Description: LTG: Patient will perform grooming with assist, with/without cues using equipment (OT) Flowsheets (Taken 12/25/2022 1238) LTG: Pt will perform grooming with assistance level of: Independent with assistive device    Problem: RH Bathing Goal: LTG Patient will bathe all body parts with assist levels (OT) Description: LTG: Patient will bathe all body parts with assist levels (OT) Flowsheets (Taken 12/25/2022 1238) LTG: Pt will perform bathing with assistance level/cueing: Supervision/Verbal cueing   Problem: RH Dressing Goal: LTG Patient will perform upper body dressing (OT) Description: LTG Patient will perform upper body dressing with assist, with/without cues (OT). Flowsheets (Taken 12/25/2022 1238) LTG: Pt will perform upper body dressing with assistance level of: Independent with assistive device Goal: LTG Patient will perform lower body dressing w/assist (OT) Description: LTG: Patient will perform lower body dressing with assist, with/without cues in positioning using equipment (OT) Flowsheets (Taken 12/25/2022 1238) LTG: Pt will perform lower body dressing with assistance level of: Supervision/Verbal cueing   Problem: RH Toileting Goal: LTG Patient will perform toileting task (3/3 steps) with assistance level (OT) Description: LTG: Patient will perform toileting task (3/3 steps) with assistance level (OT)  Flowsheets (Taken 12/25/2022 1238) LTG: Pt will perform toileting task (3/3 steps) with assistance level: Supervision/Verbal cueing   Problem: RH Toilet Transfers Goal: LTG Patient will perform toilet transfers w/assist (OT) Description: LTG: Patient will perform toilet transfers with assist, with/without cues using equipment (OT) Flowsheets (Taken 12/25/2022 1238) LTG: Pt will perform toilet transfers with assistance level of: Supervision/Verbal cueing   Problem: RH Tub/Shower  Transfers Goal: LTG Patient will perform tub/shower transfers w/assist (OT) Description: LTG: Patient will perform tub/shower transfers with assist, with/without cues using equipment (OT) Flowsheets (Taken 12/25/2022 1238) LTG: Pt will perform tub/shower stall transfers with assistance level of: Supervision/Verbal cueing

## 2022-12-25 NOTE — Progress Notes (Signed)
BLE venous duplex has been completed.   Results can be found under chart review under CV PROC. 12/25/2022 5:34 PM Sharai Overbay RVT, RDMS

## 2022-12-25 NOTE — Evaluation (Signed)
Occupational Therapy Assessment and Plan  Patient Details  Name: Mario [name] MRN: [id number] Date of Birth: [birth year]  OT Diagnosis: lumbago (low back pain) and muscle weakness (generalized) Rehab Potential: Rehab Potential (ACUTE ONLY): Good ELOS: 14-16 days   Today's Date: 12/25/2022 OT Individual Time: 1100-1200 OT Individual Time Calculation (min): 60 min     Hospital Problem: Active Problems:   * No active hospital problems. *   Past Medical History:  Past Medical History:  Diagnosis Date   Bronchitis    Diabetes (HCC)    Diverticulitis 10/2015   Hypertension    Past Surgical History:  Past Surgical History:  Procedure Laterality Date   LUMBAR LAMINECTOMY/DECOMPRESSION MICRODISCECTOMY N/A 12/16/2022   Procedure: THORACIC DECOMPRESSION;  Surgeon: Estill Bamberg, MD;  Location: [facility] OR;  Service: Orthopedics;  Laterality: N/A;    Assessment & Plan Clinical Impression: Mario [name] is a 42 year old male with history of HTN, T2DM, diverticulitis, back pain/strain in May who started developing balance issues fall and numbness from waist down. He had seen NS with plans for surgery but was admitted on 12/14/22 with worsening of pain, increase in numbness and increase in BLE weakness w/difficulty walking. He was admitted for work up and evaluated by Dr. 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 UDS positive for opiates and cocaine. He underwent T7-T10 laminectomy with bilateral partial facetectomy and spinal cord decompression on 08/27.    Post op has had some improvement in weakness but continues to have R>LLE weakness and working on standing in [place]. Back brace ordered per patient preference. He continues to be limited by weakness, numbness from  waist down as well as LE spasms. He was independent and working till 09/2022 but had steady decline needing cane to walker 3 weeks PTA--CIR recommended due to functional decline.  Feels numbness from abdomen downward.  Patient transferred to CIR on 12/24/2022 .    Patient currently requires max with basic self-care skills secondary to muscle weakness, decreased cardiorespiratoy endurance, unbalanced muscle activation, and decreased sitting balance, decreased standing balance, and decreased balance strategies.  Prior to hospitalization, patient could complete ADLs with independent .  Patient will benefit from skilled intervention to decrease level of assist with basic self-care skills and increase independence with basic self-care skills prior to discharge home with care partner.  Anticipate patient will require intermittent supervision and follow up outpatient.  OT - End of Session Activity Tolerance: Tolerates 30+ min activity with multiple rests Endurance Deficit: Yes OT Assessment Rehab Potential (ACUTE ONLY): Good OT Patient demonstrates impairments in the following area(s): Balance;Endurance;Motor;Safety OT Basic ADL's Functional Problem(s): Grooming;Bathing;Dressing;Toileting OT Transfers Functional Problem(s): Toilet;Tub/Shower OT Additional Impairment(s): None OT Plan OT Intensity: Minimum of 1-2 x/day, 45 to 90 minutes OT Frequency: 5 out of 7 days OT Duration/Estimated Length of Stay: 14-16 days OT Treatment/Interventions: Balance/vestibular training;Discharge planning;Pain management;Self Care/advanced ADL retraining;Therapeutic Activities;Functional mobility training;Patient/family education;Therapeutic Exercise;Community reintegration;DME/adaptive equipment instruction;Neuromuscular re-education;UE/LE Strength taining/ROM;Wheelchair propulsion/positioning OT Self Feeding Anticipated Outcome(s): mod I OT Basic Self-Care Anticipated Outcome(s): SBA OT Toileting Anticipated Outcome(s):  SBA OT Bathroom Transfers Anticipated Outcome(s): SBA OT Recommendation Recommendations for Other Services: Therapeutic Recreation consult Therapeutic Recreation Interventions: Pet therapy Patient destination: Home Follow Up Recommendations: Outpatient OT Equipment Recommended: 3 in 1 bedside comode;To be determined;Tub/shower bench Equipment Details: owns  RW and [facility]   OT Evaluation Precautions/Restrictions  Precautions Precautions: Back;Fall Precaution Comments: reviewed back precautions Required Braces or Orthoses: Spinal Brace Spinal Brace: Thoracolumbosacral orthotic;Applied in sitting position;Other (comment) Spinal Brace Comments: no brace ordered, but pt prefers for comfort Restrictions Weight Bearing Restrictions: No General Chart Reviewed: Yes Family/Caregiver Present: No Pain Pain Assessment Pain Scale: 0-10 Pain Score: 0-No pain Home Living/Prior Functioning Home Living Family/patient expects to be discharged to:: Private residence Living Arrangements: Children, Spouse/significant other Available Help at Discharge: Family, Available 24 hours/day Type of Home: Apartment Home Access: Stairs to enter [employer] of Steps: 1 Entrance Stairs-Rails: None Home Layout: 1/2 bath on main level, Bed/bath upstairs, Multi-level (1/2 bathroom main level) Alternate Level Stairs-Number of Steps: Flight Alternate Level Stairs-Rails: Right Bathroom Shower/Tub: Armed forces operational officer Accessibility: Yes  Lives With: Spouse, Family (wife and 3 kids all teenagers) IADL History Current License: Yes (driving until recently from numbness) Occupation: Full time employment (out of work since june d/t injury) Prior Function Level of Independence: Requires assistive device for independence, Independent with transfers, Independent with gait  Able to Take Stairs?: Yes Driving: Yes Vocation: On disability (short term disability since  june) 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[employer], Economist on busses, 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 Vision Baseline Vision/History: 0 No visual deficits (wears contacts) Ability to See in Adequate Light: 0 Adequate Patient Visual Report: Blurring of vision (one instance of blurred vision this week in Lt eye) Vision Assessment?: No apparent visual deficits Perception  Perception: Within Functional Limits Praxis Praxis: WFL Cognition Cognition Overall Cognitive Status: Within Functional Limits for tasks assessed Arousal/Alertness: Awake/alert Orientation Level: Person;Situation;Place Person: Oriented Place: Oriented Situation: Oriented Memory: Appears intact Awareness: Appears intact Problem Solving: Appears intact Safety/Judgment: Appears intact Brief Interview for Mental Status (BIMS) Repetition of Three Words (First Attempt): 3 Temporal Orientation: Year: Correct Temporal Orientation: Month: Accurate within 5 days Temporal Orientation: Day: Correct Recall: "Sock": Yes, no cue required Recall: "Blue": Yes, no cue required Recall: "Bed": Yes, no cue required BIMS Summary Score: 15 Sensation Sensation Light Touch: Impaired by gross assessment Central sensation comments: numbness/tingling in BLE, reports most ingling in bottom of feet/toes Peripheral sensation comments: (P) Pt reports 80-90% of full sensation with paresthesias in soles of feet Light Touch Impaired Details: Impaired RLE;Impaired LLE Hot/Cold: Appears Intact Proprioception: Appears Intact Stereognosis: Not tested Coordination Gross Motor Movements are Fluid and Coordinated: No Fine Motor Movements are Fluid and Coordinated: No Coordination and Movement Description: BUE WFL, BLE impaired Motor  Motor Motor: Paraplegia;Ataxia;Abnormal tone Motor - Skilled Clinical Observations: impaired BLE d/t compression, WFL BUE  Trunk/Postural Assessment  Cervical Assessment Cervical Assessment: Within  Functional Limits Thoracic Assessment Thoracic Assessment: Exceptions to [facility] (back precautions) Lumbar Assessment Lumbar Assessment: Exceptions to [facility] (back pre cautions) Postural Control Postural Control: Deficits on evaluation  Balance Balance Balance Assessed: Yes Static Sitting Balance Static Sitting - Balance Support: Feet supported;Bilateral upper extremity supported Static Sitting - Level of Assistance: 5: Stand by assistance Dynamic Sitting Balance Dynamic Sitting - Balance Support: Feet supported;Right upper extremity supported Dynamic Sitting - Level of Assistance: 4: Min assist Static Standing Balance Static Standing - Balance Support: Bilateral upper extremity supported;During functional activity Static Standing - Level of Assistance: 4: Min assist Dynamic Standing Balance Dynamic Standing - Balance Support: Bilateral upper extremity supported;During functional activity Dynamic Standing - Level of Assistance: 3: Mod assist Dynamic Standing - Balance Activities: Reaching for objects Dynamic Standing - Comments: no LOB/SOB Extremity/Trunk  Assessment RUE Assessment RUE Assessment: Within Functional Limits Active Range of Motion (AROM) Comments: WFL General Strength Comments: 4+/5 UE overall LUE Assessment LUE Assessment: Within Functional Limits Active Range of Motion (AROM) Comments: WFL General Strength Comments: 4+/5 UE overall  Care Tool Care Tool Self Care Eating   Eating Assist Level: Set up assist    Oral Care    Oral Care Assist Level: Supervision/Verbal cueing    Bathing         Assist Level: Moderate Assistance - Patient 50 - 74%    Upper Body Dressing(including orthotics)   What is the patient wearing?: Pull over shirt   Assist Level: Contact Guard/Touching assist    Lower Body Dressing (excluding footwear)   What is the patient wearing?: Underwear/pull up;Pants Assist for lower body dressing: Maximal Assistance - Patient 25 - 49%     Putting on/Taking off footwear   What is the patient wearing?: Socks Assist for footwear: Total Assistance - Patient < 25%       Care Tool Toileting Toileting activity   Assist for toileting: Maximal Assistance - Patient 25 - 49%     Care Tool Bed Mobility Roll left and right activity   Roll left and right assist level: Supervision/Verbal cueing    Sit to lying activity   Sit to lying assist level: Minimal Assistance - Patient > 75%    Lying to sitting on side of bed activity   Lying to sitting on side of bed assist level: the ability to move from lying on the back to sitting on the side of the bed with no back support.: Supervision/Verbal cueing     Care Tool Transfers Sit to stand transfer   Sit to stand assist level: Minimal Assistance - Patient > 75%    Chair/bed transfer   Chair/bed transfer assist level: Moderate Assistance - Patient 50 - 74%     Toilet transfer   Assist Level: Moderate Assistance - Patient 50 - 74%     Care Tool Cognition  Expression of Ideas and Wants Expression of Ideas and Wants: 4. Without difficulty (complex and basic) - expresses complex messages without difficulty and with speech that is clear and easy to understand  Understanding Verbal and Non-Verbal Content Understanding Verbal and Non-Verbal Content: 4. Understands (complex and basic) - clear comprehension without cues or repetitions   Memory/Recall Ability Memory/Recall Ability : Current season;Location of own room;That he or she is in a hospital/hospital unit   Refer to Care Plan for Long Term Goals  SHORT TERM GOAL WEEK 1 OT Short Term Goal 1 (Week 1): Pt will stand while grooming for >2:00 with AE as necessary with Min A OT Short Term Goal 2 (Week 1): Pt will complete bathing at overall Mod A with AE as necessary  Recommendations for other services: Therapeutic Recreation  Pet therapy   Skilled Therapeutic Intervention ADL ADL Eating: Set up Grooming:  Supervision/safety Where Assessed-Grooming: Edge of bed Upper Body Bathing: Minimal assistance Lower Body Bathing: Moderate assistance Upper Body Dressing: Supervision/safety Lower Body Dressing: Maximal assistance Toileting: Maximal assistance Where Assessed-Toileting: Bedside Commode Toilet Transfer: Moderate assistance Toilet Transfer Method: Stand pivot Toilet Transfer Equipment: Drop arm bedside commode Tub/Shower Transfer: Moderate assistance Tub/Shower Transfer Method: Stand pivot Tub/Shower Equipment: Insurance underwriter: Moderate assistance Film/video editor Method: Warden/ranger: Transfer tub bench ADL Comments: Slow stepping noted with transfers, heavy reliance on UE on RW for transfers, safe transfer techniquem increased assistance  required with LB ADLs Mobility  Bed Mobility Bed Mobility: Supine to Sit Supine to Sit: Supervision/Verbal cueing Transfers Sit to Stand: Minimal Assistance - Patient > 75% Stand to Sit: Moderate Assistance - Patient 50-74%  1:1 evaluation and treatment session initiated this date. OT roles, goals and purpose discussed with pt as well as therapy schedule. ADL completed this date with levels of assist listed above. Pt with increased assistance required with LB ADLs, would benefit from use of AE d/t spinal precautions. Pt was able to recall 3/3 spinal precautions throughout session and follow them. Pt able to don TLSO brace prior to transfers. No LOB/SOB during transfers, although slow, small step advancement during stand pivot transfers with heavy UE reliance on RW. Pt would benefit from skilled OT in IPR setting in order to maximize independence with ADLs upon D/C.    Discharge Criteria: Patient will be discharged from OT if patient refuses treatment 3 consecutive times without medical reason, if treatment goals not met, if there is a change in medical status, if patient makes no progress towards  goals or if patient is discharged from hospital.  The above assessment, treatment plan, treatment alternatives and goals were discussed and mutually agreed upon: by patient  Velia Meyer, OTD, OTR/L 12/25/2022, 12:32 PM

## 2022-12-25 NOTE — Progress Notes (Signed)
Inpatient Rehabilitation Care Coordinator Assessment and Plan Patient Details  Name: Mario [name] MRN: [id number] Date of Birth: July 01, 1980  Today's Date: 12/25/2022  Hospital Problems: Active Problems:   * No active hospital problems. *  Past Medical History:  Past Medical History:  Diagnosis Date   Bronchitis    Diabetes (HCC)    Diverticulitis 10/2015   Hypertension    Past Surgical History:  Past Surgical History:  Procedure Laterality Date   LUMBAR LAMINECTOMY/DECOMPRESSION MICRODISCECTOMY N/A 12/16/2022   Procedure: THORACIC DECOMPRESSION;  Surgeon: Estill Bamberg, MD;  Location: [facility] OR;  Service: Orthopedics;  Laterality: N/A;   Social History:  reports that he has been smoking cigarettes. He has never used smokeless tobacco. He reports current alcohol use. He reports that he does not currently use drugs after having used the following drugs: Cocaine.  Family / Support Systems Marital Status: Married How Long?: 9 years Patient Roles: Spouse, Parent Spouse/Significant Other: Mario [name] (wife) Children: 3 boys- 51, 43, and 46 yrs old. All three children live at home. Oldest son works at [employer] 3rd shift. Other Supports: PRN- children Anticipated Caregiver: Wife is primary caregiver Ability/Limitations of Caregiver: None reported Caregiver Availability: 24/7 Family Dynamics: Pt lives with his wife and three sons.  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 He stopped workign 09/29/22 due to medical reasons. Education: high school grad Health Literacy - How often do you need to have someone help you when you read instructions, pamphlets, or other written material from your doctor or pharmacy?: Never Writes: Yes Employment Status: Unemployed Date Retired/Disabled/Unemployed: Has not worked since May 2024 due to health issues. Legal  History/Current Legal Issues: Denies Guardian/Conservator: Denies   Abuse/Neglect Abuse/Neglect Assessment Can Be Completed: Yes Physical Abuse: Denies Verbal Abuse: Denies Sexual Abuse: Denies Exploitation of patient/patient's resources: Denies Self-Neglect: Denies  Patient response to: Social Isolation - How often do you feel lonely or isolated from those around you?: Never  Emotional Status Pt's affect, behavior and adjustment status: pt in good spirits at time of the visit Recent Psychosocial Issues: Denies Psychiatric History: Denies Substance Abuse History: pt admits to smoking cigarettes daily and 1pk of cigarettes lasts atleat 3 days. Admits to drinking a few beers on the weekends when he was working, however, it has since reduced. ADmits to occassional cocaine use.  Patient / Family Perceptions, Expectations & Goals Pt/Family understanding of illness & functional limitations: Pt and wife have a general understanding of pt care needs Premorbid pt/family roles/activities: Assistance with ADLs/IADLs; cognition intact Anticipated changes in roles/activities/participation: continued assistance with ADLs/IADLs Pt/family expectations/goals: pt goal is to work towards "beign able to get where I was before and using a RW and cane to get around my house/."  [employer]: None Premorbid Home Care/DME Agencies: None Transportation available at discharge: Wife Is the patient able to respond to transportation needs?: Yes In the past 12 months, has lack of transportation kept you from medical appointments or from getting medications?: No In the past 12 months, has lack of transportation kept you from meetings, work, or from getting things needed for daily living?: No Resource referrals recommended: Neuropsychology  Discharge Planning Living Arrangements: Children, Spouse/significant other Support Systems: Spouse/significant other Type of Residence: Private  residence Community education officer Resources: Media planner (specify) Herbalist) Financial Resources: Family Support Financial Screen Referred: No Living Expenses: Mortgage Money Management: Spouse, Patient Does the  patient have any problems obtaining your medications?: No Home Management: Wife manages all home care needs Patient/Family Preliminary Plans: TBD Care Coordinator Barriers to Discharge: Decreased caregiver support, Lack of/limited family support, Insurance for SNF coverage Care Coordinator Anticipated Follow Up Needs: HH/OP  Clinical Impression SW met with pt at bedside to introduce self, explain role and discuss discharge process. Pt is not a Cytogeneticist. No HCPOA. DME: RW and cane.  Pt reports his wife will be here later on today--SW will follow-up to introduce self.   *SW met with pt wife and family in room. SW introduced self, and informed will follow-up with ELOS as information is available.   Yulanda Diggs A Nathanel Tallman 12/25/2022, 1:36 PM

## 2022-12-25 NOTE — Evaluation (Signed)
Physical Therapy Assessment and Plan  Patient Details  Name: Mario [name] MRN: [id number] Date of Birth: 1980-10-01  PT Diagnosis: Ataxic gait, Difficulty walking, Impaired sensation, Low back pain, Muscle spasms, Muscle weakness, Paraplegia, and Pain in joint Rehab Potential: Excellent ELOS: 14-16 days   Today's Date: 12/25/2022 PT Individual Time: 4098-1191, 1422-1530 PT Individual Time Calculation (min): 60 min, 68 min   Hospital Problem: Active Problems:   * No active hospital problems. *   Past Medical History:  Past Medical History:  Diagnosis Date   Bronchitis    Diabetes (HCC)    Diverticulitis 10/2015   Hypertension    Past Surgical History:  Past Surgical History:  Procedure Laterality Date   LUMBAR LAMINECTOMY/DECOMPRESSION MICRODISCECTOMY N/A 12/16/2022   Procedure: THORACIC DECOMPRESSION;  Surgeon: Estill Bamberg, MD;  Location: [facility] OR;  Service: Orthopedics;  Laterality: N/A;    Assessment & Plan Clinical Impression: Mario [name] is a 42 year old male with history of HTN, T2DM, diverticulitis, back pain/strain in May who started developing balance issues fall and numbness from waist down. He had seen NS with plans for surgery but was admitted on 12/14/22 with worsening of pain, increase in numbness and increase in BLE weakness w/difficulty walking. He was admitted for work up and evaluated by Dr. 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 UDS positive for opiates and cocaine. He underwent T7-T10 laminectomy with bilateral partial facetectomy and spinal cord decompression on 08/27.    Post op has had some improvement in weakness but continues to have R>LLE weakness and working on standing in [place]. Back brace ordered per patient  preference. He continues to be limited by weakness, numbness from waist down as well as LE spasms. He was independent and working till 09/2022 but had steady decline needing cane to walker 3 weeks PTA--CIR recommended due to functional decline.  Feels numbness from abdomen downward.  Patient transferred to CIR on 12/24/2022 .    Patient currently requires mod with mobility secondary to muscle weakness and muscle joint tightness, decreased cardiorespiratoy endurance, impaired timing and sequencing, abnormal tone, unbalanced muscle activation, and ataxia, and decreased standing balance, decreased balance strategies, and difficulty maintaining precautions.  Prior to hospitalization, patient was modified independent  with mobility and lived with Spouse, Family (wife and 3 kids all teenagers) in a Apartment home.  Home access is 1Stairs to enter.  Patient will benefit from skilled PT intervention to maximize safe functional mobility, minimize fall risk, and decrease caregiver burden for planned discharge home with 24 hour supervision.  Anticipate patient will benefit from follow up OP at discharge.  PT - End of Session Activity Tolerance: Tolerates 30+ min activity with multiple rests Endurance Deficit: Yes Endurance Deficit Description: motor impersistence PT Assessment Rehab Potential (ACUTE/IP ONLY): Excellent PT Barriers to Discharge: Home environment access/layout;Neurogenic Bowel & Bladder;Wound Care;Decreased caregiver support PT Patient demonstrates impairments in the following area(s): Balance;Pain;Safety;Endurance;Sensory;Motor;Skin Integrity PT Transfers Functional Problem(s): Bed Mobility;Bed to Chair;Car PT Locomotion Functional Problem(s): Ambulation;Wheelchair Mobility PT Plan PT Intensity: Minimum of 1-2 x/day ,45 to 90 minutes PT Frequency: 5 out of 7 days PT Duration Estimated Length of Stay: 14-16 days PT Treatment/Interventions: Ambulation/gait training;Discharge planning;DME/adaptive  equipment instruction;Functional mobility training;Pain management;Psychosocial support;Splinting/orthotics;Therapeutic Activities;Visual/perceptual remediation/compensation;UE/LE Strength taining/ROM;Wheelchair propulsion/positioning;Stair training;Therapeutic Exercise;UE/LE Coordination activities;Skin care/wound management;Patient/family education;Neuromuscular re-education;Functional  electrical stimulation;Disease management/prevention;Community reintegration;Balance/vestibular training PT Transfers Anticipated Outcome(s): supervision with LRAd PT Locomotion Anticipated Outcome(s): supervision with LRAD in home, w/c community PT Recommendation Follow Up Recommendations: Outpatient PT Patient destination: Home Equipment Recommended: To be determined Equipment Details: Likely RW, TBD standard vs custom w/c   PT Evaluation Precautions/Restrictions Precautions Precautions: Back;Fall Precaution Comments: reviewed back precautions Required Braces or Orthoses: Spinal Brace Spinal Brace: Thoracolumbosacral orthotic;Applied in sitting position;Other (comment) Spinal Brace Comments: no brace ordered, but pt prefers for comfort Restrictions Weight Bearing Restrictions: No General   Vital Signs  Pain Pain Assessment Pain Scale: 0-10 Pain Score: 0-No pain Pain Interference Pain Interference Pain Effect on Sleep: 2. Occasionally Pain Interference with Therapy Activities: 1. Rarely or not at all Pain Interference with Day-to-Day Activities: 1. Rarely or not at all Home Living/Prior Functioning Home Living Living Arrangements: Children;Spouse/significant other Available Help at Discharge: Family;Available 24 hours/day Type of Home: Apartment Home Access: Stairs to enter Entrance Stairs-Number of Steps: 1 Entrance Stairs-Rails: None Home Layout: 1/2 bath on main level;Bed/bath upstairs;Multi-level (1/2 bathroom main level) Alternate Level Stairs-Number of Steps: Flight Alternate Level  Stairs-Rails: Right Bathroom Shower/Tub: Engineer, manufacturing systems: Standard Bathroom Accessibility: Yes  Lives With: Spouse;Family (wife and 3 kids all teenagers) Prior Function Level of Independence: Requires assistive device for independence;Independent with transfers;Independent with gait  Able to Take Stairs?: Yes Driving: Yes Vocation: On disability (short term disability since june) 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[employer], Economist on busses, 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 Vision/Perception  Vision - History Ability to See in Adequate Light: 0 Adequate Perception Perception: Within Functional Limits Praxis Praxis: WFL  Cognition Overall Cognitive Status: Within Functional Limits for tasks assessed Arousal/Alertness: Awake/alert Orientation Level: Oriented X4 Day of Week: Correct Memory: Appears intact Awareness: Appears intact Problem Solving: Appears intact Safety/Judgment: Appears intact Sensation Sensation Light Touch: Impaired by gross assessment Central sensation comments: numbness/tingling in BLE, reports most ingling in bottom of feet/toes Peripheral sensation comments: (P) Pt reports 80-90% of full sensation with paresthesias in soles of feet Light Touch Impaired Details: Impaired RLE;Impaired LLE Hot/Cold: Appears Intact Proprioception: Appears Intact Stereognosis: Not tested Coordination Gross Motor Movements are Fluid and Coordinated: No Fine Motor Movements are Fluid and Coordinated: No Coordination and Movement Description: BUE WFL, BLE impaired Motor  Motor Motor: Paraplegia;Ataxia;Abnormal tone Motor - Skilled Clinical Observations: impaired BLE d/t compression, WFL BUE   Trunk/Postural Assessment  Cervical Assessment Cervical Assessment: Within Functional Limits Thoracic Assessment Thoracic Assessment: Exceptions to [facility] (back precautions) Lumbar Assessment Lumbar Assessment: Exceptions to [facility] (back pre cautions) Postural  Control Postural Control: Deficits on evaluation  Balance Balance Balance Assessed: Yes Static Sitting Balance Static Sitting - Balance Support: Feet supported;Bilateral upper extremity supported Static Sitting - Level of Assistance: 5: Stand by assistance Dynamic Sitting Balance Dynamic Sitting - Balance Support: Feet supported;Right upper extremity supported Dynamic Sitting - Level of Assistance: 4: Min assist Static Standing Balance Static Standing - Balance Support: Bilateral upper extremity supported;During functional activity Static Standing - Level of Assistance: 4: Min assist Dynamic Standing Balance Dynamic Standing - Balance Support: Bilateral upper extremity supported;During functional activity Dynamic Standing - Level of Assistance: 3: Mod assist Dynamic Standing - Balance Activities: Reaching for objects Dynamic Standing - Comments: no LOB/SOB Extremity Assessment  RUE Assessment RUE Assessment: Within Functional Limits Active Range of Motion (AROM) Comments: WFL General Strength Comments: 4+/5 UE overall LUE Assessment LUE Assessment: Within Functional Limits Active Range of Motion (AROM) Comments: WFL General Strength Comments:  4+/5 UE overall RLE Assessment RLE Assessment: Exceptions to [facility] RLE Strength Right Hip Flexion: 3+/5 Right Knee Flexion: 3+/5 Right Knee Extension: 3+/5 Right Ankle Dorsiflexion: 3-/5 Right Ankle Plantar Flexion: 3-/5 LLE Assessment LLE Assessment: Exceptions to [facility] LLE Strength Left Hip Flexion: 3+/5 Left Knee Flexion: 4/5 Left Knee Extension: 4/5 Left Ankle Dorsiflexion: 4/5 Left Ankle Plantar Flexion: 4/5  Care Tool  Care Tool Bed Mobility Roll left and right activity   Roll left and right assist level: Supervision/Verbal cueing    Sit to lying activity   Sit to lying assist level: Minimal Assistance - Patient > 75%    Lying to sitting on side of bed activity   Lying to sitting on side of bed assist level: the ability to  move from lying on the back to sitting on the side of the bed with no back support.: Supervision/Verbal cueing     Care Tool Transfers Sit to stand transfer   Sit to stand assist level: Minimal Assistance - Patient > 75%    Chair/bed transfer   Chair/bed transfer assist level: Moderate Assistance - Patient 50 - 74%     Toilet transfer   Assist Level: Moderate Assistance - Patient 50 - 74%    Car transfer   Car transfer assist level: Moderate Assistance - Patient 50 - 74%      Care Tool Locomotion Ambulation   Assist level: 2 helpers Assistive device: Walker-rolling Max distance: 42 ft  Walk 10 feet activity   Assist level: 2 helpers Assistive device: Walker-rolling   Walk 50 feet with 2 turns activity Walk 50 feet with 2 turns activity did not occur: Safety/medical concerns      Walk 150 feet activity Walk 150 feet activity did not occur: Safety/medical concerns      Walk 10 feet on uneven surfaces activity Walk 10 feet on uneven surfaces activity did not occur: Safety/medical concerns      Stairs Stair activity did not occur: Safety/medical concerns        Walk up/down 1 step activity  Stair activity did not occur: Safety/medical concerns      Walk up/down 4 steps activity  Stair activity did not occur: Safety/medical concerns      Walk up/down 12 steps activity  Stair activity did not occur: Safety/medical concerns      Pick up small objects from floor   did not occur: Safety/medical concerns      Wheelchair    100 ft Supervision         Wheel 50 feet with 2 turns activity  Supervision     Wheel 150 feet activity  Min A       Refer to Care Plan for Long Term Goals  SHORT TERM GOAL WEEK 1 PT Short Term Goal 1 (Week 1): Pt will ambulate >50 ft with assist PT Short Term Goal 2 (Week 1): Pt will progress to consistent min A STS and intemittent CGA PT Short Term Goal 3 (Week 1): Pt will self propel w/c throughout whole session.  Recommendations for  other services: None   Skilled Therapeutic Intervention Session 1: Evaluation completed (see details above) with patient education regarding purpose of PT evaluation, PT POC and goals, therapy schedule, weekly team meetings, and other CIR information including safety plan and fall risk safety. Pt reports unrated low back and R ankle pain, premedicated. Rest and positioning provided as needed.  Pt performed the below functional mobility tasks with the specified levels of skilled cuing  and assistance.  Session focused on building rapport, assessing strength and sensation, and Stand pivot transfer transfers. Pt returned to bed after session, was left with all needs in reach and alarm active.   Session 2: pt received in bed and agreeable to therapy. Pt recd pain medication at start of session for 8/10 pain in Low back and L side. Stand pivot transfer with min A overall from elevated EOB.  Pt propelled w/c with BUE x 100 ft. Pt then transported to retrieve solid back for w/c, pt reports improved comfort.   Performed car transfer with min A overall and cueing to maintain back precautions, increased time to problem solve.   Pt then performed step taps on 3" step x 2 BIL with extra support on RLE in single leg stance.  Gait training with RW and w.c follow x 42 ft and x 30 ft with min-mod A for balance and safety. Mild ataxia noted and steppage type gait pattern at times. Defaults to knee hyperextension, so cues for softer knee in stance. Pt reports he feels his ankle is buckling at times (not knee). Trialled DF assist ace wrap with minimal success. Might be appropriate for stiffer PLS AFO.   Pt returned to room and performed min A Stand pivot transfer to commode. Pt remained on commode with NT aware of pt position.   Mobility Bed Mobility Bed Mobility: Supine to Sit Supine to Sit: Supervision/Verbal cueing Transfers Transfers: Sit to Stand;Stand to Sit;Stand Pivot Transfers Sit to Stand: Minimal  Assistance - Patient > 75% Stand to Sit: Moderate Assistance - Patient 50-74% Stand Pivot Transfers: Moderate Assistance - Patient 50 - 74% Stand Pivot Transfer Details: Verbal cues for safe use of DME/AE;Verbal cues for precautions/safety;Manual facilitation for weight shifting Transfer (Assistive device): Rolling walker Locomotion  Gait Ambulation: Yes Gait Assistance: 2 Helpers Gait Distance (Feet): 42 Feet Assistive device: Rolling walker Gait Assistance Details: Verbal cues for precautions/safety;Verbal cues for safe use of DME/AE Gait Assistance Details: min A +w/c follow Gait Gait: Yes Gait Pattern: Impaired Gait Pattern: Ataxic;Right genu recurvatum;Left genu recurvatum Gait velocity: Significant decrease in cadence. Stairs / Additional Locomotion Stairs: No Corporate treasurer: Yes Wheelchair Assistance: Doctor, general practice: Both upper extremities Wheelchair Parts Management: Needs assistance Distance: 178ft   Discharge Criteria: Patient will be discharged from PT if patient refuses treatment 3 consecutive times without medical reason, if treatment goals not met, if there is a change in medical status, if patient makes no progress towards goals or if patient is discharged from hospital.  The above assessment, treatment plan, treatment alternatives and goals were discussed and mutually agreed upon: by patient  [name] 12/25/2022, 12:43 PM

## 2022-12-26 DIAGNOSIS — R252 Cramp and spasm: Secondary | ICD-10-CM

## 2022-12-26 DIAGNOSIS — G8220 Paraplegia, unspecified: Secondary | ICD-10-CM

## 2022-12-26 DIAGNOSIS — K592 Neurogenic bowel, not elsewhere classified: Secondary | ICD-10-CM | POA: Diagnosis not present

## 2022-12-26 DIAGNOSIS — S24109D Unspecified injury at unspecified level of thoracic spinal cord, subsequent encounter: Secondary | ICD-10-CM

## 2022-12-26 DIAGNOSIS — I10 Essential (primary) hypertension: Secondary | ICD-10-CM | POA: Diagnosis not present

## 2022-12-26 LAB — GLUCOSE, CAPILLARY
Glucose-Capillary: 115 mg/dL — ABNORMAL HIGH (ref 70–99)
Glucose-Capillary: 99 mg/dL (ref 70–99)
Glucose-Capillary: 95 mg/dL (ref 70–99)
Glucose-Capillary: 83 mg/dL (ref 70–99)

## 2022-12-26 MED ORDER — SORBITOL 70 % SOLN
60.0000 mL | Status: AC
  Administered 2022-12-26: 60 mL via ORAL
  Filled 2022-12-26: qty 60

## 2022-12-26 MED ORDER — ENOXAPARIN SODIUM 30 MG/0.3ML IJ SOSY
30.0000 mg | PREFILLED_SYRINGE | Freq: Two times a day (BID) | INTRAMUSCULAR | Status: DC
  Administered 2022-12-26 – 2023-01-07 (×24): 30 mg via SUBCUTANEOUS
  Filled 2022-12-26 (×24): qty 0.3

## 2022-12-26 MED ORDER — POLYETHYLENE GLYCOL 3350 17 G PO PACK
17.0000 g | PACK | Freq: Every day | ORAL | Status: DC
  Filled 2022-12-26 (×10): qty 1

## 2022-12-26 NOTE — Progress Notes (Signed)
Patient ID: Mario [name], male   DOB: Jun 18, 1980, 42 y.o.   MRN: [id number]  SW spoke with pt wife to inform on ELOS. SW will continue to provide updates after team conference.   Cecile Sheerer, MSW, LCSWA Office: [phone] Cell: [phone] Fax: [phone]

## 2022-12-26 NOTE — Plan of Care (Signed)
  Problem: RH BOWEL ELIMINATION Goal: RH STG MANAGE BOWEL WITH ASSISTANCE Description: STG Manage Bowel with supervision Assistance. Outcome: Not Progressing; order for laxatives and SS enema

## 2022-12-26 NOTE — Progress Notes (Signed)
Physical Therapy Session Note  Patient Details  Name: Mario [name] MRN: [id number] Date of Birth: 01-09-1981  Today's Date: 12/26/2022 PT Individual Time: 0835-0920 PT Individual Time Calculation (min): 45 min   Short Term Goals: Week 1:  PT Short Term Goal 1 (Week 1): Pt will ambulate >50 ft with assist PT Short Term Goal 2 (Week 1): Pt will progress to consistent min A STS and intemittent CGA PT Short Term Goal 3 (Week 1): Pt will self propel w/c throughout whole session.  Skilled Therapeutic Interventions/Progress Updates:    Pt seated in w/c on arrival and agreeable to therapy. Pt reports 9/10 pain, nsg present to administer pain meds on arrival.   Donned shoes tot A with increased time d/t ankle height sneakers. Pt propelled w/c with BUE to gym with supervision.  Gait training 2 x 16 ft with mod A, pt reprots increased fatigue and that he is more aware of numbness, but gait appears with mildly improved ataxia vs yesterday (possibly d/t weight of shoes). Still with heavy UE reliance and knee hyperextension, and low foot clearance.   Pt returned to room and performed ambulatory transfer to bed, min A sit>supine. Pt remained in bed and was left with all needs in reach and alarm active.   Therapy Documentation Precautions:  Precautions Precautions: Back, Fall Precaution Comments: reviewed back precautions Required Braces or Orthoses: Spinal Brace Spinal Brace: Thoracolumbosacral orthotic, Applied in sitting position, Other (comment) Spinal Brace Comments: no brace ordered, but pt prefers for comfort Restrictions Weight Bearing Restrictions: No General:       Therapy/Group: Individual Therapy  [name] 12/26/2022, 9:37 AM

## 2022-12-26 NOTE — Progress Notes (Signed)
Occupational Therapy Session Note  Patient Details  Name: Mario [name] MRN: [id number] Date of Birth: 12-02-1980  Today's Date: 12/26/2022 OT Individual Time: 7829-5621 OT Individual Time Calculation (min): 75 min    Short Term Goals: Week 1:  OT Short Term Goal 1 (Week 1): Pt will stand while grooming for >2:00 with AE as necessary with Min A OT Short Term Goal 2 (Week 1): Pt will complete bathing at overall Mod A with AE as necessary  Skilled Therapeutic Interventions/Progress Updates:    Pt sleeping upon arrival. Min verbal cues to arouse. After eating breakfast pt engaged in bathing/dressing with sit<>stand from w/c at sink. Supine>sit EOB with CGA using bed rails. Stand pivot transfer to w/c with CGA. Sit<>stand from w/c at sink with CGA. CGA for standing balance. Pt required assistance bating BLE without use of AE. Pt used reacher to remove and don pants. Dependent for donning socks. Reviewed LTG role of OT. Pt remained in w/c with all needs within reach. Belt alarm activated.   Therapy Documentation Precautions:  Precautions Precautions: Back, Fall Precaution Comments: reviewed back precautions Required Braces or Orthoses: Spinal Brace Spinal Brace: Thoracolumbosacral orthotic, Applied in sitting position, Other (comment) Spinal Brace Comments: no brace ordered, but pt prefers for comfort Restrictions Weight Bearing Restrictions: No Pain:  Pt reports his pain is "tolerable"   Therapy/Group: Individual Therapy  [name] 12/26/2022, 8:22 AM

## 2022-12-26 NOTE — Progress Notes (Signed)
Occupational Therapy Session Note  Patient Details  Name: Mario [name] MRN: [id number] Date of Birth: [birth year]  Today's Date: 12/26/2022 OT Individual Time: 7829-5621 OT Individual Time Calculation (min): 71 min    Short Term Goals: Week 1:  OT Short Term Goal 1 (Week 1): Pt will stand while grooming for >2:00 with AE as necessary with Min A OT Short Term Goal 2 (Week 1): Pt will complete bathing at overall Mod A with AE as necessary  Skilled Therapeutic Interventions/Progress Updates:    Pt resting in bed upon arrival. Supine>sit EOB with supervision using bed rails. Sit>stand from EOB and stand pivot transfer with RW to w/c with CGA. Initial focus on DME discussion/demonstration. OTA demonstrated TTB and planned on practice next week. Focus changed to static standing balance with only one UE support. Pt stood at [employer] and placed [place] with RUE. Pt removed [place] with LUE. Pt stood at table and cleaned [place] using BUE with one UE resting on table. RLE knee lifts seated in w/c 5x6. Pt returned to room and remained in w/c with Belt alarm activated. All needs within reach.   Therapy Documentation Precautions:  Precautions Precautions: Back, Fall Precaution Comments: reviewed back precautions Required Braces or Orthoses: Spinal Brace Spinal Brace: Thoracolumbosacral orthotic, Applied in sitting position, Other (comment) Spinal Brace Comments: no brace ordered, but pt prefers for comfort Restrictions Weight Bearing Restrictions: No   Pain: Pt reports his pain is "ok"  Therapy/Group: Individual Therapy  [name] 12/26/2022, 12:02 PM

## 2022-12-26 NOTE — Progress Notes (Signed)
Patient refused SCDs.

## 2022-12-26 NOTE — Progress Notes (Signed)
PROGRESS NOTE   Subjective/Complaints: Pt up with PT in gym. Pain under reasonable control with meds, as are spasms. Didn't feel he had to work as hard today to move as he did yesterday.  ROS: Patient denies fever, rash, sore throat, blurred vision, dizziness, nausea, vomiting, diarrhea, cough, shortness of breath or chest pain,  headache, or mood change.   Objective:   VAS [place] LOWER EXTREMITY VENOUS (DVT)  Result Date: 12/26/2022  Lower Venous DVT Study Patient Name:  Mario [name]  Date of Exam:   12/25/2022 Medical Rec #: [id number]        Accession #:    [id number] Date of Birth: 1981/02/16        Patient Gender: M Patient Age:   42 years Exam Location:  [facility] Procedure:      VAS [place] LOWER EXTREMITY VENOUS (DVT) Referring Phys: PAMELA LOVE --------------------------------------------------------------------------------  Indications: Edema. Other Indications: Rehab patient. Comparison Study: Previous exam (RLEV) on 12/19/2022 was negative for DVT. Performing Technologist: Ernestene Mention RVT, RDMS  Examination Guidelines: A complete evaluation includes B-mode imaging, spectral Doppler, color Doppler, and power Doppler as needed of all accessible portions of each vessel. Bilateral testing is considered an integral part of a complete examination. Limited examinations for reoccurring indications may be performed as noted. The reflux portion of the exam is performed with the patient in reverse Trendelenburg.  +---------+---------------+---------+-----------+----------+--------------+ RIGHT    CompressibilityPhasicitySpontaneityPropertiesThrombus Aging +---------+---------------+---------+-----------+----------+--------------+ CFV      Full           Yes      Yes                                 +---------+---------------+---------+-----------+----------+--------------+ SFJ      Full                                                         +---------+---------------+---------+-----------+----------+--------------+ FV Prox  Full           Yes      Yes                                 +---------+---------------+---------+-----------+----------+--------------+ FV Mid   Full           Yes      Yes                                 +---------+---------------+---------+-----------+----------+--------------+ FV DistalFull           Yes      Yes                                 +---------+---------------+---------+-----------+----------+--------------+ PFV      Full                                                        +---------+---------------+---------+-----------+----------+--------------+  POP      Full           Yes      Yes                                 +---------+---------------+---------+-----------+----------+--------------+ PTV      Full                                                        +---------+---------------+---------+-----------+----------+--------------+ PERO     Full                                                        +---------+---------------+---------+-----------+----------+--------------+   +---------+---------------+---------+-----------+----------+--------------+ LEFT     CompressibilityPhasicitySpontaneityPropertiesThrombus Aging +---------+---------------+---------+-----------+----------+--------------+ CFV      Full           Yes      Yes                                 +---------+---------------+---------+-----------+----------+--------------+ SFJ      Full                                                        +---------+---------------+---------+-----------+----------+--------------+ FV Prox  Full           Yes      Yes                                 +---------+---------------+---------+-----------+----------+--------------+ FV Mid   Full           Yes      Yes                                  +---------+---------------+---------+-----------+----------+--------------+ FV DistalFull           Yes      Yes                                 +---------+---------------+---------+-----------+----------+--------------+ PFV      Full                                                        +---------+---------------+---------+-----------+----------+--------------+ POP      Full           Yes      Yes                                 +---------+---------------+---------+-----------+----------+--------------+ PTV  Full                                                        +---------+---------------+---------+-----------+----------+--------------+ PERO     Full                                                        +---------+---------------+---------+-----------+----------+--------------+     Summary: BILATERAL: - No evidence of deep vein thrombosis seen in the lower extremities, bilaterally. -No evidence of popliteal cyst, bilaterally.   *See table(s) above for measurements and observations. Electronically signed by Gerarda Fraction on 12/26/2022 at 8:44:14 AM.    Final    DG Abd 1 View  Result Date: 12/24/2022 CLINICAL DATA:  Constipation EXAM: ABDOMEN - 1 VIEW COMPARISON:  None Available. FINDINGS: Large colonic stool burden. Nonobstructive bowel gas pattern. Assessment of the sacrum is limited due to overlying bowel gas. No pneumatosis. No supine evidence of pneumoperitoneum. Lung bases are excluded from the field of view. No acute osseous abnormality. Degenerative changes in the lumbar spine. IMPRESSION: Large colonic stool burden Electronically Signed   By: Lorenza Cambridge M.D.   On: 12/24/2022 18:30   Recent Labs    12/25/22 0550  WBC 11.0*  HGB 15.5  HCT 46.3  PLT 517*   Recent Labs    12/25/22 0550  NA 132*  K 4.4  CL 93*  CO2 27  GLUCOSE 137*  BUN 14  CREATININE 1.01  CALCIUM 10.3    Intake/Output Summary (Last 24 hours) at 12/26/2022 9604 Last data  filed at 12/26/2022 0849 Gross per 24 hour  Intake 1440 ml  Output 2750 ml  Net -1310 ml        Physical Exam: Vital Signs Blood pressure (!) 142/84, pulse 80, temperature 97.7 F (36.5 C), resp. rate 18, height 6\' 1"  (1.854 m), weight 129.3 kg, SpO2 98%.  General: Alert and oriented x 3, No apparent distress HEENT: Head is normocephalic, atraumatic, PERRLA, EOMI, sclera anicteric, oral mucosa pink and moist, dentition intact, ext ear canals clear,  Neck: Supple without JVD or lymphadenopathy Heart: Reg rate and rhythm. No murmurs rubs or gallops Chest: CTA bilaterally without wheezes, rales, or rhonchi; no distress Abdomen: Soft, non-tender, non-distended, bowel sounds positive. Extremities: No clubbing, cyanosis, or edema. Pulses are 2+ Psych: Pt's affect is appropriate. Pt is cooperative Skin: Clean and intact without signs of breakdown. Back incision CDI Neuro:  Alert and oriented x 3. Normal insight and awareness. Intact Memory. Normal language and speech. Cranial nerve exam unremarkable. MMT: UE motor 5/5. LLE 3- to 3/5 HF, KE and 1+ PF and tr-1 ADF. RLE: 3- to 3/5 HF, KE and 1 APF, tr ADF.  T8 sensory level. Can sense LT and pain in both legs, lesser on feet?, DTR's 1+, no resting tone today. No clonus appreciated Musculoskeletal: back in TLSO, tender with sitting.     Assessment/Plan: 1. Functional deficits which require 3+ hours per day of interdisciplinary therapy in a comprehensive inpatient rehab setting. Physiatrist is providing close team supervision and 24 hour management of active medical problems listed below. Physiatrist and rehab team continue to assess barriers to discharge/monitor patient  progress toward functional and medical goals  Care Tool:  Bathing    Body parts bathed by patient: Right arm, Left arm, Chest, Abdomen, Front perineal area, Buttocks, Right upper leg, Left upper leg, Face   Body parts bathed by helper: Right lower leg, Left lower leg      Bathing assist Assist Level: Minimal Assistance - Patient > 75%     Upper Body Dressing/Undressing Upper body dressing   What is the patient wearing?: Pull over shirt    Upper body assist Assist Level: Set up assist    Lower Body Dressing/Undressing Lower body dressing      What is the patient wearing?: Underwear/pull up, Pants     Lower body assist Assist for lower body dressing: Maximal Assistance - Patient 25 - 49%     Toileting Toileting    Toileting assist Assist for toileting: Maximal Assistance - Patient 25 - 49%     Transfers Chair/bed transfer  Transfers assist     Chair/bed transfer assist level: Moderate Assistance - Patient 50 - 74%     Locomotion Ambulation   Ambulation assist      Assist level: 2 helpers Assistive device: Walker-rolling Max distance: 42 ft   Walk 10 feet activity   Assist     Assist level: 2 helpers Assistive device: Walker-rolling   Walk 50 feet activity   Assist Walk 50 feet with 2 turns activity did not occur: Safety/medical concerns         Walk 150 feet activity   Assist Walk 150 feet activity did not occur: Safety/medical concerns         Walk 10 feet on uneven surface  activity   Assist Walk 10 feet on uneven surfaces activity did not occur: Safety/medical concerns         Wheelchair     Assist Is the patient using a wheelchair?: Yes Type of Wheelchair: Manual    Wheelchair assist level: Supervision/Verbal cueing Max wheelchair distance: 100    Wheelchair 50 feet with 2 turns activity    Assist        Assist Level: Supervision/Verbal cueing   Wheelchair 150 feet activity     Assist      Assist Level: Minimal Assistance - Patient > 75%   Blood pressure (!) 142/84, pulse 80, temperature 97.7 F (36.5 C), resp. rate 18, height 6\' 1"  (1.854 m), weight 129.3 kg, SpO2 98%.  Medical Problem List and Plan: 1. 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  Antithrombotics: -DVT/anticoagulation:  Mechanical: Sequential compression devices, below knee Bilateral lower extremities              3. Pain Management:  PRN oxycodone seems to be helping to control pain with activity 4. Mood/Behavior/Sleep: LCSW to follow for evaluation and support.              -antipsychotic agents: N/A             --trazodone prn for insomnia.  5. Neuropsych/cognition: This patient is capable of making decisions on his own behalf. 6. Skin/Wound Care: Routine pressure relief measures. 7. Fluids/Electrolytes/Nutrition: encourage PO  -mild hyponatremia--will recheck  on Monday. No obvious causes at this time 8. HTN: Monitor BP TID--continue avapro for diovan 9. T2DM: Hgb A1c- Monitor BS ac/hs and use SSI for elevated BS             --continue metformin daily CBG (last 3)  Recent Labs    12/25/22 1756 12/25/22 2050 12/26/22 0606  GLUCAP 102* 150* 115*    9/6 CBG's controlled 10. Neurogenic bowel: - Enema administered with only small result-->KUB showed moderate to large stool burden.              --continue Senna S to 2 tabs bid -schedule miralax starting tomorrow 9/6 give sorbitol 60cc today, SSE if needed in pm  11. Spasticity: continue baclofen 5mg  QID--this dose seems to be working for him without causing too much fatigue or weakness      LOS: 2 days A FACE TO FACE EVALUATION WAS PERFORMED  Ranelle Oyster 12/26/2022, 9:22 AM

## 2022-12-26 NOTE — Care Management (Signed)
[facility]patient Rehabilitation Center Individual Statement of Services  Patient Name:  Mario [name]  Date:  12/26/2022  Welcome to the Inpatient Rehabilitation Center.  Our goal is to provide you with an individualized program based on your diagnosis and situation, designed to meet your specific needs.  With this comprehensive rehabilitation program, you will be expected to participate in at least 3 hours of rehabilitation therapies Monday-Friday, with modified therapy programming on the weekends.  Your rehabilitation program will include the following services:  Physical Therapy (PT), Occupational Therapy (OT), 24 hour per day rehabilitation nursing, Therapeutic Recreaction (TR), Psychology, Neuropsychology, Care Coordinator, Rehabilitation Medicine, Nutrition Services, Pharmacy Services, and Other  Weekly team conferences will be held on Tuesdays to discuss your progress.  Your Inpatient Rehabilitation Care Coordinator will talk with you frequently to get your input and to update you on team discussions.  Team conferences with you and your family in attendance may also be held.  Expected length of stay: 14-16 days    Overall anticipated outcome: Supervision  Depending on your progress and recovery, your program may change. Your Inpatient Rehabilitation Care Coordinator will coordinate services and will keep you informed of any changes. Your Inpatient Rehabilitation Care Coordinator's name and contact numbers are listed  below.  The following services may also be recommended but are not provided by the Inpatient Rehabilitation Center:  Driving Evaluations Home Health Rehabiltiation Services Outpatient Rehabilitation Services Vocational Rehabilitation   Arrangements will be made to provide these services after discharge if needed.  Arrangements include referral to agencies that provide these services.  Your insurance has been verified to be:  BCBS  Your primary doctor is:  [facility]  Pertinent information will be shared with your doctor and your insurance company.  Inpatient Rehabilitation Care Coordinator:  Susie Cassette [phone] or ([phone]  Information discussed with and copy given to patient by: Gretchen Short, 12/26/2022, 9:07 AM

## 2022-12-26 NOTE — Progress Notes (Signed)
Dr. Marshell Levan PA returned called to clear initiation of ac.

## 2022-12-27 DIAGNOSIS — I10 Essential (primary) hypertension: Secondary | ICD-10-CM | POA: Diagnosis not present

## 2022-12-27 DIAGNOSIS — G4700 Insomnia, unspecified: Secondary | ICD-10-CM

## 2022-12-27 DIAGNOSIS — M4714 Other spondylosis with myelopathy, thoracic region: Secondary | ICD-10-CM

## 2022-12-27 DIAGNOSIS — R739 Hyperglycemia, unspecified: Secondary | ICD-10-CM

## 2022-12-27 DIAGNOSIS — K592 Neurogenic bowel, not elsewhere classified: Secondary | ICD-10-CM | POA: Diagnosis not present

## 2022-12-27 LAB — GLUCOSE, CAPILLARY
Glucose-Capillary: 160 mg/dL — ABNORMAL HIGH (ref 70–99)
Glucose-Capillary: 103 mg/dL — ABNORMAL HIGH (ref 70–99)
Glucose-Capillary: 100 mg/dL — ABNORMAL HIGH (ref 70–99)
Glucose-Capillary: 117 mg/dL — ABNORMAL HIGH (ref 70–99)

## 2022-12-27 MED ORDER — MELATONIN 5 MG PO TABS
5.0000 mg | ORAL_TABLET | Freq: Every evening | ORAL | Status: DC | PRN
  Administered 2022-12-27 – 2023-01-08 (×13): 5 mg via ORAL
  Filled 2022-12-27 (×13): qty 1

## 2022-12-27 NOTE — IPOC Note (Signed)
Overall Plan of Care [facility]) Patient Details Name: Mario [name] MRN: [id number] DOB: 10/09/1980  Admitting Diagnosis: <principal problem not specified>  Hospital Problems: Active Problems:   Thoracic myelopathy     Functional Problem List: Nursing Safety, Bowel, Sensory, Skin Integrity, Edema, Endurance, Medication Management, Motor, Pain  PT Balance, Pain, Safety, Endurance, Sensory, Motor, Skin Integrity  OT Balance, Endurance, Motor, Safety  SLP    TR         Basic ADL's: OT Grooming, Bathing, Dressing, Toileting     Advanced  ADL's: OT       Transfers: PT Bed Mobility, Bed to Chair, Customer service manager, Tub/Shower     Locomotion: PT Ambulation, Wheelchair Mobility     Additional Impairments: OT None  SLP        TR      Anticipated Outcomes Item Anticipated Outcome  Self Feeding mod I  Swallowing      Basic self-care  SBA  Toileting  SBA   Bathroom Transfers SBA  Bowel/Bladder  continent B/B  Transfers  supervision with LRAd  Locomotion  supervision with LRAD in home, w/c community  Communication     Cognition     Pain  less than 4  Safety/Judgment  no falls   Therapy Plan: PT Intensity: Minimum of 1-2 x/day ,45 to 90 minutes PT Frequency: 5 out of 7 days PT Duration Estimated Length of Stay: 14-16 days OT Intensity: Minimum of 1-2 x/day, 45 to 90 minutes OT Frequency: 5 out of 7 days OT Duration/Estimated Length of Stay: 14-16 days     Team Interventions: Nursing Interventions Patient/Family Education, Medication Management, Psychosocial Support, Bowel Management, Skin Care/Wound Management, Pain Management, Discharge Planning  PT interventions Ambulation/gait training, Discharge planning, DME/adaptive equipment instruction, Functional mobility training, Pain management, Psychosocial support, Splinting/orthotics, Therapeutic Activities, Visual/perceptual remediation/compensation, UE/LE Strength taining/ROM, Wheelchair  propulsion/positioning, Stair training, Therapeutic Exercise, UE/LE Coordination activities, Skin care/wound management, Patient/family education, Neuromuscular re-education, Functional electrical stimulation, Disease management/prevention, Firefighter, Warden/ranger  OT Interventions Warden/ranger, Discharge planning, Pain management, Self Care/advanced ADL retraining, Therapeutic Activities, Functional mobility training, Patient/family education, Therapeutic Exercise, Community reintegration, Fish farm manager, Neuromuscular re-education, UE/LE Strength taining/ROM, Wheelchair propulsion/positioning  SLP Interventions    TR Interventions    SW/CM Interventions Discharge Planning, Psychosocial Support, Patient/Family Education   Barriers to Discharge MD  Medical stability and Neurogenic bowel and bladder  Nursing Decreased caregiver support, Home environment access/layout, Wound Care, Weight, Weight bearing restrictions, Medication compliance home with spouse 2 level with 1/2 bath main level, B/B up flight of steps and entry has 1 step no rail  PT Home environment access/layout, Neurogenic Bowel & Bladder, Wound Care, Decreased caregiver support    OT      SLP      SW Decreased caregiver support, Lack of/limited family support, Community education officer for SNF coverage     Team Discharge Planning: Destination: PT-Home ,OT- Home , SLP-  Projected Follow-up: PT-Outpatient PT, OT-  Outpatient OT, SLP-  Projected Equipment Needs: PT-To be determined, OT- 3 in 1 bedside comode, To be determined, Tub/shower bench, SLP-  Equipment Details: PT-Likely RW, TBD standard vs custom w/c, OT-owns RW and SPC Patient/family involved in discharge planning: PT- Patient,  OT-Patient, SLP-   MD ELOS:  10-14 Medical Rehab Prognosis:  Excellent Assessment: The patient has been admitted for CIR therapies with the diagnosis of T7 thoracic spinal cord injury . The team  will be addressing functional mobility, strength, stamina, balance, safety, adaptive techniques and  equipment, self-care, bowel and bladder mgt, patient and caregiver education. Goals have been set at Mod I. Anticipated discharge destination is home.       See Team Conference Notes for weekly updates to the plan of care

## 2022-12-27 NOTE — Progress Notes (Addendum)
PROGRESS NOTE   Subjective/Complaints:  Pt slept poorly last night, woke up in the middle of the night. Would like getting melatonin as a PRN.  Pain currently controlled. LBM this AM. Urinating fine. Denies any other complaints or concerns today.   ROS: Patient denies fever, rash, sore throat, blurred vision, dizziness, nausea, vomiting, diarrhea, cough, shortness of breath or chest pain,  headache, or mood change.   Objective:   VAS [place] LOWER EXTREMITY VENOUS (DVT)  Result Date: 12/26/2022  Lower Venous DVT Study Patient Name:  Mario [name]  Date of Exam:   12/25/2022 Medical Rec #: [id number]        Accession #:    [id number] Date of Birth: 09-28-1980        Patient Gender: M Patient Age:   42 years Exam Location:  [facility] Procedure:      VAS [place] LOWER EXTREMITY VENOUS (DVT) Referring Phys: PAMELA LOVE --------------------------------------------------------------------------------  Indications: Edema. Other Indications: Rehab patient. Comparison Study: Previous exam (RLEV) on 12/19/2022 was negative for DVT. Performing Technologist: Ernestene Mention RVT, RDMS  Examination Guidelines: A complete evaluation includes B-mode imaging, spectral Doppler, color Doppler, and power Doppler as needed of all accessible portions of each vessel. Bilateral testing is considered an integral part of a complete examination. Limited examinations for reoccurring indications may be performed as noted. The reflux portion of the exam is performed with the patient in reverse Trendelenburg.  +---------+---------------+---------+-----------+----------+--------------+ RIGHT    CompressibilityPhasicitySpontaneityPropertiesThrombus Aging +---------+---------------+---------+-----------+----------+--------------+ CFV      Full           Yes      Yes                                 +---------+---------------+---------+-----------+----------+--------------+  SFJ      Full                                                        +---------+---------------+---------+-----------+----------+--------------+ FV Prox  Full           Yes      Yes                                 +---------+---------------+---------+-----------+----------+--------------+ FV Mid   Full           Yes      Yes                                 +---------+---------------+---------+-----------+----------+--------------+ FV DistalFull           Yes      Yes                                 +---------+---------------+---------+-----------+----------+--------------+ PFV      Full                                                        +---------+---------------+---------+-----------+----------+--------------+  POP      Full           Yes      Yes                                 +---------+---------------+---------+-----------+----------+--------------+ PTV      Full                                                        +---------+---------------+---------+-----------+----------+--------------+ PERO     Full                                                        +---------+---------------+---------+-----------+----------+--------------+   +---------+---------------+---------+-----------+----------+--------------+ LEFT     CompressibilityPhasicitySpontaneityPropertiesThrombus Aging +---------+---------------+---------+-----------+----------+--------------+ CFV      Full           Yes      Yes                                 +---------+---------------+---------+-----------+----------+--------------+ SFJ      Full                                                        +---------+---------------+---------+-----------+----------+--------------+ FV Prox  Full           Yes      Yes                                 +---------+---------------+---------+-----------+----------+--------------+ FV Mid   Full           Yes      Yes                                  +---------+---------------+---------+-----------+----------+--------------+ FV DistalFull           Yes      Yes                                 +---------+---------------+---------+-----------+----------+--------------+ PFV      Full                                                        +---------+---------------+---------+-----------+----------+--------------+ POP      Full           Yes      Yes                                 +---------+---------------+---------+-----------+----------+--------------+ PTV  Full                                                        +---------+---------------+---------+-----------+----------+--------------+ PERO     Full                                                        +---------+---------------+---------+-----------+----------+--------------+     Summary: BILATERAL: - No evidence of deep vein thrombosis seen in the lower extremities, bilaterally. -No evidence of popliteal cyst, bilaterally.   *See table(s) above for measurements and observations. Electronically signed by Gerarda Fraction on 12/26/2022 at 8:44:14 AM.    Final    Recent Labs    12/25/22 0550  WBC 11.0*  HGB 15.5  HCT 46.3  PLT 517*   Recent Labs    12/25/22 0550  NA 132*  K 4.4  CL 93*  CO2 27  GLUCOSE 137*  BUN 14  CREATININE 1.01  CALCIUM 10.3    Intake/Output Summary (Last 24 hours) at 12/27/2022 1107 Last data filed at 12/27/2022 0735 Gross per 24 hour  Intake 240 ml  Output 900 ml  Net -660 ml        Physical Exam: Vital Signs Blood pressure 123/72, pulse 83, temperature 97.8 F (36.6 C), resp. rate 20, height 6\' 1"  (1.854 m), weight 129.3 kg, SpO2 95%.  General: Alert and oriented x 3, No apparent distress, laying in bed HEENT: Head is normocephalic, atraumatic, PERRLA, EOMI, sclera anicteric, oral mucosa pink and moist, dentition intact, ext ear canals clear but with headphones in  Neck: Supple without JVD  or lymphadenopathy Heart: Reg rate and rhythm. No murmurs rubs or gallops Chest: CTA bilaterally without wheezes, rales, or rhonchi; no distress Abdomen: Soft, non-tender, non-distended, bowel sounds positive. Extremities: No clubbing, cyanosis, or edema. Pulses are 2+ Psych: Pt's affect is appropriate. Pt is cooperative Skin: Clean and intact without signs of breakdown. Back incision CDI--not reassessed today  PRIOR EXAMS: Neuro:  Alert and oriented x 3. Normal insight and awareness. Intact Memory. Normal language and speech. Cranial nerve exam unremarkable. MMT: UE motor 5/5. LLE 3- to 3/5 HF, KE and 1+ PF and tr-1 ADF. RLE: 3- to 3/5 HF, KE and 1 APF, tr ADF.  T8 sensory level. Can sense LT and pain in both legs, lesser on feet?, DTR's 1+, no resting tone today. No clonus appreciated Musculoskeletal: back in TLSO, tender with sitting.     Assessment/Plan: 1. Functional deficits which require 3+ hours per day of interdisciplinary therapy in a comprehensive inpatient rehab setting. Physiatrist is providing close team supervision and 24 hour management of active medical problems listed below. Physiatrist and rehab team continue to assess barriers to discharge/monitor patient progress toward functional and medical goals  Care Tool:  Bathing    Body parts bathed by patient: Right arm, Left arm, Chest, Abdomen, Front perineal area, Buttocks, Right upper leg, Left upper leg, Face   Body parts bathed by helper: Right lower leg, Left lower leg     Bathing assist Assist Level: Minimal Assistance - Patient > 75%     Upper Body Dressing/Undressing Upper body dressing   What is  the patient wearing?: Pull over shirt    Upper body assist Assist Level: Set up assist    Lower Body Dressing/Undressing Lower body dressing      What is the patient wearing?: Underwear/pull up, Pants     Lower body assist Assist for lower body dressing: Maximal Assistance - Patient 25 - 49%      Toileting Toileting    Toileting assist Assist for toileting: Maximal Assistance - Patient 25 - 49%     Transfers Chair/bed transfer  Transfers assist     Chair/bed transfer assist level: Moderate Assistance - Patient 50 - 74%     Locomotion Ambulation   Ambulation assist      Assist level: 2 helpers Assistive device: Walker-rolling Max distance: 42 ft   Walk 10 feet activity   Assist     Assist level: 2 helpers Assistive device: Walker-rolling   Walk 50 feet activity   Assist Walk 50 feet with 2 turns activity did not occur: Safety/medical concerns         Walk 150 feet activity   Assist Walk 150 feet activity did not occur: Safety/medical concerns         Walk 10 feet on uneven surface  activity   Assist Walk 10 feet on uneven surfaces activity did not occur: Safety/medical concerns         Wheelchair     Assist Is the patient using a wheelchair?: Yes Type of Wheelchair: Manual    Wheelchair assist level: Supervision/Verbal cueing Max wheelchair distance: 100    Wheelchair 50 feet with 2 turns activity    Assist        Assist Level: Supervision/Verbal cueing   Wheelchair 150 feet activity     Assist      Assist Level: Minimal Assistance - Patient > 75%   Blood pressure 123/72, pulse 83, temperature 97.8 F (36.6 C), resp. rate 20, height 6\' 1"  (1.854 m), weight 129.3 kg, SpO2 95%.  Medical Problem List and Plan: 1. Functional deficits secondary to T7 thoracic spinal cord injury             -patient may shower             -ELOS/Goals: modI 10-14 days             -Continue CIR therapies including PT, OT   -pt given grounds pass 2.  Antithrombotics: -DVT/anticoagulation:  DVT study neg 12/27/22; Mechanical: Sequential compression devices, below knee Bilateral lower extremities; now on lovenox 30mg  q12h              3. Pain Management:  PRN oxycodone seems to be helping to control pain with activity 4.  Mood/Behavior/Sleep: LCSW to follow for evaluation and support.              -antipsychotic agents: N/A             --trazodone prn for insomnia.   -12/27/22 added melatonin 5mg  at bedtime PRN to help with sleep 5. Neuropsych/cognition: This patient is capable of making decisions on his own behalf. 6. Skin/Wound Care: Routine pressure relief measures. 7. Fluids/Electrolytes/Nutrition: encourage PO  -mild hyponatremia--will recheck on Monday. No obvious causes at this time 8. HTN: Monitor BP TID--continue avapro 75mg  daily for diovan  -12/27/22 BPs doing good, cont regimen Vitals:   12/24/22 1228 12/24/22 1949 12/24/22 2018 12/25/22 0400  BP: 127/72 (!) 144/91 (!) 134/93 133/76   12/25/22 0600 12/25/22 1314 12/25/22 2034 12/26/22 0627  BP: Marland Kitchen)  153/83 130/77 (!) 140/71 (!) 142/84   12/26/22 1252 12/26/22 2025 12/27/22 0339  BP: 109/85 127/73 123/72    9. T2DM: Hgb A1c- Monitor BS ac/hs and use SSI for elevated BS             --continue metformin XR 500mg  daily  -12/27/22 CBG's controlled CBG (last 3)  Recent Labs    12/26/22 1638 12/26/22 2127 12/27/22 0608  GLUCAP 95 83 160*     10. Neurogenic bowel: - Enema administered with only small result-->KUB showed moderate to large stool burden.              --continue Senna S to 2 tabs bid -schedule miralax starting tomorrow -9/6 give sorbitol 60cc today, SSE if needed in pm  -12/27/22 LBM this morning, moving bowels better, cont regimen 11. Spasticity: continue baclofen 5mg  QID--this dose seems to be working for him without causing too much fatigue or weakness-- getting it nightly now, and TID PRN.     I spent >41mins performing patient care related activities, including face to face time, documentation time, discussion regarding sleep/bowel meds, med management, discussion of meds with patient and nursing staff, and overall coordination of care.   LOS: 3 days A FACE TO FACE EVALUATION WAS PERFORMED  [address] 12/27/2022, 11:07 AM

## 2022-12-28 DIAGNOSIS — M4714 Other spondylosis with myelopathy, thoracic region: Secondary | ICD-10-CM | POA: Diagnosis not present

## 2022-12-28 DIAGNOSIS — K5901 Slow transit constipation: Secondary | ICD-10-CM | POA: Diagnosis not present

## 2022-12-28 LAB — GLUCOSE, CAPILLARY
Glucose-Capillary: 115 mg/dL — ABNORMAL HIGH (ref 70–99)
Glucose-Capillary: 105 mg/dL — ABNORMAL HIGH (ref 70–99)
Glucose-Capillary: 106 mg/dL — ABNORMAL HIGH (ref 70–99)
Glucose-Capillary: 133 mg/dL — ABNORMAL HIGH (ref 70–99)

## 2022-12-28 NOTE — Progress Notes (Signed)
Occupational Therapy Session Note  Patient Details  Name: Mario [name] MRN: [id number] Date of Birth: 01/19/1981  Today's Date: 12/28/2022 OT Individual Time: 4401-0272 OT Individual Time Calculation (min): 72 min     Skilled Therapeutic Interventions/Progress Updates: Patient received in the bathroom. Patient agreeable to OT treatment working on self care skills. Assisted from toilet to shower bench with assist of the STEDY. Set up in the shower provided with reminders of back precautions. Patient aware he will need assist with LE's in the hospital and at home if he does not have a long handled sponge. Patient able to sit on shower bench and perform all UB bathing with set up assist. Assist for bathing below knees.  Patient educated on use of AE for dressing. Patient will benefit from reacher and long handled shoe horn for greater independence dressing EOB at home. Continued treatment with seated GH tasks at EOB. Patient making good progress and responding well to education. Continue with skilled OT POC to improve independence and safety when performing self care.      Therapy Documentation Precautions:  Precautions Precautions: Back, Fall Precaution Comments: reviewed back precautions Required Braces or Orthoses: Spinal Brace Spinal Brace: Thoracolumbosacral orthotic, Applied in sitting position, Other (comment) Spinal Brace Comments: no brace ordered, but pt prefers for comfort Restrictions Weight Bearing Restrictions: No General:   Vital Signs:   Pain: Pain Assessment Pain Scale: 0-10 Pain Score: 9  ADL: ADL Eating: Independent Grooming: Supervision/safety Where Assessed-Grooming: Edge of bed Upper Body Bathing:Set up assistance Lower Body Bathing: Moderate assistance Upper Body Dressing: Supervision/safety Lower Body Dressing: Maximal assistance Toileting: Moderate assistance Where Assessed-Toileting: Bedside Commode Toilet Transfer: Moderate assistance Toilet  Transfer Method: Stand pivot Toilet Transfer Equipment: Drop arm bedside commode Tub/Shower Transfer: Moderate assistance TTub/Shower Equipment: Transfer tub bench    Therapy/Group: Individual Therapy  Warnell Forester 12/28/2022, 12:51 PM

## 2022-12-28 NOTE — Plan of Care (Signed)
  Problem: Consults Goal: RH GENERAL PATIENT EDUCATION Description: See Patient Education module for education specifics. Outcome: Progressing   Problem: RH BOWEL ELIMINATION Goal: RH STG MANAGE BOWEL WITH ASSISTANCE Description: STG Manage Bowel with supervision Assistance. Outcome: Progressing Goal: RH STG MANAGE BOWEL W/MEDICATION W/ASSISTANCE Description: STG Manage Bowel with Medication with min Assistance. Outcome: Progressing

## 2022-12-28 NOTE — Progress Notes (Signed)
Physical Therapy Session Note  Patient Details  Name: Mario [name] MRN: [id number] Date of Birth: 1980/05/16  Today's Date: 12/28/2022 PT Individual Time: 1105-1205 and 1420-1535 PT Individual Time Calculation (min): 60 min and 75 min   Short Term Goals: Week 1:  PT Short Term Goal 1 (Week 1): Pt will ambulate >50 ft with assist PT Short Term Goal 2 (Week 1): Pt will progress to consistent min A STS and intemittent CGA PT Short Term Goal 3 (Week 1): Pt will self propel w/c throughout whole session.  Skilled Therapeutic Interventions/Progress Updates: Tx1: Pt presented in bed agreeable to therapy. Pt noted unrated pain but states received pain meds prior to session. Pt completed bed mobility with CGA and use of bed features. PTA conned shoes total A for time management. Bed elevated and pt completed Sit to stand with minA and stand pivot to w/c with minA. Pt propelled w/c with supervision to main gym and completed squat pivot transfer to high/low mat with CGA. Completed Sit to stand with minA x 5. Cues for hand placement (not placing hand on crossbar of RW) and improving anterior weight shifting during transitional movement. Pt then worked on alternating toe taps x 8 then x 5 with seated rest break between bouts. Pt able to complete with CGA however required increased effort to accurately place foot on step. Pt then completed standing tolerance task of placing horseshoes on basketball net with pt completing min to moderate reaches. Prior to transfer back to w/c PTA obtained 20x18 w/c. Pt then completed stand pivot transfer with minA and increased time noting heavy reliance on BUE. Pt the propelled back to room in same manner as prior. Pt transferred back to EOB via stand pivot and minA overall. Pt required modA to complete transfer sit to supine for BLE management. Pt was able to pull self to [facility] using headboard. Pt left resting in bed at end of session with bed alarm on, call bell within reach and  needs met.   Tx2: Pt presented in bed with family present agreeable to therapy. Pt with increasing pain with activity during session up to 7/10, nsg notified and pain meds received during session. Session focused on standing tolerance and ambulation. Pt completed bed mobility with CGA and use of bed features. PTA donned shoes total A and pt competed stand step transfer to w/c with RW and light min nearing CGA. Pt propelled with supervision to day room for general conditioning. Pt then set up at [employer] and completed x 2 min for reciprocal movement as "warm up" prior to ambulation. Pt then ambulated 67ft with w/c follow for safety with minA. Pt noted to have more of a steppage gait on RLE. PTA obtained foot up brace however despite being XL was too small for pt's foot. Therefore PTA applied ace bandage to R foot and pt ambulated an additonal 29ft with more symmetrical gait. Pt then worked on standing balance/tolerance activity playing x 2 rounds of corn hole. On first bout pt only used RUE however on last four throws pt maintained RUE off RW, therefore on second bout pt alternated hands with each throw maintaining single UE support throughout activity. PTA then elevated mat to have pt perform Sit to stand pushing from mat with BUE and having PTA facilitate anterior weight shifting to increase posterior chain ms recruitment. Pt then transferred to w/c via stand step transfer with minA. Pt propelled back to room in same manner as prior to in room completed stand  step transfer with minA overall. Pt returned to supine with modA for BLE management. Pt left resting in bed at end of session with bed alarm on, call bell within reach and needs met.      Therapy Documentation Precautions:  Precautions Precautions: Back, Fall Precaution Comments: reviewed back precautions Required Braces or Orthoses: Spinal Brace Spinal Brace: Thoracolumbosacral orthotic, Applied in sitting position, Other (comment) Spinal  Brace Comments: no brace ordered, but pt prefers for comfort Restrictions Weight Bearing Restrictions: No     Therapy/Group: Individual Therapy  Theresea Trautmann 12/28/2022, 12:48 PM

## 2022-12-28 NOTE — Progress Notes (Signed)
PROGRESS NOTE   Subjective/Complaints:  Pt doing alright, slept well last night.  Pain currently controlled. LBM was last night. Urinating fine. Denies any other complaints or concerns today.   ROS: Patient denies fever, rash, sore throat, blurred vision, dizziness, nausea, vomiting, diarrhea, cough, shortness of breath or chest pain,  headache, or mood change.   Objective:   No results found. No results for input(s): "WBC", "HGB", "HCT", "PLT" in the last 72 hours.  No results for input(s): "NA", "K", "CL", "CO2", "GLUCOSE", "BUN", "CREATININE", "CALCIUM" in the last 72 hours.   Intake/Output Summary (Last 24 hours) at 12/28/2022 0954 Last data filed at 12/28/2022 0618 Gross per 24 hour  Intake 476 ml  Output 1275 ml  Net -799 ml        Physical Exam: Vital Signs Blood pressure (!) 149/78, pulse 70, temperature 97.8 F (36.6 C), temperature source Oral, resp. rate 18, height 6\' 1"  (1.854 m), weight 129.3 kg, SpO2 97%.  General: Alert and oriented x 3, No apparent distress, laying in bed HEENT: Head is normocephalic, atraumatic, PERRLA, EOMI, sclera anicteric, oral mucosa pink and moist, dentition intact, ext ear canals clear Neck: Supple without JVD or lymphadenopathy Heart: Reg rate and rhythm. No murmurs rubs or gallops Chest: CTA bilaterally without wheezes, rales, or rhonchi; no distress Abdomen: Soft, non-tender, non-distended, bowel sounds positive. Extremities: No clubbing, cyanosis, or edema. Pulses are 2+ Psych: Pt's affect is appropriate. Pt is cooperative Skin: Clean and intact without signs of breakdown. Back incision CDI--not reassessed today  PRIOR EXAMS: Neuro:  Alert and oriented x 3. Normal insight and awareness. Intact Memory. Normal language and speech. Cranial nerve exam unremarkable. MMT: UE motor 5/5. LLE 3- to 3/5 HF, KE and 1+ PF and tr-1 ADF. RLE: 3- to 3/5 HF, KE and 1 APF, tr ADF.  T8 sensory  level. Can sense LT and pain in both legs, lesser on feet?, DTR's 1+, no resting tone today. No clonus appreciated Musculoskeletal: back in TLSO, tender with sitting.     Assessment/Plan: 1. Functional deficits which require 3+ hours per day of interdisciplinary therapy in a comprehensive inpatient rehab setting. Physiatrist is providing close team supervision and 24 hour management of active medical problems listed below. Physiatrist and rehab team continue to assess barriers to discharge/monitor patient progress toward functional and medical goals  Care Tool:  Bathing    Body parts bathed by patient: Right arm, Face, Left arm, Chest, Abdomen, Right upper leg, Left upper leg, Front perineal area   Body parts bathed by helper: Buttocks, Right lower leg, Left lower leg     Bathing assist Assist Level: Minimal Assistance - Patient > 75%     Upper Body Dressing/Undressing Upper body dressing   What is the patient wearing?: Pull over shirt    Upper body assist Assist Level: Set up assist    Lower Body Dressing/Undressing Lower body dressing      What is the patient wearing?: Underwear/pull up, Pants     Lower body assist Assist for lower body dressing: Maximal Assistance - Patient 25 - 49%     Toileting Toileting    Toileting assist Assist for toileting: Moderate  Assistance - Patient 50 - 74%     Transfers Chair/bed transfer  Transfers assist     Chair/bed transfer assist level: Moderate Assistance - Patient 50 - 74%     Locomotion Ambulation   Ambulation assist      Assist level: 2 helpers Assistive device: Walker-rolling Max distance: 42 ft   Walk 10 feet activity   Assist     Assist level: 2 helpers Assistive device: Walker-rolling   Walk 50 feet activity   Assist Walk 50 feet with 2 turns activity did not occur: Safety/medical concerns         Walk 150 feet activity   Assist Walk 150 feet activity did not occur: Safety/medical  concerns         Walk 10 feet on uneven surface  activity   Assist Walk 10 feet on uneven surfaces activity did not occur: Safety/medical concerns         Wheelchair     Assist Is the patient using a wheelchair?: Yes Type of Wheelchair: Manual    Wheelchair assist level: Supervision/Verbal cueing Max wheelchair distance: 100    Wheelchair 50 feet with 2 turns activity    Assist        Assist Level: Supervision/Verbal cueing   Wheelchair 150 feet activity     Assist      Assist Level: Minimal Assistance - Patient > 75%   Blood pressure (!) 149/78, pulse 70, temperature 97.8 F (36.6 C), temperature source Oral, resp. rate 18, height 6\' 1"  (1.854 m), weight 129.3 kg, SpO2 97%.  Medical Problem List and Plan: 1. Functional deficits secondary to T7 thoracic spinal cord injury             -patient may shower             -ELOS/Goals: modI 10-14 days             -Continue CIR therapies including PT, OT   -pt given grounds pass 2.  Antithrombotics: -DVT/anticoagulation:  DVT study neg 12/27/22; Mechanical: Sequential compression devices, below knee Bilateral lower extremities; now on lovenox 30mg  q12h              3. Pain Management:  PRN oxycodone seems to be helping to control pain with activity 4. Mood/Behavior/Sleep: LCSW to follow for evaluation and support.              -antipsychotic agents: N/A             --trazodone prn for insomnia.   -12/27/22 added melatonin 5mg  QHS PRN to help with sleep-improved 5. Neuropsych/cognition: This patient is capable of making decisions on his own behalf. 6. Skin/Wound Care: Routine pressure relief measures. 7. Fluids/Electrolytes/Nutrition: encourage PO  -mild hyponatremia--will recheck on Monday. No obvious causes at this time 8. HTN: Monitor BP TID--continue avapro 75mg  daily for diovan  -9/7-8/24 BPs doing good, cont regimen Vitals:   12/24/22 2018 12/25/22 0400 12/25/22 0600 12/25/22 1314  BP: (!) 134/93  133/76 (!) 153/83 130/77   12/25/22 2034 12/26/22 0627 12/26/22 1252 12/26/22 2025  BP: (!) 140/71 (!) 142/84 109/85 127/73   12/27/22 0339 12/27/22 1343 12/27/22 1953 12/28/22 0334  BP: 123/72 127/77 136/80 (!) 149/78    9. T2DM: Hgb A1c- Monitor BS ac/hs and use SSI for elevated BS             --continue metformin XR 500mg  daily  -9/7-8/24 CBG's controlled CBG (last 3)  Recent Labs    12/27/22 1705  12/27/22 2111 12/28/22 0616  GLUCAP 100* 117* 115*     10. Neurogenic bowel: - Enema administered with only small result-->KUB showed moderate to large stool burden.              --continue Senna S to 2 tabs bid -schedule miralax starting tomorrow -9/6 give sorbitol 60cc today, SSE if needed in pm  -12/28/22 LBM last night, moving bowels better, cont regimen 11. Spasticity: continue baclofen 5mg  QID--this dose seems to be working for him without causing too much fatigue or weakness-- getting it nightly now, and TID PRN.     LOS: 4 days A FACE TO FACE EVALUATION WAS PERFORMED  [address] 12/28/2022, 9:54 AM

## 2022-12-28 NOTE — Plan of Care (Signed)
  Problem: RH SKIN INTEGRITY Goal: RH STG MAINTAIN SKIN INTEGRITY WITH ASSISTANCE Description: STG Maintain Skin Integrity With supervision Assistance. Outcome: Progressing   Problem: RH SAFETY Goal: RH STG ADHERE TO SAFETY PRECAUTIONS W/ASSISTANCE/DEVICE Description: STG Adhere to Safety Precautions With cueing Assistance/Device. Outcome: Progressing   Problem: RH PAIN MANAGEMENT Goal: RH STG PAIN MANAGED AT OR BELOW PT'S PAIN GOAL Description: Pain will be managed less than 4 on pain scale with PRN medications min assist  Outcome: Progressing

## 2022-12-29 DIAGNOSIS — G8222 Paraplegia, incomplete: Secondary | ICD-10-CM | POA: Diagnosis present

## 2022-12-29 LAB — GLUCOSE, CAPILLARY
Glucose-Capillary: 100 mg/dL — ABNORMAL HIGH (ref 70–99)
Glucose-Capillary: 83 mg/dL (ref 70–99)
Glucose-Capillary: 98 mg/dL (ref 70–99)
Glucose-Capillary: 131 mg/dL — ABNORMAL HIGH (ref 70–99)

## 2022-12-29 LAB — CBC
HCT: 41.5 % (ref 39.0–52.0)
Hemoglobin: 13.8 g/dL (ref 13.0–17.0)
MCH: 29.6 pg (ref 26.0–34.0)
MCHC: 33.3 g/dL (ref 30.0–36.0)
MCV: 88.9 fL (ref 80.0–100.0)
Platelets: 462 10*3/uL — ABNORMAL HIGH (ref 150–400)
RBC: 4.67 MIL/uL (ref 4.22–5.81)
RDW: 12.9 % (ref 11.5–15.5)
WBC: 8.1 10*3/uL (ref 4.0–10.5)
nRBC: 0 % (ref 0.0–0.2)

## 2022-12-29 LAB — BASIC METABOLIC PANEL
Anion gap: 9 (ref 5–15)
BUN: 13 mg/dL (ref 6–20)
CO2: 26 mmol/L (ref 22–32)
Calcium: 9.5 mg/dL (ref 8.9–10.3)
Chloride: 101 mmol/L (ref 98–111)
Creatinine, Ser: 0.99 mg/dL (ref 0.61–1.24)
GFR, Estimated: >60 mL/min (ref >60)
Glucose, Bld: 120 mg/dL — ABNORMAL HIGH (ref 70–99)
Potassium: 4 mmol/L (ref 3.5–5.1)
Sodium: 136 mmol/L (ref 135–145)

## 2022-12-29 MED ORDER — BACLOFEN 5 MG HALF TABLET
5.0000 mg | ORAL_TABLET | Freq: Four times a day (QID) | ORAL | Status: DC
  Administered 2022-12-29 – 2023-01-07 (×36): 5 mg via ORAL
  Filled 2022-12-29 (×36): qty 1

## 2022-12-29 MED ORDER — BACLOFEN 10 MG PO TABS
10.0000 mg | ORAL_TABLET | Freq: Once | ORAL | Status: AC
  Administered 2022-12-30: 10 mg via ORAL
  Filled 2022-12-29: qty 1

## 2022-12-29 NOTE — Progress Notes (Signed)
Physical Therapy Session Note  Patient Details  Name: Mario [name] MRN: [id number] Date of Birth: [birth year]  Today's Date: 12/29/2022 PT Individual Time: 0845-1000 PT Individual Time Calculation (min): 75 min   Short Term Goals: Week 1:  PT Short Term Goal 1 (Week 1): Pt will ambulate >50 ft with assist PT Short Term Goal 2 (Week 1): Pt will progress to consistent min A STS and intemittent CGA PT Short Term Goal 3 (Week 1): Pt will self propel w/c throughout whole session.  Skilled Therapeutic Interventions/Progress Updates:    Pt seated in w/c on arrival and agreeable to therapy. Pt reports pain managed this am on arrival but up to 7/10 with mobility.  Retrieved and installed R foot up brace with good success, pt demos improved DF and foot clearance during gait. Pt ambulated 2 x 30 with RW and min A and then 2 x 64 with RW and min A, with the addition of 1.5 lb ankle weights for improved proprioception. Pt reports sensation of increased control and demoes increased gait speed and fluidity. Note increase in spasticity affecting gait fluidity and speed, pt reports he has already discussed with MD.  Attempted stairs, but pt too fatigued to safely  navigate at this time. Pt then used nustep at level 5 for ROM and global LE strength x 8 min. Min a Stand pivot transfer with RW on/off nustep. Pt returned to room and to bed with mod A for BLE management, requested pain meds at end of session, was left with all needs in reach and alarm active.   Therapy Documentation Precautions:  Precautions Precautions: Back, Fall Precaution Comments: reviewed back precautions Required Braces or Orthoses: Spinal Brace Spinal Brace: Thoracolumbosacral orthotic, Applied in sitting position, Other (comment) Spinal Brace Comments: no brace ordered, but pt prefers for comfort Restrictions Weight Bearing Restrictions: No General:       Therapy/Group: Individual Therapy  [name] 12/29/2022, 9:27  AM

## 2022-12-29 NOTE — Progress Notes (Signed)
PROGRESS NOTE   Subjective/Complaints: Pt reports bowels doing "OK" LBM yesterday AM.  Spasms pretty out of control- feels like it was pulling to chest last night- but scared of baclofen higher dose because felt too weak in legs when used 10 mg.   All right with scheduling 5 mg-  Had a lot of pain last night and Oxy didn't work as wlel- but I think it's due to spasms being partial cause of pain- we discussed this.  ROS:  Pt denies SOB, abd pain, CP, N/V/C/D, and vision changes  Objective:   No results found. Recent Labs    12/29/22 0706  WBC 8.1  HGB 13.8  HCT 41.5  PLT 462*    Recent Labs    12/29/22 0706  NA 136  K 4.0  CL 101  CO2 26  GLUCOSE 120*  BUN 13  CREATININE 0.99  CALCIUM 9.5     Intake/Output Summary (Last 24 hours) at 12/29/2022 0902 Last data filed at 12/29/2022 0740 Gross per 24 hour  Intake 598 ml  Output 1650 ml  Net -1052 ml        Physical Exam: Vital Signs Blood pressure 130/75, pulse 78, temperature 97.7 F (36.5 C), temperature source Oral, resp. rate 18, height 6\' 1"  (1.854 m), weight 129.3 kg, SpO2 97%.   General: awake, alert, appropriate, sitting up slightly in bed; OT in room; eating breakfast; NAD HENT: conjugate gaze; oropharynx moist CV: regular rate and rhythm; no JVD Pulmonary: CTA B/L; no W/R/R- good air movement GI: soft, NT, ND, (+)BS- normoactive Psychiatric: appropriate Neurological: Ox3 Spasms in legs with very slight ROM- pt described pulling to chest, but saw pulling up at knee B/L  Psych: Pt's affect is appropriate. Pt is cooperative Skin: Clean and intact without signs of breakdown. Back incision CDI--not reassessed today  PRIOR EXAMS: Neuro:  Alert and oriented x 3. Normal insight and awareness. Intact Memory. Normal language and speech. Cranial nerve exam unremarkable. MMT: UE motor 5/5. LLE 3- to 3/5 HF, KE and 1+ PF and tr-1 ADF. RLE: 3- to 3/5 HF, KE  and 1 APF, tr ADF.  T8 sensory level. Can sense LT and pain in both legs, lesser on feet?, DTR's 1+, no resting tone today. No clonus appreciated Musculoskeletal: back in TLSO, tender with sitting.     Assessment/Plan: 1. Functional deficits which require 3+ hours per day of interdisciplinary therapy in a comprehensive inpatient rehab setting. Physiatrist is providing close team supervision and 24 hour management of active medical problems listed below. Physiatrist and rehab team continue to assess barriers to discharge/monitor patient progress toward functional and medical goals  Care Tool:  Bathing    Body parts bathed by patient: Right arm, Face, Left arm, Chest, Abdomen, Right upper leg, Left upper leg, Front perineal area   Body parts bathed by helper: Right lower leg, Left lower leg     Bathing assist Assist Level: Minimal Assistance - Patient > 75%     Upper Body Dressing/Undressing Upper body dressing   What is the patient wearing?: Pull over shirt    Upper body assist Assist Level: Set up assist    Lower Body Dressing/Undressing  Lower body dressing      What is the patient wearing?: Underwear/pull up, Pants     Lower body assist Assist for lower body dressing: Minimal Assistance - Patient > 75%     Toileting Toileting    Toileting assist Assist for toileting: Moderate Assistance - Patient 50 - 74%     Transfers Chair/bed transfer  Transfers assist     Chair/bed transfer assist level: Moderate Assistance - Patient 50 - 74%     Locomotion Ambulation   Ambulation assist      Assist level: 2 helpers Assistive device: Walker-rolling Max distance: 42 ft   Walk 10 feet activity   Assist     Assist level: 2 helpers Assistive device: Walker-rolling   Walk 50 feet activity   Assist Walk 50 feet with 2 turns activity did not occur: Safety/medical concerns         Walk 150 feet activity   Assist Walk 150 feet activity did not occur:  Safety/medical concerns         Walk 10 feet on uneven surface  activity   Assist Walk 10 feet on uneven surfaces activity did not occur: Safety/medical concerns         Wheelchair     Assist Is the patient using a wheelchair?: Yes Type of Wheelchair: Manual    Wheelchair assist level: Supervision/Verbal cueing Max wheelchair distance: 100    Wheelchair 50 feet with 2 turns activity    Assist        Assist Level: Supervision/Verbal cueing   Wheelchair 150 feet activity     Assist      Assist Level: Minimal Assistance - Patient > 75%   Blood pressure 130/75, pulse 78, temperature 97.7 F (36.5 C), temperature source Oral, resp. rate 18, height 6\' 1"  (1.854 m), weight 129.3 kg, SpO2 97%.  Medical Problem List and Plan: 1. Functional deficits secondary to T7 thoracic spinal cord injury             -patient may shower             -ELOS/Goals: modI 10-14 days             -Con't CIR PT and OT   -pt given grounds pass 2.  Antithrombotics: -DVT/anticoagulation:  DVT study neg 12/27/22; Mechanical: Sequential compression devices, below knee Bilateral lower extremities; now on lovenox 30mg  q12h              3. Pain Management:  PRN oxycodone seems to be helping to control pain with activity 4. Mood/Behavior/Sleep: LCSW to follow for evaluation and support.              -antipsychotic agents: N/A             --trazodone prn for insomnia.   -12/27/22 added melatonin 5mg  QHS PRN to help with sleep-improved 5. Neuropsych/cognition: This patient is capable of making decisions on his own behalf. 6. Skin/Wound Care: Routine pressure relief measures. 7. Fluids/Electrolytes/Nutrition: encourage PO  -mild hyponatremia--will recheck on Monday. No obvious causes at this time  9/9- Na 136 this AM 8. HTN: Monitor BP TID--continue avapro 75mg  daily for diovan  -9/7-8/24 BPs doing good, cont regimen  9/9- BP's a little on high side- 120s-140s systolic- wil monitor for  trend- make sure, although unlikely, that's it's not AD. Vitals:   12/25/22 1314 12/25/22 2034 12/26/22 0627 12/26/22 1252  BP: 130/77 (!) 140/71 (!) 142/84 109/85   12/26/22 2025 12/27/22 0339 12/27/22 1343  12/27/22 1953  BP: 127/73 123/72 127/77 136/80   12/28/22 0334 12/28/22 1500 12/28/22 2005 12/29/22 0550  BP: (!) 149/78 137/77 (!) 145/81 130/75    9. T2DM: Hgb A1c 6.7- - Monitor BS ac/hs and use SSI for elevated BS             --continue metformin XR 500mg  daily  -9/7-8/24 CBG's controlled  9/9- CBGs controlled- con't regimen CBG (last 3)  Recent Labs    12/28/22 1620 12/28/22 2038 12/29/22 0547  GLUCAP 106* 133* 100*     10. Neurogenic bowel: - Enema administered with only small result-->KUB showed moderate to large stool burden.              --continue Senna S to 2 tabs bid -schedule miralax starting tomorrow -9/6 give sorbitol 60cc today, SSE if needed in pm  -12/28/22 LBM last night, moving bowels better, cont regimen 9/9- LBM 1-2 days ago, however getting to bathroom this AM to have BM with OT 11. Spasticity: continue baclofen 5mg  QID--this dose seems to be working for him without causing too much fatigue or weakness-- getting it nightly now, and TID PRN.    9/9- pt reports that was getting 10 mg QID, however felt too weak- ok with scheduling Baclofen since spasms out of control on prn/at bedtime- changed to 5 mg QID   I spent a total of 51   minutes on total care today- >50% coordination of care- due to  D/w OT, nursing at length and pt about spasms and pain and how they interact.  Also review of chart, admission, daily notes, labs and vitals.    LOS: 5 days A FACE TO FACE EVALUATION WAS PERFORMED  Anikah Hogge 12/29/2022, 9:02 AM

## 2022-12-29 NOTE — Progress Notes (Signed)
Physical Therapy Session Note  Patient Details  Name: Mario [name] MRN: [id number] Date of Birth: 1981-04-17  Today's Date: 12/29/2022 PT Individual Time: 1415-1530 PT Individual Time Calculation (min): 75 min   Short Term Goals: Week 1:  PT Short Term Goal 1 (Week 1): Pt will ambulate >50 ft with assist PT Short Term Goal 2 (Week 1): Pt will progress to consistent min A STS and intemittent CGA PT Short Term Goal 3 (Week 1): Pt will self propel w/c throughout whole session.  Skilled Therapeutic Interventions/Progress Updates:    pt received in bed and agreeable to therapy. Pt reports pain largely controlled on medication, but up to 8/10 when standing/sitting.  Bed mobility with supervision and min A Stand pivot transfer to w/c, was left with all needs in reach and alarm active.   Session focused on lite gait training for endurance and gait mechanics. Added slight incline to encourage increased hip flexion.   40 ft  60 ft 55 ft with incline level 2  127ft with level 2 incline  Note slight circumduction gait on RLE, which pt is aware of and intermittently able to correct. Pt also with some hyperextension,~50% of steps but not extreme and able to correct at times.  Pt returned to room and engaged in discussion about d/c expectations and ELOS in preparation for conference tomorrow. Pt remained in w/c and was left with all needs in reach and alarm active.   Therapy Documentation Precautions:  Precautions Precautions: Back, Fall Precaution Comments: reviewed back precautions Required Braces or Orthoses: Spinal Brace Spinal Brace: Thoracolumbosacral orthotic, Applied in sitting position, Other (comment) Spinal Brace Comments: no brace ordered, but pt prefers for comfort Restrictions Weight Bearing Restrictions: No General:       Therapy/Group: Individual Therapy  [name] 12/29/2022, 2:49 PM

## 2022-12-29 NOTE — Progress Notes (Signed)
Occupational Therapy Session Note  Patient Details  Name: Mario [name] MRN: [id number] Date of Birth: 08/23/1980  Today's Date: 12/29/2022 OT Individual Time: 4098-1191 OT Individual Time Calculation (min): 73 min    Short Term Goals: Week 1:  OT Short Term Goal 1 (Week 1): Pt will stand while grooming for >2:00 with AE as necessary with Min A OT Short Term Goal 2 (Week 1): Pt will complete bathing at overall Mod A with AE as necessary  Skilled Therapeutic Interventions/Progress Updates:    Pt resting in bed upon arrival and requesting to use toilet. Supine>sit EOB with supervision using bed rails with HOB elevated. Sit>stand from EOB with CGA and stand step transfer to w/c with CGA. Toilet transfer with min A. Toileting with mod A. Pt returned to w/c and completed bathing/dressing with sit<>stand from w/c at sink. AE provided to assist with LB dressing. Will need wide sock aide. Standing balance at sink to bathe buttocks with close supervision. Pt remained in w/c with all needs within reach. Belt alarm activated.   Therapy Documentation Precautions:  Precautions Precautions: Back, Fall Precaution Comments: reviewed back precautions Required Braces or Orthoses: Spinal Brace Spinal Brace: Thoracolumbosacral orthotic, Applied in sitting position, Other (comment) Spinal Brace Comments: no brace ordered, but pt prefers for comfort Restrictions Weight Bearing Restrictions: No Pain:  Pt reports pain in better but having more spasms during the night; MD aware  Therapy/Group: Individual Therapy  [name] 12/29/2022, 8:20 AM

## 2022-12-29 NOTE — Progress Notes (Signed)
Patient having leg spasms at night which he states is new and he usually has them in the morning. Baclofen was not effective with the stronger spasms.

## 2022-12-30 DIAGNOSIS — G8222 Paraplegia, incomplete: Secondary | ICD-10-CM | POA: Diagnosis not present

## 2022-12-30 LAB — GLUCOSE, CAPILLARY
Glucose-Capillary: 125 mg/dL — ABNORMAL HIGH (ref 70–99)
Glucose-Capillary: 111 mg/dL — ABNORMAL HIGH (ref 70–99)
Glucose-Capillary: 99 mg/dL (ref 70–99)
Glucose-Capillary: 104 mg/dL — ABNORMAL HIGH (ref 70–99)

## 2022-12-30 MED ORDER — TIZANIDINE HCL 2 MG PO TABS
2.0000 mg | ORAL_TABLET | Freq: Every day | ORAL | Status: DC
  Administered 2022-12-30 – 2023-01-08 (×10): 2 mg via ORAL
  Filled 2022-12-30 (×10): qty 1

## 2022-12-30 MED ORDER — TIZANIDINE HCL 2 MG PO TABS
2.0000 mg | ORAL_TABLET | Freq: Three times a day (TID) | ORAL | Status: DC
  Administered 2022-12-30 – 2023-01-09 (×30): 2 mg via ORAL
  Filled 2022-12-30 (×31): qty 1

## 2022-12-30 NOTE — Patient Care Conference (Signed)
Inpatient RehabilitationTeam Conference and Plan of Care Update Date: 12/30/2022   Time: 11:10 AM    Patient Name: Mario [name]      Medical Record Number: [id number]  Date of Birth: 1980-06-11 Sex: Male         Room/Bed: 4M05C/4M05C-01 Payor Info: Payor: BLUE CROSS BLUE SHIELD / Plan: BCBS COMM PPO / Product Type: *No Product type* /    Admit Date/Time:  12/24/2022 12:09 PM  Primary Diagnosis:  Acute incomplete paraplegia [facility])  Hospital Problems: Principal Problem:   Acute incomplete paraplegia [facility]) Active Problems:   Thoracic myelopathy    Expected Discharge Date: Expected Discharge Date: 01/09/23  Team Members Present: Physician leading conference: Dr. Genice Rouge Social Worker Present: Cecile Sheerer, LCSWA Nurse Present: Vedia Pereyra, RN PT Present: Bernie Covey, PT OT Present: Roney Mans, OT PPS Coordinator present : Fae Pippin, SLP     Current Status/Progress Goal Weekly Team Focus  Bowel/Bladder   patient is contient of b/b   maintain continence   offer toileting qshift and PRN    Swallow/Nutrition/ Hydration               ADL's   min A overall for ADLs; transfers min to mod A   supervision overall   will need BSC and need fam education next week    Mobility   min A gait with RW with foot up brace, has been too fatigued to attempt stair training at this time, noted increased tone on 9/9   supervision overall  stair training, BLE NMR and strengthening, gait mechanics,    Communication                Safety/Cognition/ Behavioral Observations               Pain   patient c/o pain from back surgery and leg spasms at times 9/10   lessen leg spasms to tolerable and pain to <3 on 0-10 scale   assess pain qshift and PRN    Skin   skin is intact other than surgical incision   maintain skin integrity and infection free incision  assess skin q shift and PRN for s/s of infection or breakdown      Discharge Planning:  Pt  will discharge to home with his wife who will provide 24/7 care and PRN support from sons. SW will confirm there are no barriers to discharge.   Team Discussion: Acute incomplete paraplegia. Time toileting. Uncontrolled spasms. Baclofen scheduled. Incision to back with steri-strips that is open to air. Tolerating carb-mod diet. AC/HS. Progressing well with therapy. Using right foot-up brace. Unable to do stairs at this time.  Has steps at home.  Patient on target to meet rehab goals: Progressing towards goals with discharge date of 01/09/23  *See Care Plan and progress notes for long and short-term goals.   Revisions to Treatment Plan:  Zanaflex increased.  AFO consult. Monitor labs/VS Teaching Needs: Medications, safety, self care, gait/transfer training, skin care, etc.   Current Barriers to Discharge: Decreased caregiver support, Home enviroment access/layout, and New diabetic  Possible Resolutions to Barriers: Family education Able to manage flight of stairs safely Order recommended DME     Medical Summary Current Status: spasticity major issue- gettign worse- LBM yesterday- continent B/B- spasms "10/10" incision of back- steristrips- looks great  Barriers to Discharge: Spasticity;Behavior/Mood;Medical stability;Neurogenic Bowel & Bladder;Uncontrolled Pain  Barriers to Discharge Comments: spasticity, pain from inicsion and spasms-R foot drop- 3/5-  got foot up brace R  AFO - needs AFO consult Possible Resolutions to Barriers/Weekly Focus: AFO consult-  adding Zanaflex and scheduled baclofen-  9/20- d/c date   Continued Need for Acute Rehabilitation Level of Care: The patient requires daily medical management by a physician with specialized training in physical medicine and rehabilitation for the following reasons: Direction of a multidisciplinary physical rehabilitation program to maximize functional independence : Yes Medical management of patient stability for increased activity  during participation in an intensive rehabilitation regime.: Yes Analysis of laboratory values and/or radiology reports with any subsequent need for medication adjustment and/or medical intervention. : Yes   I attest that I was present, lead the team conference, and concur with the assessment and plan of the team.   Jearld Adjutant 12/30/2022, 6:28 PM

## 2022-12-30 NOTE — Progress Notes (Signed)
Patient ID: Mario [name], male   DOB: 1981-03-19, 42 y.o.   MRN: [id number]  SW met with pt in room to provide updates from team conference, and d/c date 9/20. SW discussed 3in1 BSC needed. Pt intends to speak with his wife about it. Pt aware SW will follow-up with SW.   1311- SW spoke with pt wife to discuss above. Discussed family edu. Scheduled for Wed (9/18) 9am-12pm.    Cecile Sheerer, MSW, LCSWA Office: [phone] Cell: [phone] Fax: [phone]

## 2022-12-30 NOTE — Progress Notes (Signed)
PROGRESS NOTE   Subjective/Complaints:  Pt reports spasms MUCH worse last night- and late afternoon- denies feeling ill or getting sick.   Worst night he's had- spiked intensity LBM yesterday  Also feels like "skin won't stretch".     ROS:   Pt denies SOB, abd pain, CP, N/V/C/D, and vision changes  Except for HPI  Objective:   No results found. Recent Labs    12/29/22 0706  WBC 8.1  HGB 13.8  HCT 41.5  PLT 462*    Recent Labs    12/29/22 0706  NA 136  K 4.0  CL 101  CO2 26  GLUCOSE 120*  BUN 13  CREATININE 0.99  CALCIUM 9.5     Intake/Output Summary (Last 24 hours) at 12/30/2022 0819 Last data filed at 12/30/2022 0526 Gross per 24 hour  Intake 474 ml  Output 1250 ml  Net -776 ml        Physical Exam: Vital Signs Blood pressure 125/86, pulse 72, temperature 98.4 F (36.9 C), temperature source Oral, resp. rate 18, height 6\' 1"  (1.854 m), weight 129.3 kg, SpO2 98%.    General: awake, alert, appropriate,  Supine in bed; wife at bedside; NAD HENT: conjugate gaze; oropharynx moist CV: regular rate and rhythm; no JVD Pulmonary: CTA B/L; no W/R/R- good air movement GI: soft, NT, ND, (+)BS Psychiatric: appropriate- interactive Neurological: Ox3 Severe spasms in LE's this AM with a simple stretch- and mild at rest Psych: Pt's affect is appropriate. Pt is cooperative Skin: Clean and intact without signs of breakdown. Back incision CDI--not reassessed today  PRIOR EXAMS: Neuro:  Alert and oriented x 3. Normal insight and awareness. Intact Memory. Normal language and speech. Cranial nerve exam unremarkable. MMT: UE motor 5/5. LLE 3- to 3/5 HF, KE and 1+ PF and tr-1 ADF. RLE: 3- to 3/5 HF, KE and 1 APF, tr ADF.  T8 sensory level. Can sense LT and pain in both legs, lesser on feet?, DTR's 1+, no resting tone today. No clonus appreciated Musculoskeletal: back in TLSO, tender with sitting.      Assessment/Plan: 1. Functional deficits which require 3+ hours per day of interdisciplinary therapy in a comprehensive inpatient rehab setting. Physiatrist is providing close team supervision and 24 hour management of active medical problems listed below. Physiatrist and rehab team continue to assess barriers to discharge/monitor patient progress toward functional and medical goals  Care Tool:  Bathing    Body parts bathed by patient: Right arm, Face, Left arm, Chest, Abdomen, Right upper leg, Left upper leg, Front perineal area   Body parts bathed by helper: Right lower leg, Left lower leg     Bathing assist Assist Level: Minimal Assistance - Patient > 75%     Upper Body Dressing/Undressing Upper body dressing   What is the patient wearing?: Pull over shirt    Upper body assist Assist Level: Set up assist    Lower Body Dressing/Undressing Lower body dressing      What is the patient wearing?: Underwear/pull up, Pants     Lower body assist Assist for lower body dressing: Minimal Assistance - Patient > 75%     Toileting Toileting  Toileting assist Assist for toileting: Moderate Assistance - Patient 50 - 74%     Transfers Chair/bed transfer  Transfers assist     Chair/bed transfer assist level: Moderate Assistance - Patient 50 - 74%     Locomotion Ambulation   Ambulation assist      Assist level: 2 helpers Assistive device: Walker-rolling Max distance: 42 ft   Walk 10 feet activity   Assist     Assist level: 2 helpers Assistive device: Walker-rolling   Walk 50 feet activity   Assist Walk 50 feet with 2 turns activity did not occur: Safety/medical concerns         Walk 150 feet activity   Assist Walk 150 feet activity did not occur: Safety/medical concerns         Walk 10 feet on uneven surface  activity   Assist Walk 10 feet on uneven surfaces activity did not occur: Safety/medical concerns          Wheelchair     Assist Is the patient using a wheelchair?: Yes Type of Wheelchair: Manual    Wheelchair assist level: Supervision/Verbal cueing Max wheelchair distance: 100    Wheelchair 50 feet with 2 turns activity    Assist        Assist Level: Supervision/Verbal cueing   Wheelchair 150 feet activity     Assist      Assist Level: Minimal Assistance - Patient > 75%   Blood pressure 125/86, pulse 72, temperature 98.4 F (36.9 C), temperature source Oral, resp. rate 18, height 6\' 1"  (1.854 m), weight 129.3 kg, SpO2 98%.  Medical Problem List and Plan: 1. Functional deficits secondary to T7 thoracic spinal cord injury             -patient may shower             -ELOS/Goals: modI 10-14 days             Con't CIR PT and OT-   Team conference today to determine length of stay  -pt given grounds pass 2.  Antithrombotics: -DVT/anticoagulation:  DVT study neg 12/27/22; Mechanical: Sequential compression devices, below knee Bilateral lower extremities; now on lovenox 30mg  q12h              3. Pain Management:  PRN oxycodone seems to be helping to control pain with activity 4. Mood/Behavior/Sleep: LCSW to follow for evaluation and support.              -antipsychotic agents: N/A             --trazodone prn for insomnia.   -12/27/22 added melatonin 5mg  QHS PRN to help with sleep-improved 5. Neuropsych/cognition: This patient is capable of making decisions on his own behalf. 6. Skin/Wound Care: Routine pressure relief measures. 7. Fluids/Electrolytes/Nutrition: encourage PO  -mild hyponatremia--will recheck on Monday. No obvious causes at this time  9/9- Na 136 this AM 8. HTN: Monitor BP TID--continue avapro 75mg  daily for diovan  -9/7-8/24 BPs doing good, cont regimen  9/9- BP's a little on high side- 120s-140s systolic- wil monitor for trend- make sure, although unlikely, that's it's not AD. Vitals:   12/26/22 1252 12/26/22 2025 12/27/22 0339 12/27/22 1343  BP:  109/85 127/73 123/72 127/77   12/27/22 1953 12/28/22 0334 12/28/22 1500 12/28/22 2005  BP: 136/80 (!) 149/78 137/77 (!) 145/81   12/29/22 0550 12/29/22 1309 12/29/22 1842 12/30/22 0611  BP: 130/75 136/88 (!) 140/78 125/86    9. T2DM: Hgb A1c 6.7- -  Monitor BS ac/hs and use SSI for elevated BS             --continue metformin XR 500mg  daily  -9/7-8/24 CBG's controlled  9/9- 9/10-CBGs controlled- con't regimen CBG (last 3)  Recent Labs    12/29/22 1611 12/29/22 2133 12/30/22 0612  GLUCAP 98 131* 125*     10. Neurogenic bowel: - Enema administered with only small result-->KUB showed moderate to large stool burden.              --continue Senna S to 2 tabs bid -schedule miralax starting tomorrow -9/6 give sorbitol 60cc today, SSE if needed in pm  -12/28/22 LBM last night, moving bowels better, cont regimen 9/9- LBM 1-2 days ago, however getting to bathroom this AM to have BM with OT 9/10- LBM yesterday 11. Spasticity: continue baclofen 5mg  QID--this dose seems to be working for him without causing too much fatigue or weakness-- getting it nightly now, and TID PRN.    9/9- pt reports that was getting 10 mg QID, however felt too weak- ok with scheduling Baclofen since spasms out of control on prn/at bedtime- changed to 5 mg QID  9/10- will add Zanaflex 2 mg TID and another 2 mg at bedtime to help spasms- my concern is that pt might be developing an illness- sometimes spike in spasms can be from infection brewing, but he feels well right now- educated pt on this concept.    I spent a total of 57   minutes on total care today- >50% coordination of care- due to  Team conference today- to determine length of stay; d/w nursing about spasms- as well as prolonged d/w pt about how to get better control of spasms/spasticity.   LOS: 6 days A FACE TO FACE EVALUATION WAS PERFORMED  Tyarra Nolton 12/30/2022, 8:19 AM

## 2022-12-30 NOTE — Progress Notes (Signed)
Physical Therapy Session Note  Patient Details  Name: Mario [name] MRN: [id number] Date of Birth: [birth year]  Today's Date: 12/30/2022 PT Individual Time: 1430-1530 PT Individual Time Calculation (min): 60 min   Short Term Goals: Week 1:  PT Short Term Goal 1 (Week 1): Pt will ambulate >50 ft with assist PT Short Term Goal 2 (Week 1): Pt will progress to consistent min A STS and intemittent CGA PT Short Term Goal 3 (Week 1): Pt will self propel w/c throughout whole session.  Skilled Therapeutic Interventions/Progress Updates:    Pt seated in w/c on arrival and agreeable to therapy. Pt reports 9/10 back pain, recd medication from nsg at start of session, seated exercise this session for pain management. Pt also transported to outdoor environment for holistic benefit of sunshine and fresh air.  Pt performed the following exercises to promote LE strength and endurance:  2 x 6 seated marches with 5lb ankle weight, but found this to be too much resistance at this time LAQ 4 x 8 with RLE and 4 x 12 with LLE, with  5lb weight Seated calf raises with 5lb weight  x 10 discontinued d/t back pain   Discussed alternative pain management options (ice, heat, biofreeze) for both in hospital and home use. Pt set with ice pack at end of session.   Pt returned to unit and to room and was assisted back to bed, mod A d/t pain. Pt was left with ice pack and was left with all needs in reach and alarm active.   Therapy Documentation Precautions:  Precautions Precautions: Back, Fall Precaution Comments: reviewed back precautions Required Braces or Orthoses: Spinal Brace Spinal Brace: Thoracolumbosacral orthotic, Applied in sitting position, Other (comment) Spinal Brace Comments: no brace ordered, but pt prefers for comfort Restrictions Weight Bearing Restrictions: No General:       Therapy/Group: Individual Therapy  [name] 12/30/2022, 2:49 PM

## 2022-12-30 NOTE — Progress Notes (Signed)
Occupational Therapy Session Note  Patient Details  Name: Mario [name] MRN: [id number] Date of Birth: 01-28-1981  Today's Date: 12/30/2022 OT Individual Time: 9629-5284 OT Individual Time Calculation (min): 73 min    Short Term Goals: Week 1:  OT Short Term Goal 1 (Week 1): Pt will stand while grooming for >2:00 with AE as necessary with Min A OT Short Term Goal 2 (Week 1): Pt will complete bathing at overall Mod A with AE as necessary  Skilled Therapeutic Interventions/Progress Updates:      Therapy Documentation Precautions:  Precautions Precautions: Back, Fall Precaution Comments: reviewed back precautions Required Braces or Orthoses: Spinal Brace Spinal Brace: Thoracolumbosacral orthotic, Applied in sitting position, Other (comment) Spinal Brace Comments: no brace ordered, but pt prefers for comfort Restrictions Weight Bearing Restrictions: No General: "I have been doing really good." Pt supine in bed upon OT arrival, agreeable to OT session. Part of session held outside for increased QOL.  Pain:  7/10 pain reported in low back, activity, intermittent rest breaks, distractions provided for pain management, pt reports tolerable to proceed.   ADL: Bed mobility:CGA for supine>EOB using log roll method Footwear: total A to don shoes and foot up brace Toilet transfer: CGA ambulating from W/C><toilet ~20 ft overall  Toileting: CGA standing at RW to urinate in toilet, no LOB, able to manage pants Tub/shower transfer: CGA with RW to TTB, educated on curtain placement in order to prevent water on floor during shower  Exercises: Pt completed the following exercise circuit in order to improve functional activity, strength and endurance to prepare for ADLs such as bathing. Pt completed the following exercises in seated/standing position with no noted LOB/SOB: -3x5 sit to stand transfers using rail for support, rest breaks in between trials -2:05 minute stand with UE support on  rail, with intermittent unsupported standing for ~5 seconds each trial  Other treatments: Pt and OT discussed needing 3-1 commode and TTB for D/C next week.   Pt seated in W/C at end of session with W/C alarm donned, call light within reach and 4Ps assessed.    Therapy/Group: Individual Therapy  Velia Meyer, OTD, OTR/L 12/30/2022, 4:09 PM

## 2022-12-30 NOTE — Progress Notes (Signed)
Physical Therapy Session Note  Patient Details  Name: Mario [name] MRN: [id number] Date of Birth: Jun 21, 1980  Today's Date: 12/30/2022 PT Individual Time: 4098-1191 PT Individual Time Calculation (min): 70 min   Short Term Goals: Week 1:  PT Short Term Goal 1 (Week 1): Pt will ambulate >50 ft with assist PT Short Term Goal 2 (Week 1): Pt will progress to consistent min A STS and intemittent CGA PT Short Term Goal 3 (Week 1): Pt will self propel w/c throughout whole session.   Skilled Therapeutic Interventions/Progress Updates:  Patient supine in bed on entrance to room. Patient alert and agreeable to PT session.   Patient with no pain complaint at start of session but does relate increase in muscle spasms especially overnight that Dr. [name] is addressing with addition of 2nd muscle relaxer.   Therapeutic Activity: Bed Mobility: Pt performed supine <> sit with close supervision. Good seated balance with BLE on floor. Transfers: Pt performed sit<>stand from elevated bed surface to RW with CGA. Stand pivot to w/c to R side with CGA. VC for Bil knee stability during stance phase.  Wheelchair Mobility:  Pt propelled wheelchair at least 75 ft prior to brief rest break then completes distance to main therapy gym toward stairs. Completes all with supervision and min vc for technique in turns and pivot point of w/c.  Neuromuscular Re-ed: NMR facilitated during session with focus on motor control of BLE. Pt guided in toe taps to 3" steps  using BHR and bringing foot to first step only. Then progressed to first step toe tap, then up to 2nd step then down to first step and back to floor. Good control noted with increased focus on required movements.   Progressed to 2nd step again with therapist hold to R knee for stability and lunge forward onto LLE with focus on return push into knee extension and with progressing reduced assist from BUE. Switched to RLE and able to perform. Two instances of  light buckle from L knee with guard, but pt able to catch.   Standing NMR to challenge balance with touch of rubber basketball to top of basketball goal with LUE then requiring no UE support to RW to pass ball to R hand and touch to top of goal backboard. Requires at least one hand on RW for first 2 rounds then focuses on no UE support for final 3 rounds. Light CGA provided throughout with good motor control of BLE and trunk.  NMR performed for improvements in motor control and coordination, balance, sequencing, judgement, and self confidence/ efficacy in performing all aspects of mobility at highest level of independence.   Gait Training:  Pt guided in short distance ambulation including ambulation over small sticks on floor with good foot clearance bilaterally. Pt self chooses leading with LLE and good clearance with RLE. Uses RW throughout and initially guided with vc for sequencing but then able to perform well without cues.   Patient supine in bed at end of session with brakes locked, bed alarm set, and all needs within reach.    Therapy Documentation Precautions:  Precautions Precautions: Back, Fall Precaution Comments: reviewed back precautions Required Braces or Orthoses: Spinal Brace Spinal Brace: Thoracolumbosacral orthotic, Applied in sitting position, Other (comment) Spinal Brace Comments: no brace ordered, but pt prefers for comfort Restrictions Weight Bearing Restrictions: No General:   Vital Signs:   Pain: Low level pain at start of session with minimal raise during mobility. Pt relates ability to tolerate well throughout session  d/t no spasming.   Therapy/Group: Individual Therapy  Loel Dubonnet PT, DPT, CSRS 12/30/2022, 10:21 AM

## 2022-12-31 DIAGNOSIS — G8222 Paraplegia, incomplete: Secondary | ICD-10-CM | POA: Diagnosis not present

## 2022-12-31 DIAGNOSIS — F4323 Adjustment disorder with mixed anxiety and depressed mood: Secondary | ICD-10-CM

## 2022-12-31 LAB — GLUCOSE, CAPILLARY
Glucose-Capillary: 130 mg/dL — ABNORMAL HIGH (ref 70–99)
Glucose-Capillary: 79 mg/dL (ref 70–99)
Glucose-Capillary: 101 mg/dL — ABNORMAL HIGH (ref 70–99)

## 2022-12-31 NOTE — Progress Notes (Signed)
Physical Therapy Session Note  Patient Details  Name: Mario [name] MRN: [id number] Date of Birth: 1981-03-30  Today's Date: 12/31/2022 PT Individual Time: 0805-0900 and 1305-1420 PT Individual Time Calculation (min): 55 min and 75 min  Short Term Goals: Week 1:  PT Short Term Goal 1 (Week 1): Pt will ambulate >50 ft with assist PT Short Term Goal 2 (Week 1): Pt will progress to consistent min A STS and intemittent CGA PT Short Term Goal 3 (Week 1): Pt will self propel w/c throughout whole session.  Skilled Therapeutic Interventions/Progress Updates: Tx1: Pt presented in bed agreeable to therapy. Pt c/o pain lower trap/latissimus L>R which pt contributes to heavy reliance on RW. Pt completed supine to sit with supervision, use of bed features and increased time. PTA introduced Theracane with pt able to reach trigger points with more ease. PTA also performed cross friction and DTP in area as well with pt stating some relief. PTA then donned shoes total A and pt completed stand pivot transfer to w/c with minA overall. Pt propelled to main gym with supervision for general endurance. Pt then completed Sit to stand with minA and stand pivot to high/low mat. Pt then participated in Sit to stand with emphasis on using BLE (using BUE from mat) and minimizing against mat. Pt required minA as PTA providing facilitation to improve anterior weight shifting. Pt also worked on standing tolerance performing bimanual activity using sponge "Lego" blocks. Pt was able to complete x 3 tasks with increased sway noted with fatigue but pt only intermittently using fingertip support on tray. After seated rest pt then ambulated ~82ft with RW and CGA fading to minA with fatigue. Pt noted to have decreased L foot clearance with toe intermittently catching but no overt LOB. Pt then transported remaining distance back to room and agreeable to remain in w/c with call bell within reach and needs met.    Tx2: Pt presented in bed  with wife present agreeable to therapy. Pt c/o pain 7/10 with pt premedicated. Pt completed supine to sit with CGA and use of bed features. Pt donned shoes with modA and use of long handled shoe horn. Pt then ambulated with RW and CGA to hallway ~11ft to w/c. Pt then propelled to main gym with supervision. Participated in 3in steps ups with LLE x 5 for LE strengthening. Pt then completed toe taps to second step for increased ms recruitment of hip flexors, pt then performed same activity with RLE requiring increased effort due to decreased proprioception. Pt then transported to [facility] entrance for environmental change. Performed LAQ with 4lb weights x 10, seated hip flexion 4lb weights x 10, heel lifts without weights, then stood at rail and completed hip extension with 4ln weights x 10 bilaterally. Pt then transported back to room and pt completed ambulatory transfer to bed with minA. At EOB PTA doffed shoes total A and pt completed sit to supine with use of bed rail and increased time with supervision. Pt repositioned to comfort and PTA placed hot packs to pt's back for decreased ms tension. Pt left in bed at end of session with bed alarm on, call bell within reach and needs met.      Therapy Documentation Precautions:  Precautions Precautions: Back, Fall Precaution Comments: reviewed back precautions Required Braces or Orthoses: Spinal Brace Spinal Brace: Thoracolumbosacral orthotic, Applied in sitting position, Other (comment) Spinal Brace Comments: no brace ordered, but pt prefers for comfort Restrictions Weight Bearing Restrictions: No   Therapy/Group: Individual Therapy  [employer] 12/31/2022, 12:58 PM

## 2022-12-31 NOTE — Progress Notes (Signed)
PROGRESS NOTE   Subjective/Complaints:  Spasms are better controlled- much less spasticity overnight- Nowhere as much.  Pain in Lateral scapula /side of ribs.   Poor sleep overnight- usually ok with melatonin.   ROS:    Pt denies SOB, abd pain, CP, N/V/C/D, and vision changes   Except for HPI  Objective:   No results found. Recent Labs    12/29/22 0706  WBC 8.1  HGB 13.8  HCT 41.5  PLT 462*    Recent Labs    12/29/22 0706  NA 136  K 4.0  CL 101  CO2 26  GLUCOSE 120*  BUN 13  CREATININE 0.99  CALCIUM 9.5     Intake/Output Summary (Last 24 hours) at 12/31/2022 0747 Last data filed at 12/31/2022 0040 Gross per 24 hour  Intake 1200 ml  Output 1000 ml  Net 200 ml        Physical Exam: Vital Signs Blood pressure 126/75, pulse 62, temperature 97.6 F (36.4 C), temperature source Oral, resp. rate 16, height 6\' 1"  (1.854 m), weight 129.3 kg, SpO2 95%.     General: awake, alert, appropriate, Supine in bed; NAD HENT: conjugate gaze; oropharynx moist CV: regular rate and rhythm; no JVD Pulmonary: CTA B/L; no W/R/R- good air movement GI: soft, NT, ND, (+)BS Psychiatric: appropriate Neurological: Ox3 Spasms much better controlled Psych: Pt's affect is appropriate. Pt is cooperative Skin: Clean and intact without signs of breakdown. Back incision CDI--not reassessed today  PRIOR EXAMS: Neuro:  Alert and oriented x 3. Normal insight and awareness. Intact Memory. Normal language and speech. Cranial nerve exam unremarkable. MMT: UE motor 5/5. LLE 3- to 3/5 HF, KE and 1+ PF and tr-1 ADF. RLE: 3- to 3/5 HF, KE and 1 APF, tr ADF.  T8 sensory level. Can sense LT and pain in both legs, lesser on feet?, DTR's 1+, no resting tone today. No clonus appreciated Musculoskeletal: back in TLSO, tender with sitting.     Assessment/Plan: 1. Functional deficits which require 3+ hours per day of interdisciplinary  therapy in a comprehensive inpatient rehab setting. Physiatrist is providing close team supervision and 24 hour management of active medical problems listed below. Physiatrist and rehab team continue to assess barriers to discharge/monitor patient progress toward functional and medical goals  Care Tool:  Bathing    Body parts bathed by patient: Right arm, Face, Left arm, Chest, Abdomen, Right upper leg, Left upper leg, Front perineal area   Body parts bathed by helper: Right lower leg, Left lower leg     Bathing assist Assist Level: Minimal Assistance - Patient > 75%     Upper Body Dressing/Undressing Upper body dressing   What is the patient wearing?: Pull over shirt    Upper body assist Assist Level: Set up assist    Lower Body Dressing/Undressing Lower body dressing      What is the patient wearing?: Underwear/pull up, Pants     Lower body assist Assist for lower body dressing: Minimal Assistance - Patient > 75%     Toileting Toileting    Toileting assist Assist for toileting: Moderate Assistance - Patient 50 - 74%     Transfers Chair/bed  transfer  Transfers assist     Chair/bed transfer assist level: Moderate Assistance - Patient 50 - 74%     Locomotion Ambulation   Ambulation assist      Assist level: 2 helpers Assistive device: Walker-rolling Max distance: 42 ft   Walk 10 feet activity   Assist     Assist level: 2 helpers Assistive device: Walker-rolling   Walk 50 feet activity   Assist Walk 50 feet with 2 turns activity did not occur: Safety/medical concerns         Walk 150 feet activity   Assist Walk 150 feet activity did not occur: Safety/medical concerns         Walk 10 feet on uneven surface  activity   Assist Walk 10 feet on uneven surfaces activity did not occur: Safety/medical concerns         Wheelchair     Assist Is the patient using a wheelchair?: Yes Type of Wheelchair: Manual    Wheelchair  assist level: Supervision/Verbal cueing Max wheelchair distance: 100    Wheelchair 50 feet with 2 turns activity    Assist        Assist Level: Supervision/Verbal cueing   Wheelchair 150 feet activity     Assist      Assist Level: Minimal Assistance - Patient > 75%   Blood pressure 126/75, pulse 62, temperature 97.6 F (36.4 C), temperature source Oral, resp. rate 16, height 6\' 1"  (1.854 m), weight 129.3 kg, SpO2 95%.  Medical Problem List and Plan: 1. Functional deficits secondary to T7 thoracic spinal cord injury             -patient may shower             -ELOS/Goals: modI 10-14 days            D/c 9/20  Con't CIR PT and OT  Asked therapy to do myofascial release of L scapula area  -pt given grounds pass 2.  Antithrombotics: -DVT/anticoagulation:  DVT study neg 12/27/22; Mechanical: Sequential compression devices, below knee Bilateral lower extremities; now on lovenox 30mg  q12h              3. Pain Management:  PRN oxycodone seems to be helping to control pain with activity 4. Mood/Behavior/Sleep: LCSW to follow for evaluation and support.              -antipsychotic agents: N/A             --trazodone prn for insomnia.   -12/27/22 added melatonin 5mg  QHS PRN to help with sleep-improved  9/11- Didn't sleep well- will make sure has trazodone prn- take by 11pm 5. Neuropsych/cognition: This patient is capable of making decisions on his own behalf. 6. Skin/Wound Care: Routine pressure relief measures. 7. Fluids/Electrolytes/Nutrition: encourage PO  -mild hyponatremia--will recheck on Monday. No obvious causes at this time  9/9- Na 136 this AM 8. HTN: Monitor BP TID--continue avapro 75mg  daily for diovan  -9/7-8/24 BPs doing good, cont regimen  9/9- BP's a little on high side- 120s-140s systolic- wil monitor for trend- make sure, although unlikely, that's it's not AD.  9/11- BP controlled- con't Vitals:   12/27/22 1343 12/27/22 1953 12/28/22 0334 12/28/22 1500  BP:  127/77 136/80 (!) 149/78 137/77   12/28/22 2005 12/29/22 0550 12/29/22 1309 12/29/22 1842  BP: (!) 145/81 130/75 136/88 (!) 140/78   12/30/22 0611 12/30/22 1540 12/30/22 2027 12/31/22 0500  BP: 125/86 127/67 126/70 126/75    9. T2DM:  Hgb A1c 6.7- - Monitor BS ac/hs and use SSI for elevated BS             --continue metformin XR 500mg  daily  -9/11- will reduce CBGs to BID before breakfast and lunch CBG (last 3)  Recent Labs    12/30/22 1643 12/30/22 2139 12/31/22 0607  GLUCAP 99 104* 130*     10. Neurogenic bowel: - Enema administered with only small result-->KUB showed moderate to large stool burden.              --continue Senna S to 2 tabs bid -schedule miralax starting tomorrow -9/6 give sorbitol 60cc today, SSE if needed in pm  -12/28/22 LBM last night, moving bowels better, cont regimen 9/9- LBM 1-2 days ago, however getting to bathroom this AM to have BM with OT 9/10- LBM yesterday 9/11- LBM yesterday 11. Spasticity: continue baclofen 5mg  QID--this dose seems to be working for him without causing too much fatigue or weakness-- getting it nightly now, and TID PRN.    9/9- pt reports that was getting 10 mg QID, however felt too weak- ok with scheduling Baclofen since spasms out of control on prn/at bedtime- changed to 5 mg QID  9/10- will add Zanaflex 2 mg TID and another 2 mg at bedtime to help spasms- my concern is that pt might be developing an illness- sometimes spike in spasms can be from infection brewing, but he feels well right now- educated pt on this concept.   9/11- MUCH better today-  12. Myofascial pain  9/11- having pain in lateral scapula/teres- will get therapy to do myofascial release due to trigger point injections  I spent a total of 42   minutes on total care today- >50% coordination of care- due to  D/w pt about spasticity and spasms- also pain- d/w therapy about massage/myofascial release with therapists.   LOS: 7 days A FACE TO FACE EVALUATION WAS  PERFORMED  Mario [name] 12/31/2022, 7:47 AM

## 2022-12-31 NOTE — Consult Note (Signed)
Neuropsychological Consultation Comprehensive Inpatient Rehab   Patient:   Mario [name]   DOB:   July 17, 1980  MR Number:  [id number]  Location:  [facility] [address] [place] [address] Dept: [phone] Loc: [phone]           Date of Service:   12/31/2022  Start Time:   3 PM End Time:   4 PM  Provider/Observer:  Arley Phenix, Psy.D.       Clinical Neuropsychologist       Billing Code/Service: [phone]  Reason for Service:    Mario [name] is a 42 year old male referred for neuropsychological consultation due to coping and adjustment issues during his ongoing inpatient admission onto the comprehensive inpatient rehabilitation unit.  Patient has a past medical history including hypertension, type 2 diabetes, diverticulitis, back pain.  Patient started developing balance issues with fall and numbness from waist down.  There had been plans for neurosurgical interventions but was admitted on 12/14/2022 with worsening pain and increased numbness.  Workup indicated significant acute issues and patient underwent thoracic laminectomy and spinal cord decompression on 8/27.  During admission workup the patient did test positive for both opiate and cocaine with the patient having a prescription for opiate pain medications due to his chronic pain condition.  During today's visit the patient was open about his substance use history and we addressed this directly for some time.  Patient was oriented x 4 with good cognition but admitted to frustration and coping issues with significant loss of function and a lot of stress regarding some financial stressors and inability to work at the time.  We addressed the issue about return to work and the fact that the neurosurgeon will have to play a big role in release for work but that decisions will be made to make sure that we do not end up doing something that sets him back as  far as his recovery.  Patient has been making improvements and reports that his pain has been better overall postsurgery although he does continue to have 1 area of significant pain.  Patient reports that he is making functional gains in therapy and remains motivated even though admitting there are times when he considers not doing a particular therapy session because of pain but knows that these efforts are important.  HPI for the current admission:    HPI:  Mario [name] is a 42 year old male with history of HTN, T2DM, diverticulitis, back pain/strain in May who started developing balance issues fall and numbness from waist down. He had seen NS with plans for surgery but was admitted on 12/14/22 with worsening of pain, increase in numbness and increase in BLE weakness w/difficulty walking. He was admitted for work up and evaluated by Dr. 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 UDS positive for opiates and cocaine. He underwent T7-T10 laminectomy with bilateral partial facetectomy and spinal cord decompression on 08/27.    Post op has had some improvement in weakness but continues to have R>LLE weakness and working on standing in [place]. Back brace ordered per patient preference. He continues to be limited by weakness, numbness from waist down as well as LE spasms. He was independent and working till 09/2022 but had  steady decline needing cane to walker 3 weeks PTA--CIR recommended due to functional decline.  Feels numbness from abdomen downward.   Medical History:   Past Medical History:  Diagnosis Date   Bronchitis    Diabetes (HCC)    Diverticulitis 10/2015   Hypertension          Patient Active Problem List   Diagnosis Date Noted   Adjustment disorder with  mixed anxiety and depressed mood 12/31/2022   Acute incomplete paraplegia (HCC) 12/29/2022   Thoracic myelopathy 12/15/2022    Behavioral Observation/Mental Status:   Mario [name]  presents as a 42 y.o.-year-old Right handed African American Male who appeared his stated age. his dress was Appropriate and he was Well Groomed and his manners were Appropriate to the situation.  his participation was indicative of Appropriate behaviors.  There were physical disabilities noted.  he displayed an appropriate level of cooperation and motivation.    Interactions:    Active Appropriate and Attentive  Attention:   within normal limits and attention span and concentration were age appropriate  Memory:   within normal limits; recent and remote memory intact  Visuo-spatial:   not examined  Speech (Volume):  low  Speech:   normal; normal  Thought Process:  Coherent and Relevant  Directed and Organized  Though Content:  WNL; not suicidal and not homicidal  Orientation:   person, place, time/date, and situation  Judgment:   Good  Planning:   Fair  Affect:    Appropriate  Mood:    Dysphoric  Insight:   Good  Intelligence:   normal  Family Med/Psych History:  Family History  Problem Relation Age of Onset   Hyperlipidemia Mother    Diabetes Mother    Heart disease Father     Impression/DX:   Mario [name] is a 42 year old male referred for neuropsychological consultation due to coping and adjustment issues during his ongoing inpatient admission onto the comprehensive inpatient rehabilitation unit.  Patient has a past medical history including hypertension, type 2 diabetes, diverticulitis, back pain.  Patient started developing balance issues with fall and numbness from waist down.  There had been plans for neurosurgical interventions but was admitted on 12/14/2022 with worsening pain and increased numbness.  Workup indicated significant acute issues and patient underwent thoracic  laminectomy and spinal cord decompression on 8/27.  During admission workup the patient did test positive for both opiate and cocaine with the patient having a prescription for opiate pain medications due to his chronic pain condition.  During today's visit the patient was open about his substance use history and we addressed this directly for some time.  Patient was oriented x 4 with good cognition but admitted to frustration and coping issues with significant loss of function and a lot of stress regarding some financial stressors and inability to work at the time.  We addressed the issue about return to work and the fact that the neurosurgeon will have to play a big role in release for work but that decisions will be made to make sure that we do not end up doing something that sets him back as far as his recovery.  Patient has been making improvements and reports that his pain has been better overall postsurgery although he does continue to have 1 area of significant pain.  Patient reports that he is making functional gains in therapy and remains motivated even though admitting there are times when he considers not doing a particular therapy session because of pain  but knows that these efforts are important.  Disposition/Plan:  Today we worked on coping and adjustment issues particularly around coping with significant loss of function and chronic pain with improving acute status.  Patient was able to discuss history of substance use in an open and frank manner and we addressed these issues with the consideration about future efforts and plans.  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         Electronically Signed   _______________________ Arley Phenix, Psy.D. Clinical Neuropsychologist

## 2022-12-31 NOTE — Progress Notes (Signed)
Occupational Therapy Session Note  Patient Details  Name: Mario [name] MRN: [id number] Date of Birth: February 25, 1981  Today's Date: 12/31/2022 OT Individual Time: 0945-1100 OT Individual Time Calculation (min): 75 min    Short Term Goals: Week 1:  OT Short Term Goal 1 (Week 1): Pt will stand while grooming for >2:00 with AE as necessary with Min A OT Short Term Goal 2 (Week 1): Pt will complete bathing at overall Mod A with AE as necessary  Skilled Therapeutic Interventions/Progress Updates:   Pt seen for skilled OT session with MD secure chatting team this am to address L scap/teres pain and tightness in treatment. PT reported use of Theracane in previous visit with some releif. See pain report and interventions below. Applied moist heat to L scap region in supine prior to gentle soft tissue and MFR techniques. Full shower retraining with T level incision covered for waterproofing. Pt moved supine to sit with bed features and S, sit to stand with min A, amb with min A with RW 15 ft from EOB to stall shower TTB and back to EOB. Mod a despite LH sponge for LB self care and CGA with UB bathing. Dressing with max fading to mod A for LB dressing, set up for UB. Incision remained c/d/I with steri strips noted. Pt moved to supine with mod A for LE mngt. Reported reduction of overall scap/teres pain to 5/10. Left pt bed level with bed alarm set, needs and nurse call button in reach.   Pain: initially reported 8/10 down from 9/10 early this am. PT issued and trained in Theracane to use in room and OT applied moist heat to L sided scap/teres region for 20 min.   Therapy Documentation Precautions:  Precautions Precautions: Back, Fall Precaution Comments: reviewed back precautions Required Braces or Orthoses: Spinal Brace Spinal Brace: Thoracolumbosacral orthotic, Applied in sitting position, Other (comment) Spinal Brace Comments: no brace ordered, but pt prefers for comfort Restrictions Weight  Bearing Restrictions: No    Therapy/Group: Individual Therapy  Vicenta Dunning 12/31/2022, 7:40 AM

## 2023-01-01 DIAGNOSIS — G8222 Paraplegia, incomplete: Secondary | ICD-10-CM | POA: Diagnosis not present

## 2023-01-01 LAB — BASIC METABOLIC PANEL
Anion gap: 11 (ref 5–15)
BUN: 8 mg/dL (ref 6–20)
CO2: 26 mmol/L (ref 22–32)
Calcium: 9.5 mg/dL (ref 8.9–10.3)
Chloride: 101 mmol/L (ref 98–111)
Creatinine, Ser: 0.88 mg/dL (ref 0.61–1.24)
GFR, Estimated: >60 mL/min (ref >60)
Glucose, Bld: 132 mg/dL — ABNORMAL HIGH (ref 70–99)
Potassium: 4 mmol/L (ref 3.5–5.1)
Sodium: 138 mmol/L (ref 135–145)

## 2023-01-01 LAB — CBC
HCT: 41.6 % (ref 39.0–52.0)
Hemoglobin: 13.8 g/dL (ref 13.0–17.0)
MCH: 30.1 pg (ref 26.0–34.0)
MCHC: 33.2 g/dL (ref 30.0–36.0)
MCV: 90.6 fL (ref 80.0–100.0)
Platelets: 438 10*3/uL — ABNORMAL HIGH (ref 150–400)
RBC: 4.59 MIL/uL (ref 4.22–5.81)
RDW: 12.7 % (ref 11.5–15.5)
WBC: 6.7 10*3/uL (ref 4.0–10.5)
nRBC: 0 % (ref 0.0–0.2)

## 2023-01-01 LAB — GLUCOSE, CAPILLARY
Glucose-Capillary: 123 mg/dL — ABNORMAL HIGH (ref 70–99)
Glucose-Capillary: 94 mg/dL (ref 70–99)

## 2023-01-01 MED ORDER — INSULIN ASPART 100 UNIT/ML IJ SOLN
0.0000 [IU] | Freq: Two times a day (BID) | INTRAMUSCULAR | Status: DC
  Administered 2023-01-03: 1 [IU] via SUBCUTANEOUS

## 2023-01-01 MED ORDER — INSULIN ASPART 100 UNIT/ML IJ SOLN: 0.0000 [IU] | Freq: Two times a day (BID) | INTRAMUSCULAR | Status: DC

## 2023-01-01 NOTE — Progress Notes (Signed)
PROGRESS NOTE   Subjective/Complaints:  Spasms still doing better- no signs of illness.  More pain this AM, but was doing so well yesterday, might have overdid it.  Usually having 1 full BM/day in AM, but prior to SCI, went 2-3x/day- thinks emptying.  CBG of 83 yesterday (computer showed 79)- given OJ to make sure didn't drop.     ROS:    Pt denies SOB, abd pain, CP, N/V/C/D, and vision changes   Except for HPI  Objective:   No results found. No results for input(s): "WBC", "HGB", "HCT", "PLT" in the last 72 hours.   No results for input(s): "NA", "K", "CL", "CO2", "GLUCOSE", "BUN", "CREATININE", "CALCIUM" in the last 72 hours.    Intake/Output Summary (Last 24 hours) at 01/01/2023 0810 Last data filed at 01/01/2023 0747 Gross per 24 hour  Intake 1680 ml  Output 2000 ml  Net -320 ml        Physical Exam: Vital Signs Blood pressure (!) 142/81, pulse 74, temperature 98.2 F (36.8 C), temperature source Oral, resp. rate 16, height 6\' 1"  (1.854 m), weight 126.6 kg, SpO2 99%.      General: awake, alert, appropriate, sitting up in bed; NAD HENT: conjugate gaze; oropharynx moist CV: regular rate and rhythm; no JVD Pulmonary: CTA B/L; no W/R/R- good air movement GI: soft, NT, ND, (+)BS Psychiatric: appropriate- brighter affect Neurological: Ox3 Spasms much better controlled- no spontaneous spasms Psych: Pt's affect is appropriate. Pt is cooperative Skin: Clean and intact without signs of breakdown. Back incision CDI--not reassessed today  PRIOR EXAMS: Neuro:  Alert and oriented x 3. Normal insight and awareness. Intact Memory. Normal language and speech. Cranial nerve exam unremarkable. MMT: UE motor 5/5. LLE 3- to 3/5 HF, KE and 1+ PF and tr-1 ADF. RLE: 3- to 3/5 HF, KE and 1 APF, tr ADF.  T8 sensory level. Can sense LT and pain in both legs, lesser on feet?, DTR's 1+, no resting tone today. No clonus  appreciated Musculoskeletal: back in TLSO, tender with sitting.     Assessment/Plan: 1. Functional deficits which require 3+ hours per day of interdisciplinary therapy in a comprehensive inpatient rehab setting. Physiatrist is providing close team supervision and 24 hour management of active medical problems listed below. Physiatrist and rehab team continue to assess barriers to discharge/monitor patient progress toward functional and medical goals  Care Tool:  Bathing    Body parts bathed by patient: Right arm, Face, Left arm, Chest, Abdomen, Right upper leg, Left upper leg, Front perineal area   Body parts bathed by helper: Right lower leg, Left lower leg     Bathing assist Assist Level: Minimal Assistance - Patient > 75%     Upper Body Dressing/Undressing Upper body dressing   What is the patient wearing?: Pull over shirt    Upper body assist Assist Level: Set up assist    Lower Body Dressing/Undressing Lower body dressing      What is the patient wearing?: Underwear/pull up, Pants     Lower body assist Assist for lower body dressing: Minimal Assistance - Patient > 75%     Toileting Toileting    Toileting assist Assist for  toileting: Moderate Assistance - Patient 50 - 74%     Transfers Chair/bed transfer  Transfers assist     Chair/bed transfer assist level: Moderate Assistance - Patient 50 - 74%     Locomotion Ambulation   Ambulation assist      Assist level: 2 helpers Assistive device: Walker-rolling Max distance: 42 ft   Walk 10 feet activity   Assist     Assist level: 2 helpers Assistive device: Walker-rolling   Walk 50 feet activity   Assist Walk 50 feet with 2 turns activity did not occur: Safety/medical concerns         Walk 150 feet activity   Assist Walk 150 feet activity did not occur: Safety/medical concerns         Walk 10 feet on uneven surface  activity   Assist Walk 10 feet on uneven surfaces activity did  not occur: Safety/medical concerns         Wheelchair     Assist Is the patient using a wheelchair?: Yes Type of Wheelchair: Manual    Wheelchair assist level: Supervision/Verbal cueing Max wheelchair distance: 100    Wheelchair 50 feet with 2 turns activity    Assist        Assist Level: Supervision/Verbal cueing   Wheelchair 150 feet activity     Assist      Assist Level: Minimal Assistance - Patient > 75%   Blood pressure (!) 142/81, pulse 74, temperature 98.2 F (36.8 C), temperature source Oral, resp. rate 16, height 6\' 1"  (1.854 m), weight 126.6 kg, SpO2 99%.  Medical Problem List and Plan: 1. Functional deficits secondary to T7 thoracic spinal cord injury             -patient may shower             -ELOS/Goals: modI 10-14 days            D/c 9/20  Con't CIR PT and OT  Myofascial release with theracane helping  -pt given grounds pass 2.  Antithrombotics: -DVT/anticoagulation:  DVT study neg 12/27/22; Mechanical: Sequential compression devices, below knee Bilateral lower extremities; now on lovenox 30mg  q12h              3. Pain Management:  PRN oxycodone seems to be helping to control pain with activity  0/12- pain worse this AM but thinks overdid it yesterday- taking ~ 3-4x in 24 hours- usually 10 mg 4. Mood/Behavior/Sleep: LCSW to follow for evaluation and support.              -antipsychotic agents: N/A             --trazodone prn for insomnia.   -12/27/22 added melatonin 5mg  QHS PRN to help with sleep-improved  9/11- Didn't sleep well- will make sure has trazodone prn- take by 11pm  9/12- slept better 5. Neuropsych/cognition: This patient is capable of making decisions on his own behalf. 6. Skin/Wound Care: Routine pressure relief measures. 7. Fluids/Electrolytes/Nutrition: encourage PO  -mild hyponatremia--will recheck on Monday. No obvious causes at this time  9/9- Na 136 this AM 8. HTN: Monitor BP TID--continue avapro 75mg  daily for  diovan  -9/7-8/24 BPs doing good, cont regimen  9/9- BP's a little on high side- 120s-140s systolic- wil monitor for trend- make sure, although unlikely, that's it's not AD.  9/11-9/12- BP controlled- con't Vitals:   12/28/22 1500 12/28/22 2005 12/29/22 0550 12/29/22 1309  BP: 137/77 (!) 145/81 130/75 136/88   12/29/22 1842 12/30/22  4540 12/30/22 1540 12/30/22 2027  BP: (!) 140/78 125/86 127/67 126/70   12/31/22 0500 12/31/22 1313 12/31/22 2002 01/01/23 0429  BP: 126/75 121/70 139/86 (!) 142/81    9. T2DM: Hgb A1c 6.7- - Monitor BS ac/hs and use SSI for elevated BS             --continue metformin XR 500mg  daily  -9/11- will reduce CBGs to BID before breakfast and lunch  9/12- had BG of 79-83 yesterday afternoon/evening- was given OJ just in case CBG (last 3)  Recent Labs    12/31/22 1632 12/31/22 2105 01/01/23 0611  GLUCAP 79 101* 123*     10. Neurogenic bowel: - Enema administered with only small result-->KUB showed moderate to large stool burden.              --continue Senna S to 2 tabs bid -schedule miralax starting tomorrow -9/6 give sorbitol 60cc today, SSE if needed in pm  -12/28/22 LBM last night, moving bowels better, cont regimen 9/9- LBM 1-2 days ago, however getting to bathroom this AM to have BM with OT 9/10- LBM yesterday 9/11- LBM yesterday 9/12- going most days in AM, but not so far this AM- LBM yesterday 11. Spasticity: continue baclofen 5mg  QID--this dose seems to be working for him without causing too much fatigue or weakness-- getting it nightly now, and TID PRN.    9/9- pt reports that was getting 10 mg QID, however felt too weak- ok with scheduling Baclofen since spasms out of control on prn/at bedtime- changed to 5 mg QID  9/10- will add Zanaflex 2 mg TID and another 2 mg at bedtime to help spasms- my concern is that pt might be developing an illness- sometimes spike in spasms can be from infection brewing, but he feels well right now- educated pt on this  concept.   9/11- MUCH better today-   9/12- spasms doing better with addition/increase of Zanaflex 12. Myofascial pain  9/11- having pain in lateral scapula/teres- will get therapy to do myofascial release due to trigger point injections  9/12- Theracane lent to pt- is helping some.    I spent a total of 40   minutes on total care today- >50% coordination of care- due to  D/w pt about CBGs; pain and spasms as well as myofascial pain- also d/w therapy.   LOS: 8 days A FACE TO FACE EVALUATION WAS PERFORMED  Mario [name] 01/01/2023, 8:10 AM

## 2023-01-01 NOTE — Progress Notes (Signed)
Physical Therapy Weekly Progress Note  Patient Details  Name: Mario [name] MRN: [id number] Date of Birth: [birth year]  Beginning of progress report period: December 25, 2022 End of progress report period: January 01, 2023  Today's Date: 01/01/2023 PT Individual Time: 1300-1415 PT Individual Time Calculation (min): 75 min   Patient has met 3 of 3 short term goals.  Pt is progressing very well toward LTGs. He is able to ambulate with RW and light min A up to 100 ft. He is initiating stair training, bed mobility with supervision and bed features. Pt received foot up brace today to assist with DF and foot clearance. Continues to be limited by spasticity and pain.   Patient continues to demonstrate the following deficits muscle weakness, impaired timing and sequencing, abnormal tone, and unbalanced muscle activation, and decreased standing balance and decreased balance strategies and therefore will continue to benefit from skilled PT intervention to increase functional independence with mobility.  Patient progressing toward long term goals..  Continue plan of care.  PT Short Term Goals Week 1:  PT Short Term Goal 1 (Week 1): Pt will ambulate >50 ft with assist PT Short Term Goal 1 - Progress (Week 1): Met PT Short Term Goal 2 (Week 1): Pt will progress to consistent min A STS and intemittent CGA PT Short Term Goal 2 - Progress (Week 1): Met PT Short Term Goal 3 (Week 1): Pt will self propel w/c throughout whole session. PT Short Term Goal 3 - Progress (Week 1): Met Week 2:  PT Short Term Goal 1 (Week 2): =LTGs d/t ELOS  Skilled Therapeutic Interventions/Progress Updates:  Ambulation/gait training;Discharge planning;DME/adaptive equipment instruction;Functional mobility training;Pain management;Psychosocial support;Splinting/orthotics;Therapeutic Activities;Visual/perceptual remediation/compensation;UE/LE Strength taining/ROM;Wheelchair propulsion/positioning;Stair training;Therapeutic  Exercise;UE/LE Coordination activities;Skin care/wound management;Patient/family education;Neuromuscular re-education;Functional electrical stimulation;Disease management/prevention;Community reintegration;Balance/vestibular training   Therapy Documentation Precautions:  Precautions Precautions: Back, Fall Precaution Comments: reviewed back precautions Required Braces or Orthoses: Spinal Brace Spinal Brace: Thoracolumbosacral orthotic, Applied in sitting position, Other (comment) Spinal Brace Comments: no brace ordered, but pt prefers for comfort Restrictions Weight Bearing Restrictions: No General:      Therapy/Group: Individual Therapy  [name] 01/01/2023, 3:56 PM

## 2023-01-01 NOTE — Progress Notes (Signed)
Physical Therapy Session Note  Patient Details  Name: Mario [name] MRN: [id number] Date of Birth: 1980-11-24  Today's Date: 01/01/2023 PT Individual Time: 1300-1415 PT Individual Time Calculation (min): 75 min   Short Term Goals: Week 1:  PT Short Term Goal 1 (Week 1): Pt will ambulate >50 ft with assist PT Short Term Goal 2 (Week 1): Pt will progress to consistent min A STS and intemittent CGA PT Short Term Goal 3 (Week 1): Pt will self propel w/c throughout whole session.  Skilled Therapeutic Interventions/Progress Updates:    pt received in bed and agreeable to therapy. Pt reports pain largely controlled on medication, but 1 instance of L side/shoulder pain when descending step up.  Bed mobility with supervision and bed features. Donned foot up brace after delivery.  Pt ambulated to/from main gym with RW and CGA-min a, w/c pulled behind for safety. Demoed altered gait pattern related to spasms/tightness, which was mildly better after pt performed seated hamstring and quad stretching. Educated on performing in w/c. Did not occ inversion, but pt largely able to correct. Cues for increased glute engagement and uipright posture with good improvement, cues for increased foot clearance with fatigue.   Pt performed 4 x 2 step ups on 6" step with BHR. Cues for increased eccentric control with LLE. Seated rest breaks for endurance.   2 x 6 Sit to stand on return to room with cues for eccentric control and pushing/reaching back with both hands.   Pt returned to w/c after session and remained with his family present.   Therapy Documentation Precautions:  Precautions Precautions: Back, Fall Precaution Comments: reviewed back precautions Required Braces or Orthoses: Spinal Brace Spinal Brace: Thoracolumbosacral orthotic, Applied in sitting position, Other (comment) Spinal Brace Comments: no brace ordered, but pt prefers for comfort Restrictions Weight Bearing Restrictions: No General:         Therapy/Group: Individual Therapy  Mario [name] 01/01/2023, 2:03 PM

## 2023-01-01 NOTE — Progress Notes (Signed)
Occupational Therapy Weekly Progress Note  Patient Details  Name: Mario [name] MRN: [id number] Date of Birth: Feb 19, 1981  Beginning of progress report period: December 25, 2022 End of progress report period: January 01, 2023  Patient has met 2 of 2 short term goals.  Pt is making steady progress with BADLs and functional transfers. Pt requires min A for bathing with sit<>stand from w/c at sink. Pt requires min/mod A for LB dressing tasks. Sit<>stand and standing balance with min A. Functional transfsers using RW with min A. Pt's wife has not been present for therapy sessions.  Patient continues to demonstrate the following deficits: muscle weakness, muscle joint tightness, and muscle paralysis, decreased cardiorespiratoy endurance, abnormal tone, unbalanced muscle activation, decreased coordination, and decreased motor planning, and decreased sitting balance, decreased standing balance, decreased postural control, and decreased balance strategies and therefore will continue to benefit from skilled OT intervention to enhance overall performance with BADL, iADL, Vocation, and Reduce care partner burden.  Patient progressing toward long term goals..  Continue plan of care.  OT Short Term Goals Week 1:  OT Short Term Goal 1 (Week 1): Pt will stand while grooming for >2:00 with AE as necessary with Min A OT Short Term Goal 1 - Progress (Week 1): Met OT Short Term Goal 2 (Week 1): Pt will complete bathing at overall Mod A with AE as necessary OT Short Term Goal 2 - Progress (Week 1): Met Week 2:  OT Short Term Goal 1 (Week 2): STG=LTG 2/2 ELOS   [name] 01/01/2023, 6:41 AM

## 2023-01-01 NOTE — Progress Notes (Signed)
Physical Therapy Session Note  Patient Details  Name: Mario [name] MRN: [id number] Date of Birth: 02/10/1981  Today's Date: 01/01/2023 PT Individual Time: 0903-1000 PT Individual Time Calculation (min): 57 min   Short Term Goals: Week 1:  PT Short Term Goal 1 (Week 1): Pt will ambulate >50 ft with assist PT Short Term Goal 2 (Week 1): Pt will progress to consistent min A STS and intemittent CGA PT Short Term Goal 3 (Week 1): Pt will self propel w/c throughout whole session.  Skilled Therapeutic Interventions/Progress Updates: Pt presented in w/c agreeable to therapy. Pt states pain "ok", unrated. Rest breaks provided as needed during session for fatigue. Pt propelled to day room with supervision for general conditioning. Performed ambulatory transfer to high/low mat with CGA. Performed Sit to stand x 5 from slightly elevated mat for BLE strengthening. Pt demonstrated improved technique as compared to yesterday with this therapist and decreased dependence of BUE. Pt then completed alternating toe taps x 3 on 4in step but pt noted to have significant difficulty consisting reaching 4in step without great effort therefore dropped to 2in step. Pt was able to complete 2 sets x 4 requiring seated rest due to fatigue. Pt then ambulated 58ft with RW and CGA. Pt continues to demonstrate mild ataxia RLE>LLE with poor toe clearance bilaterally. Pt and PTA discussed increased fatigue this am as pt had back to back therapies. Pt then ambulated to NuStep ~14ft in same manner as prior but with pt more aware of clearing B feet. Pt participated in NuStep L6 x 7 min for general conditioning then an additional x 3 min BLE for more focused LE strengthening/endurance. Once completed pt completed ambulatory transfer to w/c with RW and light minA. Pt transported back to room for time management and completed ambulatory transfer to bed with light minA. PTA assisted with doffing shoes to maintain spinal precautions. Completed  sit to supine transfer with supervision and increased time with cues to maintain spinal precautions as pt noted to twist when rotating back into bed. Pt repositioned to comfort and left with bed alarm on, call bell within reach and needs met.      Therapy Documentation Precautions:  Precautions Precautions: Back, Fall Precaution Comments: reviewed back precautions Required Braces or Orthoses: Spinal Brace Spinal Brace: Thoracolumbosacral orthotic, Applied in sitting position, Other (comment) Spinal Brace Comments: no brace ordered, but pt prefers for comfort Restrictions Weight Bearing Restrictions: No General:   Vital Signs:    Therapy/Group: Individual Therapy  Eliora Nienhuis 01/01/2023, 2:18 PM

## 2023-01-01 NOTE — Progress Notes (Signed)
Occupational Therapy Session Note  Patient Details  Name: Mario [name] MRN: [id number] Date of Birth: [birth year]  Today's Date: 01/01/2023 OT Individual Time: 4098-1191 OT Individual Time Calculation (min): 70 min    Short Term Goals: Week 2:  OT Short Term Goal 1 (Week 2): STG=LTG 2/2 ELOS  Skilled Therapeutic Interventions/Progress Updates:    Pt resting in bed upon arrival. Pain per below. OT intervention with focus on sit<>stand, standing balance, functional transfers, bathing/dressing w/c level, and safety awareness to increase independence with BADLs. Supine>sit EOB with supervision using bed rails. Sit<>stand with RW at supervision level. SPT with RW to w/c with CGA. Bathing/dressing with sit<>stand from w/c at sink. CGa when standing. Assist with donning socks/shoes without AE. Standing balance at sink with close supervision. Pt occasionally used BUE for bathing/dressing tasks. Pt remained in w/c with all needs within reach.   Therapy Documentation Precautions:  Precautions Precautions: Back, Fall Precaution Comments: reviewed back precautions Required Braces or Orthoses: Spinal Brace Spinal Brace: Thoracolumbosacral orthotic, Applied in sitting position, Other (comment) Spinal Brace Comments: no brace ordered, but pt prefers for comfort Restrictions Weight Bearing Restrictions: No   Pain: Pt c/o ongoing BLE spasms (MD aware) and Lt scap pain; soft tissue massage and kinesio tape applied  Therapy/Group: Individual Therapy  [name] 01/01/2023, 8:59 AM

## 2023-01-01 NOTE — Progress Notes (Signed)
Orthopedic Tech Progress Note Patient Details:  Mario [name] 18-Mar-1981 [id number] Called in order to [facility] for foot up Brace Patient ID: [name], male   DOB: [birth year], 42 y.o.   MRN: [id number]  Lovett Calender 01/01/2023, 12:09 PM

## 2023-01-02 DIAGNOSIS — G8222 Paraplegia, incomplete: Secondary | ICD-10-CM | POA: Diagnosis not present

## 2023-01-02 LAB — GLUCOSE, CAPILLARY
Glucose-Capillary: 112 mg/dL — ABNORMAL HIGH (ref 70–99)
Glucose-Capillary: 92 mg/dL (ref 70–99)

## 2023-01-02 NOTE — Evaluation (Signed)
Recreational Therapy Assessment and Plan  Patient Details  Name: Mario [name] MRN: [id number] Date of Birth: 1980-06-30 Today's Date: 01/02/2023  Rehab Potential:  Good ELOS:   d/c 9/20  Assessment Hospital Problem: Active Problems:   * No active hospital problems. *     Past Medical History:      Past Medical History:  Diagnosis Date   Bronchitis     Diabetes (HCC)     Diverticulitis 10/2015   Hypertension          Past Surgical History:       Past Surgical History:  Procedure Laterality Date   LUMBAR LAMINECTOMY/DECOMPRESSION MICRODISCECTOMY N/A 12/16/2022    Procedure: THORACIC DECOMPRESSION;  Surgeon: Estill Bamberg, MD;  Location: [facility] OR;  Service: Orthopedics;  Laterality: N/A;          Assessment & Plan Clinical Impression: Mario [name] is a 42 year old male with history of HTN, T2DM, diverticulitis, back pain/strain in May who started developing balance issues fall and numbness from waist down. He had seen NS with plans for surgery but was admitted on 12/14/22 with worsening of pain, increase in numbness and increase in BLE weakness w/difficulty walking. He was admitted for work up and evaluated by Dr. 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 UDS positive for opiates and cocaine. He underwent T7-T10 laminectomy with bilateral partial facetectomy and spinal cord decompression on 08/27.    Post op has had some improvement in weakness but continues to have R>LLE weakness and working on standing in [place]. Back brace ordered per patient preference. He continues to be limited by weakness, numbness from waist down as well as LE spasms. He was independent and working till 09/2022 but had steady decline needing cane to walker 3 weeks PTA--CIR  recommended due to functional decline.  Feels numbness from abdomen downward.  Patient transferred to CIR on 12/24/2022 .     Pt presents with decreased activity tolerance, decreased functional mobility, decreased balance, difficulty maintaining precautions Limiting pt's independence with leisure/community pursuits.  Met with pt today to discuss TR services including leisure education, activity analysis/modifications and stress management.  Also discussed the importance of social, emotional, spiritual health in addition to physical health and their effects on overall health and wellness.  Pt stated understanding.  Plan  Min 1 TR session >20 minutes during LOS  Recommendations for other services: None   Discharge Criteria: Patient will be discharged from TR if patient refuses treatment 3 consecutive times without medical reason.  If treatment goals not met, if there is a change in medical status, if patient makes no progress towards goals or if patient is discharged from hospital.  The above assessment, treatment plan, treatment alternatives and goals were discussed and mutually agreed upon: by patient  Mario [name] 01/02/2023, 12:11 PM

## 2023-01-02 NOTE — Progress Notes (Signed)
Physical Therapy Session Note  Patient Details  Name: Laramy Treichel MRN: [id number] Date of Birth: October 19, 1980  Today's Date: 01/02/2023 PT Individual Time: 1415-1530 PT Individual Time Calculation (min): 75 min   Short Term Goals: Week 2:  PT Short Term Goal 1 (Week 2): =LTGs d/t ELOS  Skilled Therapeutic Interventions/Progress Updates:    Pt recd in bathroom with his family present. Pt reports 8/10 pain, nsg provided medication, PT provided manual therapy, education, and ROM.   Min A for power up and balance for clothing management and mod A to walk to w/c with RW, hand hygiene at w/c level.   Pt propelled w/c to ortho gym for UE endurance. Pt participated in step taps on 4" step for single leg stance stability and hip flexion strength and coordination. Progressed to cross body reaching and throwing task while foot was place on step for balance challenge.   Pt then ambulated back to room with min a overall and one instance of slight knee buckling but no extra assist required. Cues for isolating hip motion  during swing phase vs using whole upper body to swing leg through.   Discussed the possibility of an outing with recreational therapy, pt agreeable.   Doffed shoes with assist, sit>supine with supervision, increased time and bed features.    Therapy Documentation Precautions:  Precautions Precautions: Back, Fall Precaution Comments: reviewed back precautions Required Braces or Orthoses: Spinal Brace Spinal Brace: Thoracolumbosacral orthotic, Applied in sitting position, Other (comment) Spinal Brace Comments: no brace ordered, but pt prefers for comfort Restrictions Weight Bearing Restrictions: No General:       Therapy/Group: Individual Therapy  [name] 01/02/2023, 2:56 PM

## 2023-01-02 NOTE — Progress Notes (Signed)
Physical Therapy Session Note  Patient Details  Name: Mario [name] MRN: [id number] Date of Birth: [birth year]  Today's Date: 01/02/2023 PT Individual Time: 8657-8469 PT Individual Time Calculation (min): 60 min   Short Term Goals: Week 2:  PT Short Term Goal 1 (Week 2): =LTGs d/t ELOS  Skilled Therapeutic Interventions/Progress Updates: Pt presented in w/c agreeable to therapy. Pt c/o pain at lower trap/mid mid back unrated, did not increase during session. PTA donned ankle component of foot up brace and pt stood with CGA from w/c and ambulated 143ft and 52 ft with RW and CGA with w/c follow to main gym. Pt required seated rest break between bouts due to fatigue. Pt explained that he's feeling more fatigued this am. Discussed increased workload from yesterday and may still be fatigued from yesterday's activities. Pt then completed coordination activity performing toe taps to target with 4lb weights on BLE for improved sensory input. Pt also completed standing dynamic balance reaching and placing clothespins on basketball net using single UE support x 2. Pt noted to have mild intermitterent knee instability however no LOB noted. Pt completed transfer to w/c and propelled back to room with supervision. Pt completed ambulatory transfer back to bed with RW and CGA. PTA assisted in doffing shoes and pt completed sit to supine with supervision and use of bed features. Pt left in bed at end of session with call bell within reach and needs met.      Therapy Documentation Precautions:  Precautions Precautions: Back, Fall Precaution Comments: reviewed back precautions Required Braces or Orthoses: Spinal Brace Spinal Brace: Thoracolumbosacral orthotic, Applied in sitting position, Other (comment) Spinal Brace Comments: no brace ordered, but pt prefers for comfort Restrictions Weight Bearing Restrictions: No General:   Vital Signs:   Pain: Pain Assessment Pain Scale: 0-10 Pain Score: 0-No  pain Pain Location: Back Pain Intervention(s): Medication (See eMAR)  Therapy/Group: Individual Therapy  Keamber Macfadden 01/02/2023, 12:26 PM

## 2023-01-02 NOTE — Progress Notes (Signed)
Occupational Therapy Session Note  Patient Details  Name: Mario [name] MRN: [id number] Date of Birth: 1980-09-29  Today's Date: 01/02/2023 OT Individual Time: 4098-1191 OT Individual Time Calculation (min): 72 min    Short Term Goals: Week 2:  OT Short Term Goal 1 (Week 2): STG=LTG 2/2 ELOS  Skilled Therapeutic Interventions/Progress Updates:    Pt resting in bed upon arrival. Pt initially elected to bathe at sink but also requested to use toilet. Stand pivot transfers with CGA. After using toilet pt decided he wanted to shower. Bathing at shower level with CGA/min A. Dressing with sit<>stand from w/c at sink with CGA. Pt able to stand at sink and use BUE to fasten pants. Pt pleased with this progress. All sit<>stand with CGA. Pt equires assistance donning socks and shoes without AE. Pt remained seated in w/c with all needs within reach.   Therapy Documentation Precautions:  Precautions Precautions: Back, Fall Precaution Comments: reviewed back precautions Required Braces or Orthoses: Spinal Brace Spinal Brace: Thoracolumbosacral orthotic, Applied in sitting position, Other (comment) Spinal Brace Comments: no brace ordered, but pt prefers for comfort Restrictions Weight Bearing Restrictions: No Pain:  Pt reports spasms and Rt scapula pain (unrated); shower and soft tissue massage    Therapy/Group: Individual Therapy  [name] 01/02/2023, 8:15 AM

## 2023-01-02 NOTE — Progress Notes (Signed)
PROGRESS NOTE   Subjective/Complaints:  Pt reports spasms good/stable Doing pretty well overall Got his Foot up AFO yesterday- works well.  LBM overnight Still having neck/shoulder/back  pain- still painful- hard to use theracane in bed or in w/c.    ROS:    Pt denies SOB, abd pain, CP, N/V/C/D, and vision changes   Except for HPI  Objective:   No results found. Recent Labs    01/01/23 0819  WBC 6.7  HGB 13.8  HCT 41.6  PLT 438*     Recent Labs    01/01/23 0819  NA 138  K 4.0  CL 101  CO2 26  GLUCOSE 132*  BUN 8  CREATININE 0.88  CALCIUM 9.5      Intake/Output Summary (Last 24 hours) at 01/02/2023 0827 Last data filed at 01/02/2023 0150 Gross per 24 hour  Intake 720 ml  Output 750 ml  Net -30 ml        Physical Exam: Vital Signs Blood pressure 122/67, pulse 61, temperature 98.1 F (36.7 C), resp. rate 16, height 6\' 1"  (1.854 m), weight 126.6 kg, SpO2 97%.      General: awake, alert, appropriate, supine in bed; c/o myofascial pain NAD HENT: conjugate gaze; oropharynx moist CV: regular rate; no JVD Pulmonary: CTA B/L; no W/R/R- good air movement GI: soft, NT, ND, (+)BS Psychiatric: appropriate- just woke up- but pleasant Neurological: Ox3-sleepy  Spasms much better controlled- no spontaneous spasms-stable Psych: Pt's affect is appropriate. Pt is cooperative Skin: Clean and intact without signs of breakdown. Back incision CDI--not reassessed today  PRIOR EXAMS: Neuro:  Alert and oriented x 3. Normal insight and awareness. Intact Memory. Normal language and speech. Cranial nerve exam unremarkable. MMT: UE motor 5/5. LLE 3- to 3/5 HF, KE and 1+ PF and tr-1 ADF. RLE: 3- to 3/5 HF, KE and 1 APF, tr ADF.  T8 sensory level. Can sense LT and pain in both legs, lesser on feet?, DTR's 1+, no resting tone today. No clonus appreciated Musculoskeletal: back in TLSO, tender with sitting.      Assessment/Plan: 1. Functional deficits which require 3+ hours per day of interdisciplinary therapy in a comprehensive inpatient rehab setting. Physiatrist is providing close team supervision and 24 hour management of active medical problems listed below. Physiatrist and rehab team continue to assess barriers to discharge/monitor patient progress toward functional and medical goals  Care Tool:  Bathing    Body parts bathed by patient: Right arm, Face, Left arm, Chest, Abdomen, Right upper leg, Left upper leg, Front perineal area, Buttocks, Right lower leg, Left lower leg   Body parts bathed by helper: Right lower leg, Left lower leg     Bathing assist Assist Level: Contact Guard/Touching assist     Upper Body Dressing/Undressing Upper body dressing   What is the patient wearing?: Pull over shirt    Upper body assist Assist Level: Independent    Lower Body Dressing/Undressing Lower body dressing      What is the patient wearing?: Underwear/pull up, Pants     Lower body assist Assist for lower body dressing: Contact Guard/Touching assist     Toileting Toileting    Toileting  assist Assist for toileting: Minimal Assistance - Patient > 75%     Transfers Chair/bed transfer  Transfers assist     Chair/bed transfer assist level: Moderate Assistance - Patient 50 - 74%     Locomotion Ambulation   Ambulation assist      Assist level: 2 helpers Assistive device: Walker-rolling Max distance: 42 ft   Walk 10 feet activity   Assist     Assist level: 2 helpers Assistive device: Walker-rolling   Walk 50 feet activity   Assist Walk 50 feet with 2 turns activity did not occur: Safety/medical concerns         Walk 150 feet activity   Assist Walk 150 feet activity did not occur: Safety/medical concerns         Walk 10 feet on uneven surface  activity   Assist Walk 10 feet on uneven surfaces activity did not occur: Safety/medical  concerns         Wheelchair     Assist Is the patient using a wheelchair?: Yes Type of Wheelchair: Manual    Wheelchair assist level: Supervision/Verbal cueing Max wheelchair distance: 100    Wheelchair 50 feet with 2 turns activity    Assist        Assist Level: Supervision/Verbal cueing   Wheelchair 150 feet activity     Assist      Assist Level: Minimal Assistance - Patient > 75%   Blood pressure 122/67, pulse 61, temperature 98.1 F (36.7 C), resp. rate 16, height 6\' 1"  (1.854 m), weight 126.6 kg, SpO2 97%.  Medical Problem List and Plan: 1. Functional deficits secondary to T7 thoracic spinal cord injury             -patient may shower             -ELOS/Goals: modI 10-14 days            D/c 9/20  Con't CIR PT and OT D/w therapy about myofascial release- will likely do trigger point injections next week  -pt given grounds pass 2.  Antithrombotics: -DVT/anticoagulation:  DVT study neg 12/27/22; Mechanical: Sequential compression devices, below knee Bilateral lower extremities; now on lovenox 30mg  q12h             9/13- will go home on Eliquis if not walking >300 ft at a time 3. Pain Management:  PRN oxycodone seems to be helping to control pain with activity  9/12- pain worse this AM but thinks overdid it yesterday- taking ~ 3-4x in 24 hours- usually 10 mg   4. Mood/Behavior/Sleep: LCSW to follow for evaluation and support.              -antipsychotic agents: N/A             --trazodone prn for insomnia.   -12/27/22 added melatonin 5mg  QHS PRN to help with sleep-improved  9/11- Didn't sleep well- will make sure has trazodone prn- take by 11pm  9/13- sleeping well 5. Neuropsych/cognition: This patient is capable of making decisions on his own behalf. 6. Skin/Wound Care: Routine pressure relief measures. 7. Fluids/Electrolytes/Nutrition: encourage PO  -mild hyponatremia--will recheck on Monday. No obvious causes at this time  9/9- Na 136 this AM 8.  HTN: Monitor BP TID--continue avapro 75mg  daily for diovan  -9/7-8/24 BPs doing good, cont regimen  9/9- BP's a little on high side- 120s-140s systolic- wil monitor for trend- make sure, although unlikely, that's it's not AD.  9/13- BP controlled- con't regimen Vitals:  12/29/22 1309 12/29/22 1842 12/30/22 0611 12/30/22 1540  BP: 136/88 (!) 140/78 125/86 127/67   12/30/22 2027 12/31/22 0500 12/31/22 1313 12/31/22 2002  BP: 126/70 126/75 121/70 139/86   01/01/23 0429 01/01/23 1422 01/01/23 1945 01/02/23 0652  BP: (!) 142/81 136/89 131/73 122/67    9. T2DM: Hgb A1c 6.7- - Monitor BS ac/hs and use SSI for elevated BS             --continue metformin XR 500mg  daily  -9/11- will reduce CBGs to BID before breakfast and lunch  9/12- had BG of 79-83 yesterday afternoon/evening- was given OJ just in case  9/13- Cbgs looking better- will likely just go home on Metformin long acting CBG (last 3)  Recent Labs    01/01/23 0611 01/01/23 1121 01/02/23 0621  GLUCAP 123* 94 112*     10. Neurogenic bowel: - Enema administered with only small result-->KUB showed moderate to large stool burden.              --continue Senna S to 2 tabs bid -schedule miralax starting tomorrow -9/6 give sorbitol 60cc today, SSE if needed in pm  -12/28/22 LBM last night, moving bowels better, cont regimen 9/9- LBM 1-2 days ago, however getting to bathroom this AM to have BM with OT 9/10- LBM yesterday 9/11- LBM yesterday 9/12- going most days in AM, but not so far this AM- LBM yesterday 9/13- LBM this AM 11. Spasticity: continue baclofen 5mg  QID--this dose seems to be working for him without causing too much fatigue or weakness-- getting it nightly now, and TID PRN.    9/9- pt reports that was getting 10 mg QID, however felt too weak- ok with scheduling Baclofen since spasms out of control on prn/at bedtime- changed to 5 mg QID  9/10- will add Zanaflex 2 mg TID and another 2 mg at bedtime to help spasms- my concern is  that pt might be developing an illness- sometimes spike in spasms can be from infection brewing, but he feels well right now- educated pt on this concept.   9/11- MUCH better today-   9/12- spasms doing better with addition/increase of Zanaflex 12. Myofascial pain  9/11- having pain in lateral scapula/teres- will get therapy to do myofascial release due to trigger point injections  9/12- Theracane lent to pt- is helping some. 9/13- pt asking for more myofasical release- d/w OT    I spent a total of 42   minutes on total care today- >50% coordination of care- due to  Education of pt about theracane- and d/w therapy about myofasical release for pt- also d/w nursing  LOS: 9 days A FACE TO FACE EVALUATION WAS PERFORMED  Virgilene Stryker 01/02/2023, 8:27 AM

## 2023-01-02 NOTE — Plan of Care (Signed)
  Problem: Consults Goal: RH GENERAL PATIENT EDUCATION Description: See Patient Education module for education specifics. Outcome: Progressing   Problem: RH BOWEL ELIMINATION Goal: RH STG MANAGE BOWEL WITH ASSISTANCE Description: STG Manage Bowel with supervision Assistance. Outcome: Progressing Goal: RH STG MANAGE BOWEL W/MEDICATION W/ASSISTANCE Description: STG Manage Bowel with Medication with min Assistance. Outcome: Progressing   Problem: RH SKIN INTEGRITY Goal: RH STG MAINTAIN SKIN INTEGRITY WITH ASSISTANCE Description: STG Maintain Skin Integrity With supervision Assistance. Outcome: Progressing   Problem: RH SAFETY Goal: RH STG ADHERE TO SAFETY PRECAUTIONS W/ASSISTANCE/DEVICE Description: STG Adhere to Safety Precautions With cueing Assistance/Device. Outcome: Progressing Goal: RH STG DECREASED RISK OF FALL WITH ASSISTANCE Description: STG Decreased Risk of Fall With cueing Assistance. Outcome: Progressing   Problem: RH PAIN MANAGEMENT Goal: RH STG PAIN MANAGED AT OR BELOW PT'S PAIN GOAL Description: Pain will be managed less than 4 on pain scale with PRN medications min assist  Outcome: Progressing   Problem: RH KNOWLEDGE DEFICIT GENERAL Goal: RH STG INCREASE KNOWLEDGE OF SELF CARE AFTER HOSPITALIZATION Description: Patient/caregiver will be able to manage medications and self care from nursing education and nursing handouts independently  Outcome: Progressing

## 2023-01-03 DIAGNOSIS — R739 Hyperglycemia, unspecified: Secondary | ICD-10-CM | POA: Diagnosis not present

## 2023-01-03 DIAGNOSIS — K5901 Slow transit constipation: Secondary | ICD-10-CM | POA: Diagnosis not present

## 2023-01-03 DIAGNOSIS — M4714 Other spondylosis with myelopathy, thoracic region: Secondary | ICD-10-CM | POA: Diagnosis not present

## 2023-01-03 DIAGNOSIS — I10 Essential (primary) hypertension: Secondary | ICD-10-CM | POA: Diagnosis not present

## 2023-01-03 LAB — GLUCOSE, CAPILLARY
Glucose-Capillary: 111 mg/dL — ABNORMAL HIGH (ref 70–99)
Glucose-Capillary: 132 mg/dL — ABNORMAL HIGH (ref 70–99)

## 2023-01-03 NOTE — Plan of Care (Signed)
  Problem: Consults Goal: RH GENERAL PATIENT EDUCATION Description: See Patient Education module for education specifics. Outcome: Progressing   Problem: RH BOWEL ELIMINATION Goal: RH STG MANAGE BOWEL WITH ASSISTANCE Description: STG Manage Bowel with supervision Assistance. Outcome: Progressing Goal: RH STG MANAGE BOWEL W/MEDICATION W/ASSISTANCE Description: STG Manage Bowel with Medication with min Assistance. Outcome: Progressing   Problem: RH SKIN INTEGRITY Goal: RH STG MAINTAIN SKIN INTEGRITY WITH ASSISTANCE Description: STG Maintain Skin Integrity With supervision Assistance. Outcome: Progressing   Problem: RH SAFETY Goal: RH STG ADHERE TO SAFETY PRECAUTIONS W/ASSISTANCE/DEVICE Description: STG Adhere to Safety Precautions With cueing Assistance/Device. Outcome: Progressing Goal: RH STG DECREASED RISK OF FALL WITH ASSISTANCE Description: STG Decreased Risk of Fall With cueing Assistance. Outcome: Progressing   Problem: RH PAIN MANAGEMENT Goal: RH STG PAIN MANAGED AT OR BELOW PT'S PAIN GOAL Description: Pain will be managed less than 4 on pain scale with PRN medications min assist  Outcome: Progressing   Problem: RH KNOWLEDGE DEFICIT GENERAL Goal: RH STG INCREASE KNOWLEDGE OF SELF CARE AFTER HOSPITALIZATION Description: Patient/caregiver will be able to manage medications and self care from nursing education and nursing handouts independently  Outcome: Progressing

## 2023-01-03 NOTE — Progress Notes (Signed)
PROGRESS NOTE   Subjective/Complaints:  Pt doing well, slept ok, pain managed fairly well, LBM this morning, urinating fine. Denies any other complaints or concerns today.     ROS:    Pt denies SOB, abd pain, CP, N/V/C/D, and vision changes   Except for HPI  Objective:   No results found. Recent Labs    01/01/23 0819  WBC 6.7  HGB 13.8  HCT 41.6  PLT 438*     Recent Labs    01/01/23 0819  NA 138  K 4.0  CL 101  CO2 26  GLUCOSE 132*  BUN 8  CREATININE 0.88  CALCIUM 9.5      Intake/Output Summary (Last 24 hours) at 01/03/2023 1033 Last data filed at 01/02/2023 1747 Gross per 24 hour  Intake 596 ml  Output --  Net 596 ml        Physical Exam: Vital Signs Blood pressure 133/69, pulse 74, temperature 97.8 F (36.6 C), resp. rate 17, height 6\' 1"  (1.854 m), weight 126.6 kg, SpO2 97%.   General: awake, alert, appropriate, up in stedy; NAD HENT: conjugate gaze; oropharynx moist CV: regular rate and rhythm; no JVD Pulmonary: CTA B/L; no W/R/R- good air movement GI: soft, NT, ND, (+)BS Psychiatric: appropriate and cooperative Neurological: Ox3 Skin: Clean and intact without signs of breakdown. Back incision CDI--not reassessed today  PRIOR EXAMS: Neuro:  Alert and oriented x 3. Normal insight and awareness. Intact Memory. Normal language and speech. Cranial nerve exam unremarkable. MMT: UE motor 5/5. LLE 3- to 3/5 HF, KE and 1+ PF and tr-1 ADF. RLE: 3- to 3/5 HF, KE and 1 APF, tr ADF.  T8 sensory level. Can sense LT and pain in both legs, lesser on feet?, DTR's 1+, no resting tone today. No clonus appreciated Musculoskeletal: back in TLSO, tender with sitting.   Spasms much better controlled- no spontaneous spasms-stable   Assessment/Plan: 1. Functional deficits which require 3+ hours per day of interdisciplinary therapy in a comprehensive inpatient rehab setting. Physiatrist is providing close  team supervision and 24 hour management of active medical problems listed below. Physiatrist and rehab team continue to assess barriers to discharge/monitor patient progress toward functional and medical goals  Care Tool:  Bathing    Body parts bathed by patient: Right arm, Face, Left arm, Chest, Abdomen, Right upper leg, Left upper leg, Front perineal area, Buttocks, Right lower leg, Left lower leg   Body parts bathed by helper: Right lower leg, Left lower leg     Bathing assist Assist Level: Contact Guard/Touching assist     Upper Body Dressing/Undressing Upper body dressing   What is the patient wearing?: Pull over shirt    Upper body assist Assist Level: Independent    Lower Body Dressing/Undressing Lower body dressing      What is the patient wearing?: Underwear/pull up, Pants     Lower body assist Assist for lower body dressing: Contact Guard/Touching assist     Toileting Toileting    Toileting assist Assist for toileting: Minimal Assistance - Patient > 75%     Transfers Chair/bed transfer  Transfers assist     Chair/bed transfer assist level: Moderate Assistance -  Patient 50 - 74%     Locomotion Ambulation   Ambulation assist      Assist level: 2 helpers Assistive device: Walker-rolling Max distance: 42 ft   Walk 10 feet activity   Assist     Assist level: 2 helpers Assistive device: Walker-rolling   Walk 50 feet activity   Assist Walk 50 feet with 2 turns activity did not occur: Safety/medical concerns         Walk 150 feet activity   Assist Walk 150 feet activity did not occur: Safety/medical concerns         Walk 10 feet on uneven surface  activity   Assist Walk 10 feet on uneven surfaces activity did not occur: Safety/medical concerns         Wheelchair     Assist Is the patient using a wheelchair?: Yes Type of Wheelchair: Manual    Wheelchair assist level: Supervision/Verbal cueing Max wheelchair  distance: 100    Wheelchair 50 feet with 2 turns activity    Assist        Assist Level: Supervision/Verbal cueing   Wheelchair 150 feet activity     Assist      Assist Level: Minimal Assistance - Patient > 75%   Blood pressure 133/69, pulse 74, temperature 97.8 F (36.6 C), resp. rate 17, height 6\' 1"  (1.854 m), weight 126.6 kg, SpO2 97%.  Medical Problem List and Plan: 1. Functional deficits secondary to T7 thoracic spinal cord injury             -patient may shower             -ELOS/Goals: modI 10-14 days            D/c 9/20  Con't CIR PT and OT D/w therapy about myofascial release- will likely do trigger point injections next week  -pt given grounds pass 2.  Antithrombotics: -DVT/anticoagulation:  DVT study neg 12/27/22; Mechanical: Sequential compression devices, below knee Bilateral lower extremities; now on lovenox 30mg  q12h             9/13- will go home on Eliquis if not walking >300 ft at a time 3. Pain Management:  PRN oxycodone seems to be helping to control pain with activity  9/12- pain worse this AM but thinks overdid it yesterday- taking ~ 3-4x in 24 hours- usually 10 mg   4. Mood/Behavior/Sleep: LCSW to follow for evaluation and support.              -antipsychotic agents: N/A             --trazodone prn for insomnia.   -12/27/22 added melatonin 5mg  QHS PRN to help with sleep-improved  9/11- Didn't sleep well- will make sure has trazodone prn- take by 11pm  01/03/23- sleeping well 5. Neuropsych/cognition: This patient is capable of making decisions on his own behalf. 6. Skin/Wound Care: Routine pressure relief measures. 7. Fluids/Electrolytes/Nutrition: encourage PO  -mild hyponatremia--will recheck on Monday. No obvious causes at this time  9/9- Na 136 this AM 8. HTN: Monitor BP TID--continue avapro 75mg  daily for diovan  -9/7-8/24 BPs doing good, cont regimen  9/9- BP's a little on high side- 120s-140s systolic- wil monitor for trend- make sure,  although unlikely, that's it's not AD.  -9/13-14- BP controlled- con't regimen Vitals:   12/29/22 1842 12/30/22 0611 12/30/22 1540 12/30/22 2027  BP: (!) 140/78 125/86 127/67 126/70   12/31/22 0500 12/31/22 1313 12/31/22 2002 01/01/23 0429  BP: 126/75 121/70 139/86 Marland Kitchen)  142/81   01/01/23 1422 01/01/23 1945 01/02/23 0652 01/03/23 0429  BP: 136/89 131/73 122/67 133/69    9. T2DM: Hgb A1c 6.7- - Monitor BS ac/hs and use SSI for elevated BS             --continue metformin XR 500mg  daily  -9/11- will reduce CBGs to BID before breakfast and lunch  9/12- had BG of 79-83 yesterday afternoon/evening- was given OJ just in case  9/13- Cbgs looking better- will likely just go home on Metformin long acting  -01/03/23 CBGs looking great, hasn't needed insulin since 9/12-monitor CBG (last 3)  Recent Labs    01/02/23 0621 01/02/23 1147 01/03/23 0612  GLUCAP 112* 92 111*     10. Neurogenic bowel: - Enema administered with only small result-->KUB showed moderate to large stool burden.              --continue Senna S to 2 tabs bid -schedule miralax starting tomorrow -9/6 give sorbitol 60cc today, SSE if needed in pm  -12/28/22 LBM last night, moving bowels better, cont regimen 9/9- LBM 1-2 days ago, however getting to bathroom this AM to have BM with OT 9/10- LBM yesterday 9/11- LBM yesterday 9/12- going most days in AM, but not so far this AM- LBM yesterday 01/03/23- LBM this AM 11. Spasticity: continue baclofen 5mg  QID--this dose seems to be working for him without causing too much fatigue or weakness-- getting it nightly now, and TID PRN.    9/9- pt reports that was getting 10 mg QID, however felt too weak- ok with scheduling Baclofen since spasms out of control on prn/at bedtime- changed to 5 mg QID  9/10- will add Zanaflex 2 mg TID and another 2 mg at bedtime to help spasms- my concern is that pt might be developing an illness- sometimes spike in spasms can be from infection brewing, but he feels  well right now- educated pt on this concept.   9/11- MUCH better today-   9/12- spasms doing better with addition/increase of Zanaflex 12. Myofascial pain  9/11- having pain in lateral scapula/teres- will get therapy to do myofascial release due to trigger point injections  9/12- Theracane lent to pt- is helping some. 9/13- pt asking for more myofasical release- d/w OT     LOS: 10 days A FACE TO FACE EVALUATION WAS PERFORMED  [address] 01/03/2023, 10:33 AM

## 2023-01-04 DIAGNOSIS — R739 Hyperglycemia, unspecified: Secondary | ICD-10-CM | POA: Diagnosis not present

## 2023-01-04 DIAGNOSIS — I10 Essential (primary) hypertension: Secondary | ICD-10-CM | POA: Diagnosis not present

## 2023-01-04 DIAGNOSIS — K5901 Slow transit constipation: Secondary | ICD-10-CM | POA: Diagnosis not present

## 2023-01-04 DIAGNOSIS — M4714 Other spondylosis with myelopathy, thoracic region: Secondary | ICD-10-CM | POA: Diagnosis not present

## 2023-01-04 LAB — GLUCOSE, CAPILLARY
Glucose-Capillary: 109 mg/dL — ABNORMAL HIGH (ref 70–99)
Glucose-Capillary: 108 mg/dL — ABNORMAL HIGH (ref 70–99)

## 2023-01-04 NOTE — Progress Notes (Signed)
Occupational Therapy Session Note  Patient Details  Name: Altay Jackett MRN: [id number] Date of Birth: 01-16-1981  Today's Date: 01/04/2023 OT Individual Time: 4098-1191 OT Individual Time Calculation (min): 73 min    Short Term Goals: Week 2:  OT Short Term Goal 1 (Week 2): STG=LTG 2/2 ELOS  Skilled Therapeutic Interventions/Progress Updates:    Pt resting in bed upon arrival. OT intervention with focus on bed mobility, sit<>stand, standing balance, bathing at shower level, dressing with sit<>stand from w/c, functional transfers, and therex to increase independence with BADLs. All stand pivot transfers with CGA. Sit<>stand with supervision. Bathing/dressing with CGA. Standing balance with CGA. Pt requires assistance with donning socks/shoes without AE. 7 mins NuStep level 6 and 3 mins level 3 BLE only. Pt remained in w/c with all needs within reach.   Therapy Documentation Precautions:  Precautions Precautions: Back, Fall Precaution Comments: reviewed back precautions Required Braces or Orthoses: Spinal Brace Spinal Brace: Thoracolumbosacral orthotic, Applied in sitting position, Other (comment) Spinal Brace Comments: no brace ordered, but pt prefers for comfort Restrictions Weight Bearing Restrictions: No   Pain: Pt reports ongoing BLE spasms (primarily in the morning), pain is "ok"  Therapy/Group: Individual Therapy  [name] 01/04/2023, 9:34 AM

## 2023-01-04 NOTE — Progress Notes (Signed)
PROGRESS NOTE   Subjective/Complaints:  Pt doing well again today, slept well, pain managed well, LBM this morning, urinating fine. Denies any other complaints or concerns today.     ROS:    Pt denies SOB, abd pain, CP, N/V/C/D, and vision changes   Except for HPI  Objective:   No results found. No results for input(s): "WBC", "HGB", "HCT", "PLT" in the last 72 hours.    No results for input(s): "NA", "K", "CL", "CO2", "GLUCOSE", "BUN", "CREATININE", "CALCIUM" in the last 72 hours.     Intake/Output Summary (Last 24 hours) at 01/04/2023 1148 Last data filed at 01/04/2023 0845 Gross per 24 hour  Intake 1077 ml  Output --  Net 1077 ml        Physical Exam: Vital Signs Blood pressure (!) 127/56, pulse 70, temperature 97.7 F (36.5 C), temperature source Oral, resp. rate 18, height 6\' 1"  (1.854 m), weight 126.6 kg, SpO2 99%.   General: awake, alert, appropriate, up in w/c eating breakfast; NAD HENT: conjugate gaze; oropharynx moist CV: regular rate and rhythm; no JVD Pulmonary: CTA B/L; no W/R/R- good air movement GI: soft, NT, ND, (+)BS Psychiatric: appropriate and cooperative Neurological: Ox3 Skin: Clean and intact without signs of breakdown. Back incision CDI--not reassessed today  PRIOR EXAMS: Neuro:  Alert and oriented x 3. Normal insight and awareness. Intact Memory. Normal language and speech. Cranial nerve exam unremarkable. MMT: UE motor 5/5. LLE 3- to 3/5 HF, KE and 1+ PF and tr-1 ADF. RLE: 3- to 3/5 HF, KE and 1 APF, tr ADF.  T8 sensory level. Can sense LT and pain in both legs, lesser on feet?, DTR's 1+, no resting tone today. No clonus appreciated Musculoskeletal: back in TLSO, tender with sitting.   Spasms much better controlled- no spontaneous spasms-stable   Assessment/Plan: 1. Functional deficits which require 3+ hours per day of interdisciplinary therapy in a comprehensive inpatient rehab  setting. Physiatrist is providing close team supervision and 24 hour management of active medical problems listed below. Physiatrist and rehab team continue to assess barriers to discharge/monitor patient progress toward functional and medical goals  Care Tool:  Bathing    Body parts bathed by patient: Right arm, Face, Left arm, Chest, Abdomen, Right upper leg, Left upper leg, Front perineal area, Buttocks, Right lower leg, Left lower leg   Body parts bathed by helper: Right lower leg, Left lower leg     Bathing assist Assist Level: Contact Guard/Touching assist     Upper Body Dressing/Undressing Upper body dressing   What is the patient wearing?: Pull over shirt    Upper body assist Assist Level: Independent    Lower Body Dressing/Undressing Lower body dressing      What is the patient wearing?: Underwear/pull up, Pants     Lower body assist Assist for lower body dressing: Contact Guard/Touching assist     Toileting Toileting    Toileting assist Assist for toileting: Minimal Assistance - Patient > 75%     Transfers Chair/bed transfer  Transfers assist     Chair/bed transfer assist level: Moderate Assistance - Patient 50 - 74%     Locomotion Ambulation   Ambulation assist  Assist level: 2 helpers Assistive device: Walker-rolling Max distance: 42 ft   Walk 10 feet activity   Assist     Assist level: 2 helpers Assistive device: Walker-rolling   Walk 50 feet activity   Assist Walk 50 feet with 2 turns activity did not occur: Safety/medical concerns         Walk 150 feet activity   Assist Walk 150 feet activity did not occur: Safety/medical concerns         Walk 10 feet on uneven surface  activity   Assist Walk 10 feet on uneven surfaces activity did not occur: Safety/medical concerns         Wheelchair     Assist Is the patient using a wheelchair?: Yes Type of Wheelchair: Manual    Wheelchair assist level:  Supervision/Verbal cueing Max wheelchair distance: 100    Wheelchair 50 feet with 2 turns activity    Assist        Assist Level: Supervision/Verbal cueing   Wheelchair 150 feet activity     Assist      Assist Level: Minimal Assistance - Patient > 75%   Blood pressure (!) 127/56, pulse 70, temperature 97.7 F (36.5 C), temperature source Oral, resp. rate 18, height 6\' 1"  (1.854 m), weight 126.6 kg, SpO2 99%.  Medical Problem List and Plan: 1. Functional deficits secondary to T7 thoracic spinal cord injury             -patient may shower             -ELOS/Goals: modI 10-14 days            D/c 9/20  Con't CIR PT and OT D/w therapy about myofascial release- will likely do trigger point injections next week  -pt given grounds pass 2.  Antithrombotics: -DVT/anticoagulation:  DVT study neg 12/27/22; Mechanical: Sequential compression devices, below knee Bilateral lower extremities; now on lovenox 30mg  q12h             -9/13- will go home on Eliquis if not walking >300 ft at a time 3. Pain Management:  PRN oxycodone seems to be helping to control pain with activity  -9/12- pain worse this AM but thinks overdid it yesterday- taking ~ 3-4x in 24 hours- usually 10 mg   4. Mood/Behavior/Sleep: LCSW to follow for evaluation and support.              -antipsychotic agents: N/A             --trazodone prn for insomnia.   -12/27/22 added melatonin 5mg  QHS PRN to help with sleep-improved  9/11- Didn't sleep well- will make sure has trazodone prn- take by 11pm  01/03/23- sleeping well 5. Neuropsych/cognition: This patient is capable of making decisions on his own behalf. 6. Skin/Wound Care: Routine pressure relief measures. 7. Fluids/Electrolytes/Nutrition: encourage PO  -mild hyponatremia--will recheck on Monday. No obvious causes at this time  9/9- Na 136 this AM 8. HTN: Monitor BP TID--continue avapro 75mg  daily for diovan  -9/7-8/24 BPs doing good, cont regimen  9/9- BP's a  little on high side- 120s-140s systolic- wil monitor for trend- make sure, although unlikely, that's it's not AD.  -9/13-15- BP controlled- con't regimen Vitals:   12/30/22 2027 12/31/22 0500 12/31/22 1313 12/31/22 2002  BP: 126/70 126/75 121/70 139/86   01/01/23 0429 01/01/23 1422 01/01/23 1945 01/02/23 0652  BP: (!) 142/81 136/89 131/73 122/67   01/03/23 0429 01/03/23 1320 01/03/23 2003 01/04/23 0415  BP: 133/69 129/68  135/89 (!) 127/56    9. T2DM: Hgb A1c 6.7- - Monitor BS ac/hs and use SSI for elevated BS             --continue metformin XR 500mg  daily  -9/11- will reduce CBGs to BID before breakfast and lunch  9/12- had BG of 79-83 yesterday afternoon/evening- was given OJ just in case  9/13- Cbgs looking better- will likely just go home on Metformin long acting  -01/03/23 CBGs looking great, hasn't needed insulin since 9/12-monitor  -01/04/23 CBGs great again, needed 1U novolog yesterday afternoon CBG (last 3)  Recent Labs    01/03/23 1125 01/04/23 0557 01/04/23 1132  GLUCAP 132* 109* 108*     10. Neurogenic bowel: - Enema administered with only small result-->KUB showed moderate to large stool burden.              --continue Senna S to 2 tabs bid -schedule miralax starting tomorrow -9/6 give sorbitol 60cc today, SSE if needed in pm  -12/28/22 LBM last night, moving bowels better, cont regimen 9/9- LBM 1-2 days ago, however getting to bathroom this AM to have BM with OT 9/10- LBM yesterday 9/11- LBM yesterday 9/12- going most days in AM, but not so far this AM- LBM yesterday 01/04/23- LBM this AM 11. Spasticity: continue baclofen 5mg  QID--this dose seems to be working for him without causing too much fatigue or weakness-- getting it nightly now, and TID PRN.    9/9- pt reports that was getting 10 mg QID, however felt too weak- ok with scheduling Baclofen since spasms out of control on prn/at bedtime- changed to 5 mg QID  9/10- will add Zanaflex 2 mg TID and another 2 mg at  bedtime to help spasms- my concern is that pt might be developing an illness- sometimes spike in spasms can be from infection brewing, but he feels well right now- educated pt on this concept.   9/11- MUCH better today-   9/12- spasms doing better with addition/increase of Zanaflex 12. Myofascial pain  9/11- having pain in lateral scapula/teres- will get therapy to do myofascial release due to trigger point injections  9/12- Theracane lent to pt- is helping some. 9/13- pt asking for more myofasical release- d/w OT     LOS: 11 days A FACE TO FACE EVALUATION WAS PERFORMED  [address] 01/04/2023, 11:48 AM

## 2023-01-05 DIAGNOSIS — G8222 Paraplegia, incomplete: Secondary | ICD-10-CM | POA: Diagnosis not present

## 2023-01-05 LAB — CBC
HCT: 39.5 % (ref 39.0–52.0)
Hemoglobin: 12.9 g/dL — ABNORMAL LOW (ref 13.0–17.0)
MCH: 29.2 pg (ref 26.0–34.0)
MCHC: 32.7 g/dL (ref 30.0–36.0)
MCV: 89.4 fL (ref 80.0–100.0)
Platelets: 394 10*3/uL (ref 150–400)
RBC: 4.42 MIL/uL (ref 4.22–5.81)
RDW: 12.7 % (ref 11.5–15.5)
WBC: 7.1 10*3/uL (ref 4.0–10.5)
nRBC: 0 % (ref 0.0–0.2)

## 2023-01-05 LAB — BASIC METABOLIC PANEL
Anion gap: 8 (ref 5–15)
BUN: 6 mg/dL (ref 6–20)
CO2: 28 mmol/L (ref 22–32)
Calcium: 9.2 mg/dL (ref 8.9–10.3)
Chloride: 105 mmol/L (ref 98–111)
Creatinine, Ser: 1.04 mg/dL (ref 0.61–1.24)
GFR, Estimated: >60 mL/min (ref >60)
Glucose, Bld: 112 mg/dL — ABNORMAL HIGH (ref 70–99)
Potassium: 4.3 mmol/L (ref 3.5–5.1)
Sodium: 141 mmol/L (ref 135–145)

## 2023-01-05 LAB — GLUCOSE, CAPILLARY
Glucose-Capillary: 122 mg/dL — ABNORMAL HIGH (ref 70–99)
Glucose-Capillary: 108 mg/dL — ABNORMAL HIGH (ref 70–99)

## 2023-01-05 NOTE — Progress Notes (Signed)
Occupational Therapy Session Note  Patient Details  Name: Mario [name] MRN: [id number] Date of Birth: 05/10/1980  Today's Date: 01/05/2023 OT Individual Time: 1300-1355 OT Individual Time Calculation (min): 55 min    Short Term Goals: Week 2:  OT Short Term Goal 1 (Week 2): STG=LTG 2/2 ELOS  Skilled Therapeutic Interventions/Progress Updates:    Pt resting in bed upon arrival and ready for therapy. OT intervention with focus on bed mobility (hospital bed and std bed), functional amb with RW, standing balance, and safety awareness to increase independence with BADLs. Supine>sit EOB with bed rails at supervision. Transfer to w/c using RW with CGA. Pt engaged in game of Wii bowling-standing with rest break x 1. No LOB noted. Block practice sit<>supine in std bed in ADL apt x 3 at supervision level. Min verbal cues for technique. Pt returned to room and transferred to bed. Sit>supine with supervision. All needs within reach. Bed alarm activated.   Therapy Documentation Precautions:  Precautions Precautions: Back, Fall Precaution Comments: reviewed back precautions Required Braces or Orthoses: Spinal Brace Spinal Brace: Thoracolumbosacral orthotic, Applied in sitting position, Other (comment) Spinal Brace Comments: no brace ordered, but pt prefers for comfort Restrictions Weight Bearing Restrictions: No Pain:  "I'm ok"   Therapy/Group: Individual Therapy  [name] 01/05/2023, 2:23 PM

## 2023-01-05 NOTE — Progress Notes (Signed)
PROGRESS NOTE   Subjective/Complaints:  Pt reports large BM this AM- per nurse was ~ 2-3am- large to xtra-large.  Musculoskeletal pain MUCH better- doesn't think needs Trigger point injections anymore- just when gets moving, but otherwise doing well.     ROS:    Pt denies SOB, abd pain, CP, N/V/C/D, and vision changes   Except for HPI  Objective:   No results found. Recent Labs    01/05/23 0707  WBC 7.1  HGB 12.9*  HCT 39.5  PLT 394      Recent Labs    01/05/23 0707  NA 141  K 4.3  CL 105  CO2 28  GLUCOSE 112*  BUN 6  CREATININE 1.04  CALCIUM 9.2       Intake/Output Summary (Last 24 hours) at 01/05/2023 1838 Last data filed at 01/05/2023 1755 Gross per 24 hour  Intake 477 ml  Output 1550 ml  Net -1073 ml        Physical Exam: Vital Signs Blood pressure 126/70, pulse 70, temperature 98.6 F (37 C), temperature source Oral, resp. rate 18, height 6\' 1"  (1.854 m), weight 126.6 kg, SpO2 99%.     General: awake, alert, appropriate, supine in bed; OT in room; NAD HENT: conjugate gaze; oropharynx moist CV: regular rate and rhythm; no JVD Pulmonary: CTA B/L; no W/R/R- good air movement GI: soft, NT, ND, (+)BS Psychiatric: appropriate Neurological: Ox3  Skin: Clean and intact without signs of breakdown. Back incision steristrips in place  PRIOR EXAMS: Neuro:  Alert and oriented x 3. Normal insight and awareness. Intact Memory. Normal language and speech. Cranial nerve exam unremarkable. MMT: UE motor 5/5. LLE 3- to 3/5 HF, KE and 1+ PF and tr-1 ADF. RLE: 3- to 3/5 HF, KE and 1 APF, tr ADF.  T8 sensory level. Can sense LT and pain in both legs, lesser on feet?, DTR's 1+, no resting tone today. No clonus appreciated Musculoskeletal: back in TLSO, tender with sitting.   Spasms much better controlled- no spontaneous spasms-stable   Assessment/Plan: 1. Functional deficits which require 3+ hours  per day of interdisciplinary therapy in a comprehensive inpatient rehab setting. Physiatrist is providing close team supervision and 24 hour management of active medical problems listed below. Physiatrist and rehab team continue to assess barriers to discharge/monitor patient progress toward functional and medical goals  Care Tool:  Bathing    Body parts bathed by patient: Right arm, Face, Left arm, Chest, Abdomen, Right upper leg, Left upper leg, Front perineal area, Buttocks, Right lower leg, Left lower leg   Body parts bathed by helper: Right lower leg, Left lower leg     Bathing assist Assist Level: Contact Guard/Touching assist     Upper Body Dressing/Undressing Upper body dressing   What is the patient wearing?: Pull over shirt    Upper body assist Assist Level: Independent    Lower Body Dressing/Undressing Lower body dressing      What is the patient wearing?: Underwear/pull up, Pants     Lower body assist Assist for lower body dressing: Contact Guard/Touching assist     Toileting Toileting    Toileting assist Assist for toileting: Minimal Assistance -  Patient > 75%     Transfers Chair/bed transfer  Transfers assist     Chair/bed transfer assist level: Moderate Assistance - Patient 50 - 74%     Locomotion Ambulation   Ambulation assist      Assist level: 2 helpers Assistive device: Walker-rolling Max distance: 42 ft   Walk 10 feet activity   Assist     Assist level: 2 helpers Assistive device: Walker-rolling   Walk 50 feet activity   Assist Walk 50 feet with 2 turns activity did not occur: Safety/medical concerns         Walk 150 feet activity   Assist Walk 150 feet activity did not occur: Safety/medical concerns         Walk 10 feet on uneven surface  activity   Assist Walk 10 feet on uneven surfaces activity did not occur: Safety/medical concerns         Wheelchair     Assist Is the patient using a  wheelchair?: Yes Type of Wheelchair: Manual    Wheelchair assist level: Supervision/Verbal cueing Max wheelchair distance: 100    Wheelchair 50 feet with 2 turns activity    Assist        Assist Level: Supervision/Verbal cueing   Wheelchair 150 feet activity     Assist      Assist Level: Minimal Assistance - Patient > 75%   Blood pressure 126/70, pulse 70, temperature 98.6 F (37 C), temperature source Oral, resp. rate 18, height 6\' 1"  (1.854 m), weight 126.6 kg, SpO2 99%.  Medical Problem List and Plan: 1. Functional deficits secondary to T7 thoracic spinal cord injury             -patient may shower             -ELOS/Goals: modI 10-14 days            D/c 9/20  Con't CIR PT and OT Doesn't need trigger point injections  -pt given grounds pass 2.  Antithrombotics: -DVT/anticoagulation:  DVT study neg 12/27/22; Mechanical: Sequential compression devices, below knee Bilateral lower extremities; now on lovenox 30mg  q12h             -9/13- will go home on Eliquis if not walking >300 ft at a time 3. Pain Management:  PRN oxycodone seems to be helping to control pain with activity  -9/12- pain worse this AM but thinks overdid it yesterday- taking ~ 3-4x in 24 hours- usually 10 mg  9/16- pain much better- doesn't need TrP injections per pt 4. Mood/Behavior/Sleep: LCSW to follow for evaluation and support.              -antipsychotic agents: N/A             --trazodone prn for insomnia.   -12/27/22 added melatonin 5mg  QHS PRN to help with sleep-improved  9/11- Didn't sleep well- will make sure has trazodone prn- take by 11pm  9/16- sleeping well per pt 5. Neuropsych/cognition: This patient is capable of making decisions on his own behalf. 6. Skin/Wound Care: Routine pressure relief measures. 7. Fluids/Electrolytes/Nutrition: encourage PO  -mild hyponatremia--will recheck on Monday. No obvious causes at this time  9/9- Na 136 this AM 8. HTN: Monitor BP TID--continue avapro  75mg  daily for diovan  -9/7-8/24 BPs doing good, cont regimen  9/9- BP's a little on high side- 120s-140s systolic- wil monitor for trend- make sure, although unlikely, that's it's not AD.  -9/13-15- BP controlled- con't regimen  9/16- BP  controlled- rare low 140s systolic- con't regimen Vitals:   01/01/23 0429 01/01/23 1422 01/01/23 1945 01/02/23 0652  BP: (!) 142/81 136/89 131/73 122/67   01/03/23 0429 01/03/23 1320 01/03/23 2003 01/04/23 0415  BP: 133/69 129/68 135/89 (!) 127/56   01/04/23 1317 01/04/23 2005 01/05/23 0321 01/05/23 1300  BP: 135/83 (!) 144/85 130/82 126/70    9. T2DM: Hgb A1c 6.7- - Monitor BS ac/hs and use SSI for elevated BS             --continue metformin XR 500mg  daily  -9/11- will reduce CBGs to BID before breakfast and lunch  9/12- had BG of 79-83 yesterday afternoon/evening- was given OJ just in case  9/13- Cbgs looking better- will likely just go home on Metformin long acting  -01/03/23 CBGs looking great, hasn't needed insulin since 9/12-monitor  -01/04/23- 9/16 CBGs great again, needed 1U novolog yesterday afternoon CBG (last 3)  Recent Labs    01/04/23 1132 01/05/23 0623 01/05/23 1157  GLUCAP 108* 122* 108*     10. Neurogenic bowel: - Enema administered with only small result-->KUB showed moderate to large stool burden.              --continue Senna S to 2 tabs bid -schedule miralax starting tomorrow -9/6 give sorbitol 60cc today, SSE if needed in pm  -12/28/22 LBM last night, moving bowels better, cont regimen 9/9- LBM 1-2 days ago, however getting to bathroom this AM to have BM with OT 9/10- LBM yesterday 9/11- LBM yesterday 9/12- going most days in AM, but not so far this AM- LBM yesterday 01/04/23- LBM this AM 9/16- going daily 11. Spasticity: continue baclofen 5mg  QID--this dose seems to be working for him without causing too much fatigue or weakness-- getting it nightly now, and TID PRN.    9/9- pt reports that was getting 10 mg QID, however  felt too weak- ok with scheduling Baclofen since spasms out of control on prn/at bedtime- changed to 5 mg QID  9/10- will add Zanaflex 2 mg TID and another 2 mg at bedtime to help spasms- my concern is that pt might be developing an illness- sometimes spike in spasms can be from infection brewing, but he feels well right now- educated pt on this concept.   9/11- MUCH better today-   9/12- spasms doing better with addition/increase of Zanaflex 12. Myofascial pain  9/11- having pain in lateral scapula/teres- will get therapy to do myofascial release due to trigger point injections  9/12- Theracane lent to pt- is helping some. 9/13- pt asking for more myofasical release- d/w OT     LOS: 12 days A FACE TO FACE EVALUATION WAS PERFORMED  Shakil Dirk 01/05/2023, 6:38 PM

## 2023-01-05 NOTE — Progress Notes (Signed)
Occupational Therapy Session Note  Patient Details  Name: Mario [name] MRN: [id number] Date of Birth: [birth year]  Today's Date: 01/05/2023 OT Individual Time: 0700-0810 OT Individual Time Calculation (min): 70 min    Short Term Goals: Week 2:  OT Short Term Goal 1 (Week 2): STG=LTG 2/2 ELOS  Skilled Therapeutic Interventions/Progress Updates:    OT intervention with focus on functional amb with RW, toileting, bathing at shower level, dressing with sit<>stand from w/c at sink, activity tolerance, and safety awareness to increase independence with BADLs. Supine>sit EOB using bed rails with supervision. Pt amb with RW into bathroom to use toilet prior to transferring to TTB in shower. Sit<>stand from std toilet with CGA using grab bars. Pt completes bathing with supervision at shower level. Pt amb with RW back to w/c in room to complete dressing. Pt requires assist donning socks/shoes (with foot-up) without AE. Currently, a wide sock aide is not available. Pt will need assistance donning shoe with foot-up. Amb in hallway with CGA. Pt returned to room and remained in w/c to eat breakfast. All needs within reach.   Therapy Documentation Precautions:  Precautions Precautions: Back, Fall Precaution Comments: reviewed back precautions Required Braces or Orthoses: Spinal Brace Spinal Brace: Thoracolumbosacral orthotic, Applied in sitting position, Other (comment) Spinal Brace Comments: no brace ordered, but pt prefers for comfort Restrictions Weight Bearing Restrictions: No   Pain:  Pt reports BLE spasms when first moving in AM; activity, MD aware   Therapy/Group: Individual Therapy  [name] 01/05/2023, 8:11 AM

## 2023-01-05 NOTE — Progress Notes (Signed)
Physical Therapy Session Note  Patient Details  Name: Mario [name] MRN: [id number] Date of Birth: Jan 30, 1981  Today's Date: 01/05/2023 PT Individual Time: 1020-1100 PT Individual Time Calculation (min): 40 min   Short Term Goals: Week 2:  PT Short Term Goal 1 (Week 2): =LTGs d/t ELOS  Skilled Therapeutic Interventions/Progress Updates:    Pt seated in w/c on arrival and agreeable to therapy. Pt reports continued unrated pain, but improved from previous sessions with this therapist.   Pt ambulated to/from room with CGA-min A. Note gait pattern limited by spasticity with tendency toward scissoring that pt can overcome with difficulty.  Pt engaged in stretching routine including the following: Seated EOB hamstring and quad stretch  Long sitting figure 4 with assist Prone hip extension + assist for quad/hip flexor stretch Standing abduction stretch  Pt then participated in step ups, 4 x 4 on 6" step with BHR. Cues to overcome scissoring and improve abductor/glute engagement to reduce compensation strategies. Min A for safety. On returned to room pt performed Sit to stand with cues to increase anterior weight shift and decrease UE use.   Pt returned to bed with mod A and was left with all needs in reach and alarm active.   Therapy Documentation Precautions:  Precautions Precautions: Back, Fall Precaution Comments: reviewed back precautions Required Braces or Orthoses: Spinal Brace Spinal Brace: Thoracolumbosacral orthotic, Applied in sitting position, Other (comment) Spinal Brace Comments: no brace ordered, but pt prefers for comfort Restrictions Weight Bearing Restrictions: No General:      Therapy/Group: Individual Therapy  [name] 01/05/2023, 12:37 PM

## 2023-01-06 DIAGNOSIS — G8222 Paraplegia, incomplete: Secondary | ICD-10-CM | POA: Diagnosis not present

## 2023-01-06 LAB — GLUCOSE, CAPILLARY
Glucose-Capillary: 102 mg/dL — ABNORMAL HIGH (ref 70–99)
Glucose-Capillary: 79 mg/dL (ref 70–99)

## 2023-01-06 NOTE — Plan of Care (Signed)
  Problem: RH Grooming Goal: LTG Patient will perform grooming w/assist,cues/equip (OT) Description: LTG: Patient will perform grooming with assist, with/without cues using equipment (OT) Flowsheets (Taken 01/06/2023 0825) LTG: Pt will perform grooming with assistance level of: (upgraded JLS) Independent   Problem: RH Dressing Goal: LTG Patient will perform upper body dressing (OT) Description: LTG Patient will perform upper body dressing with assist, with/without cues (OT). Flowsheets (Taken 01/06/2023 0825) LTG: Pt will perform upper body dressing with assistance level of: (upgraded JLS) Independent Goal: LTG Patient will perform lower body dressing w/assist (OT) Description: LTG: Patient will perform lower body dressing with assist, with/without cues in positioning using equipment (OT) Flowsheets (Taken 01/06/2023 0825) LTG: Pt will perform lower body dressing with assistance level of: (downgraded to due to needing A for foot up brace) Minimal Assistance - Patient > 75% Note: downgraded to due to needing A for foot up brace

## 2023-01-06 NOTE — Progress Notes (Signed)
Physical Therapy Session Note  Patient Details  Name: Mario [name] MRN: [id number] Date of Birth: 1980/07/14  Today's Date: 01/06/2023 PT Individual Time: 1300-1415 PT Individual Time Calculation (min): 75 min   Short Term Goals: Week 2:  PT Short Term Goal 1 (Week 2): =LTGs d/t ELOS  Skilled Therapeutic Interventions/Progress Updates: Pt presents supine in bed and agreeable to therapy.  Pt wishes to go outside for improved mood.  Pt transfers sup to sit w/ supervision and use of siderails.  Pt sat EOB for total A for donning shoes w/ foot-up brace on R.  Pt transfers sit to stand w/ RW and CGA from elevated bed.  Pt amb w/ RW to w/c w/ min/CGA.  Pt w/ ataxic LES but maintaining BOS.  Pt states over-reliance on UES and encouraged to increase LE WB.  Pt wheeled to outside through [facility].  Pt transferred from w/c, and outdoor benches w/ CGA, but cues for sequencing.  Pt amb multiple trials of 50' on uneven surfaces w/ min Aand cues for upright posture.  Pt performed standing marching w/ taps to RW and alternating knee flex.  Pt returned to hallway and amb to bed w/ min A.  Pt transfers sit to supine w/ siderail A and remained in bed w/ bed alarm on and all needs in reach.     Therapy Documentation Precautions:  Precautions Precautions: Back, Fall Precaution Comments: reviewed back precautions Required Braces or Orthoses: Spinal Brace Spinal Brace: Thoracolumbosacral orthotic, Applied in sitting position, Other (comment) Spinal Brace Comments: no brace ordered, but pt prefers for comfort Restrictions Weight Bearing Restrictions: No General:   Vital Signs:   Pain:0/10       Therapy/Group: Individual Therapy  Lucio Edward 01/06/2023, 2:17 PM

## 2023-01-06 NOTE — Progress Notes (Signed)
Patient ID: Mario [name], male   DOB: 09/20/1980, 42 y.o.   MRN: [id number]  SW made efforts to meet with pt but at time of arrival PT arrived. SW will follow-up with updates from team conference.   1313-SW  spoke with pt wife to provide updates from team conference, d/c date remains 9/20, confirmed family edu tomorrow, and will confirm d/c recs tomorrow as well.   Cecile Sheerer, MSW, LCSWA Office: [phone] Cell: [phone] Fax: [phone]

## 2023-01-06 NOTE — Patient Care Conference (Signed)
Inpatient RehabilitationTeam Conference and Plan of Care Update Date: 01/06/2023   Time: 11:00 AM    Patient Name: Mario [name]      Medical Record Number: [id number]  Date of Birth: [birth year] Sex: Male         Room/Bed: 4M05C/4M05C-01 Payor Info: Payor: BLUE CROSS BLUE [employer]IELD / Plan: BCBS COMM PPO / Product Type: *No Product type* /    Admit Date/Time:  12/24/2022 12:09 PM  Primary Diagnosis:  Acute incomplete paraplegia [facility])  Hospital Problems: Principal Problem:   Acute incomplete paraplegia (HCC) Active Problems:   Thoracic myelopathy   Adjustment disorder with mixed anxiety and depressed mood    Expected Discharge Date: Expected Discharge Date: 01/09/23  Team Members Present: Physician leading conference: Dr. Genice Rouge Social Worker Present: Cecile Sheerer, LCSWA Nurse Present: Vedia Pereyra, RN PT Present: Bernie Covey, PT OT Present: Roney Mans, OT PPS Coordinator present : Fae Pippin, SLP     Current Status/Progress Goal Weekly Team Focus  Bowel/Bladder   Patient is continent of b/b   maintain continence   offer toileting qshift and PRN    Swallow/Nutrition/ Hydration               ADL's   CGA for ADLs except assistance for donning shoes without AE   supervision overall (downgrade donning footwear to min A)   education, BADLs, safety awareness    Mobility   CGA to min A gait, min a step ups up to 4, gait up to 150 ft   supervision overall  stair training, BLE strength, gait mechanics, tone management    Communication                Safety/Cognition/ Behavioral Observations               Pain   patient c/o back pain 8/10 leg spasms have improved   maintain pain level of 3/10 using scheduled and PRN medication   assess pain qshift and PRN    Skin   skin is intact other than surgical incision   maintain skin integrity and infection free incision  assess skin qshift and PRN for s/s of infection or breakdown       Discharge Planning:  D/c plan remains to be home with his wife who will provide 24/7 care and PRN support from sons. Fam edu on 9/18 9am-12pm with patient's wife. SW will confirm there are no barriers to discharge.   Team Discussion: Acute incomplete paraplegia. Time toileting. Spasms improved but taking oxycodone every 4 hours.  Will need home exercise program. Incision to back with glue, no drainage. Blood sugars well controlled. Will transition from Lovenox to Eliquis at discharge. Refuses SCD's.  Will provide application for medication. Tolerating carb-mod diet. Patient on target to meet rehab goals: yes, downgraded footup goals and gait goals.  Otherwise meeting goals with discharge date 01/09/23  *See Care Plan and progress notes for long and short-term goals.   Revisions to Treatment Plan:  Medication adjustments. Monitor labs/VS  Teaching Needs: Medications, safety, self care, transfer training, etc.   Current Barriers to Discharge: Decreased caregiver support and Home enviroment access/layout  Possible Resolutions to Barriers: Family education Able to manage main floor living Order recommended DME     Medical Summary Current Status: continent B/B with timed voiding- spasms overall are better- takes Percocet q4 hours - CBGs BID- on Lovenox- will need to switch lovenox to Elqiuis at d/c.  Barriers to Discharge: Behavior/Mood;Morbid Obesity;Neurogenic Bowel & Bladder;Spasticity;Self-care  education;Uncontrolled Pain;Weight bearing restrictions  Barriers to Discharge Comments: taking pain meds q4 hours-needs HEP- since insurance coverag eof therapy is bad; spasticity still and issue- still using L foot up brace- needs BSC and tub transfer bench? Possible Resolutions to [employer]: gait up to 167ft- working on step up- working on managing spasticity with ROM so don't have to increase baclofen/zanaflex; will need to change to Eliquis when goes home for a total of 2 months  of meds- d/c 9/20   Continued Need for Acute Rehabilitation Level of Care: The patient requires daily medical management by a physician with specialized training in physical medicine and rehabilitation for the following reasons: Direction of a multidisciplinary physical rehabilitation program to maximize functional independence : Yes Medical management of patient stability for increased activity during participation in an intensive rehabilitation regime.: Yes Analysis of laboratory values and/or radiology reports with any subsequent need for medication adjustment and/or medical intervention. : Yes   I attest that I was present, lead the team conference, and concur with the assessment and plan of the team.   Jearld Adjutant 01/06/2023, 3:27 PM

## 2023-01-06 NOTE — Progress Notes (Signed)
PROGRESS NOTE   Subjective/Complaints:  LBM this AM Also had shower with OT- went well.  Is having some clonus in feet/ankles- wondering if should increase spasticity meds.     ROS:    Pt denies SOB, abd pain, CP, N/V/C/D, and vision changes   Except for HPI  Objective:   No results found. Recent Labs    01/05/23 0707  WBC 7.1  HGB 12.9*  HCT 39.5  PLT 394      Recent Labs    01/05/23 0707  NA 141  K 4.3  CL 105  CO2 28  GLUCOSE 112*  BUN 6  CREATININE 1.04  CALCIUM 9.2       Intake/Output Summary (Last 24 hours) at 01/06/2023 0834 Last data filed at 01/06/2023 1610 Gross per 24 hour  Intake 473 ml  Output 1900 ml  Net -1427 ml        Physical Exam: Vital Signs Blood pressure 137/77, pulse 66, temperature 97.6 F (36.4 C), resp. rate 17, height 6\' 1"  (1.854 m), weight 126.6 kg, SpO2 98%.      General: awake, alert, appropriate, sitting up in w/c just had shower with OT; NAD HENT: conjugate gaze; oropharynx moist CV: regular rate and rhythm; no JVD Pulmonary: CTA B/L; no W/R/R- good air movement GI: soft, NT, ND, (+)BS Psychiatric: appropriate- interactive Neurological: Ox3 3-4 beats of fine clonus in B/L ankles  Skin: Clean and intact without signs of breakdown. Back incision steristrips in place  PRIOR EXAMS: Neuro:  Alert and oriented x 3. Normal insight and awareness. Intact Memory. Normal language and speech. Cranial nerve exam unremarkable. MMT: UE motor 5/5. LLE 3- to 3/5 HF, KE and 1+ PF and tr-1 ADF. RLE: 3- to 3/5 HF, KE and 1 APF, tr ADF.  T8 sensory level. Can sense LT and pain in both legs, lesser on feet?, DTR's 1+, no resting tone today. No clonus appreciated Musculoskeletal: back in TLSO, tender with sitting.   Spasms much better controlled- no spontaneous spasms-stable   Assessment/Plan: 1. Functional deficits which require 3+ hours per day of interdisciplinary  therapy in a comprehensive inpatient rehab setting. Physiatrist is providing close team supervision and 24 hour management of active medical problems listed below. Physiatrist and rehab team continue to assess barriers to discharge/monitor patient progress toward functional and medical goals  Care Tool:  Bathing    Body parts bathed by patient: Right arm, Face, Left arm, Chest, Abdomen, Right upper leg, Left upper leg, Front perineal area, Buttocks, Right lower leg, Left lower leg   Body parts bathed by helper: Right lower leg, Left lower leg     Bathing assist Assist Level: Supervision/Verbal cueing     Upper Body Dressing/Undressing Upper body dressing   What is the patient wearing?: Pull over shirt    Upper body assist Assist Level: Independent    Lower Body Dressing/Undressing Lower body dressing      What is the patient wearing?: Underwear/pull up, Pants     Lower body assist Assist for lower body dressing: Contact Guard/Touching assist     Toileting Toileting    Toileting assist Assist for toileting: Minimal Assistance - Patient >  75%     Transfers Chair/bed transfer  Transfers assist     Chair/bed transfer assist level: Moderate Assistance - Patient 50 - 74%     Locomotion Ambulation   Ambulation assist      Assist level: 2 helpers Assistive device: Walker-rolling Max distance: 42 ft   Walk 10 feet activity   Assist     Assist level: 2 helpers Assistive device: Walker-rolling   Walk 50 feet activity   Assist Walk 50 feet with 2 turns activity did not occur: Safety/medical concerns         Walk 150 feet activity   Assist Walk 150 feet activity did not occur: Safety/medical concerns         Walk 10 feet on uneven surface  activity   Assist Walk 10 feet on uneven surfaces activity did not occur: Safety/medical concerns         Wheelchair     Assist Is the patient using a wheelchair?: Yes Type of Wheelchair:  Manual    Wheelchair assist level: Supervision/Verbal cueing Max wheelchair distance: 100    Wheelchair 50 feet with 2 turns activity    Assist        Assist Level: Supervision/Verbal cueing   Wheelchair 150 feet activity     Assist      Assist Level: Minimal Assistance - Patient > 75%   Blood pressure 137/77, pulse 66, temperature 97.6 F (36.4 C), resp. rate 17, height 6\' 1"  (1.854 m), weight 126.6 kg, SpO2 98%.  Medical Problem List and Plan: 1. Functional deficits secondary to T7 thoracic spinal cord injury             -patient may shower             -ELOS/Goals: modI 10-14 days            D/c 9/20  Con't CIR PT and OT  Team conference today to finalize d/c Doesn't need trigger point injections  -pt given grounds pass 2.  Antithrombotics: -DVT/anticoagulation:  DVT study neg 12/27/22; Mechanical: Sequential compression devices, below knee Bilateral lower extremities; now on lovenox 30mg  q12h             -9/13- will go home on Eliquis if not walking >300 ft at a time 3. Pain Management:  PRN oxycodone seems to be helping to control pain with activity  -9/12- pain worse this AM but thinks overdid it yesterday- taking ~ 3-4x in 24 hours- usually 10 mg  9/16- 9/17pain much better- doesn't need TrP injections per pt 4. Mood/Behavior/Sleep: LCSW to follow for evaluation and support.              -antipsychotic agents: N/A             --trazodone prn for insomnia.   -12/27/22 added melatonin 5mg  QHS PRN to help with sleep-improved  9/11- Didn't sleep well- will make sure has trazodone prn- take by 11pm  9/16- sleeping well per pt 5. Neuropsych/cognition: This patient is capable of making decisions on his own behalf. 6. Skin/Wound Care: Routine pressure relief measures. 7. Fluids/Electrolytes/Nutrition: encourage PO  -mild hyponatremia--will recheck on Monday. No obvious causes at this time  9/9- Na 136 this AM 8. HTN: Monitor BP TID--continue avapro 75mg  daily for  diovan  -9/7-8/24 BPs doing good, cont regimen  9/9- BP's a little on high side- 120s-140s systolic- wil monitor for trend- make sure, although unlikely, that's it's not AD.  -9/13-15- BP controlled- con't regimen  9/16- 9/17BP controlled- rare low 140s systolic- con't regimen Vitals:   01/01/23 1945 01/02/23 0652 01/03/23 0429 01/03/23 1320  BP: 131/73 122/67 133/69 129/68   01/03/23 2003 01/04/23 0415 01/04/23 1317 01/04/23 2005  BP: 135/89 (!) 127/56 135/83 (!) 144/85   01/05/23 0321 01/05/23 1300 01/05/23 2021 01/06/23 0501  BP: 130/82 126/70 (!) 158/91 137/77    9. T2DM: Hgb A1c 6.7- - Monitor BS ac/hs and use SSI for elevated BS             --continue metformin XR 500mg  daily  -9/11- will reduce CBGs to BID before breakfast and lunch  9/12- had BG of 79-83 yesterday afternoon/evening- was given OJ just in case  9/13- Cbgs looking better- will likely just go home on Metformin long acting  -01/03/23 CBGs looking great, hasn't needed insulin since 9/12-monitor  -01/04/23- 9/16 CBGs great again, needed 1U novolog yesterday afternoon  9/17- CBGs looking great- con't regimen CBG (last 3)  Recent Labs    01/05/23 0623 01/05/23 1157 01/06/23 0604  GLUCAP 122* 108* 102*     10. Neurogenic bowel: - Enema administered with only small result-->KUB showed moderate to large stool burden.              --continue Senna S to 2 tabs bid -schedule miralax starting tomorrow -9/6 give sorbitol 60cc today, SSE if needed in pm  -12/28/22 LBM last night, moving bowels better, cont regimen 9/9- LBM 1-2 days ago, however getting to bathroom this AM to have BM with OT 9/17- LBM this AM- going daily.  11. Spasticity: continue baclofen 5mg  QID--this dose seems to be working for him without causing too much fatigue or weakness-- getting it nightly now, and TID PRN.    9/9- pt reports that was getting 10 mg QID, however felt too weak- ok with scheduling Baclofen since spasms out of control on prn/at  bedtime- changed to 5 mg QID  9/10- will add Zanaflex 2 mg TID and another 2 mg at bedtime to help spasms- my concern is that pt might be developing an illness- sometimes spike in spasms can be from infection brewing, but he feels well right now- educated pt on this concept.   9/11- MUCH better today-   9/12- spasms doing better with addition/increase of Zanaflex  9/17- having more clonus- but not painful or getting in way of function- will wait to increase meds for today but monitor trend/closely.  12. Myofascial pain  9/11- having pain in lateral scapula/teres- will get therapy to do myofascial release due to trigger point injections  9/12- Theracane lent to pt- is helping some. 9/13- pt asking for more myofasical release- d/w OT  9/17- doing much better-    I spent a total of 38   minutes on total care today- >50% coordination of care- due to  Education of pt about spasticity and don't treat more unless interferes in function or painful or real annoying- his isn't any of these, so will wait to increase meds- also team conference to finalize d/c.   LOS: 13 days A FACE TO FACE EVALUATION WAS PERFORMED  Mario [name] 01/06/2023, 8:34 AM

## 2023-01-06 NOTE — Plan of Care (Signed)
Goal adjusted to better reflect pt's prognosis.

## 2023-01-06 NOTE — Progress Notes (Signed)
Occupational Therapy Session Note  Patient Details  Name: Mario [name] MRN: [id number] Date of Birth: 02/07/1981  Today's Date: 01/06/2023 OT Individual Time: 0700-0810 OT Individual Time Calculation (min): 70 min    Short Term Goals: Week 2:  OT Short Term Goal 1 (Week 2): STG=LTG 2/2 ELOS  Skilled Therapeutic Interventions/Progress Updates:    OT intervention with focus on bed mobility, sit<>stand, standing balance, functional amb with RW, bahting/dressing, and safety awareness to increase independence with BADLs. Supine>sit EOB with supervision using bed rails. Sit<>stand and amb with RW into bathroom with CGA. Bathing at shower level with supervision. Pt returned to room and completed dressing tasks with sit<>stand from w/c at sink. Pt using reacher to assist with threading pants over BLE. Standing balance with CGA when pulling pants over hips. Pt requires assistance to don socks (no wide sock aide avail) and shoes 2/2 foot-up on RLE. Pt amb in hall way approx 50' before returning to w/c to eat breakfast. All needs within reach.   Therapy Documentation Precautions:  Precautions Precautions: Back, Fall Precaution Comments: reviewed back precautions Required Braces or Orthoses: Spinal Brace Spinal Brace: Thoracolumbosacral orthotic, Applied in sitting position, Other (comment) Spinal Brace Comments: no brace ordered, but pt prefers for comfort Restrictions Weight Bearing Restrictions: No General:   Vital Signs: Therapy Vitals Temp: 97.6 F (36.4 C) Pulse Rate: 66 Resp: 17 BP: 137/77 Patient Position (if appropriate): Lying Oxygen Therapy SpO2: 98 % O2 Device: Room Air Pain: Pain Assessment Pain Scale: 0-10 Pain Score: 4  Pain Type: Acute pain Pain Location: Back Pain Orientation: Mid;Lower Pain Descriptors / Indicators: Aching Pain Frequency: Constant Pain Onset: On-going Patients Stated Pain Goal: 2 Pain Intervention(s): Medication (See eMAR) Multiple Pain  Sites: No ADL: ADL Eating: Set up Grooming: Supervision/safety Where Assessed-Grooming: Edge of bed Upper Body Bathing: Minimal assistance Lower Body Bathing: Moderate assistance Upper Body Dressing: Supervision/safety Lower Body Dressing: Maximal assistance Toileting: Maximal assistance Where Assessed-Toileting: Bedside Commode Toilet Transfer: Moderate assistance Toilet Transfer Method: Stand pivot Toilet Transfer Equipment: Drop arm bedside commode Tub/Shower Transfer: Moderate assistance Tub/Shower Transfer Method: Stand pivot Tub/Shower Equipment: Insurance underwriter: Moderate assistance Film/video editor Method: Runner, broadcasting/film/video ADL Comments: Slow stepping noted with transfers, heavy reliance on UE on RW for transfers, safe transfer techniquem increased assistance required with LB ADLs Vision   Perception    Praxis   Balance   Exercises:   Other Treatments:     Therapy/Group: Individual Therapy  [name] 01/06/2023, 8:11 AM

## 2023-01-06 NOTE — Progress Notes (Signed)
Physical Therapy Session Note  Patient Details  Name: Mario [name] MRN: [id number] Date of Birth: April 02, 1981  Today's Date: 01/06/2023 PT Individual Time: 0900-1000 PT Individual Time Calculation (min): 60 min   Short Term Goals: Week 2:  PT Short Term Goal 1 (Week 2): =LTGs d/t ELOS  Skilled Therapeutic Interventions/Progress Updates:    Pt seated in w/c on arrival and agreeable to therapy. No complaint of pain. Pt does report feeling general malaise and nausea this am.   Pt performed self stretching for hamstrings and quads with minimal cueing for technique. Pt then ambulated to bathroom with CGA, min A for sit/stand from toilet. CGA for 3/3 toileting tasks.   Hand hygiene with CGA-supervision, pt with increased hip strategies for balance noted. Pt then ambulated to main gym, ~150 ft. Note continued spastic gait pattern, but improving independence. Did note occ foot drag during last stretch of gait, which pt attributes to not feeling well.  Pt performed error augmentation toe taps on yoga block with yellow T band to encourage increased hip abduction and reduce scissoring. Pt was able to perform to fatigue BIL with good glute engagement, but extreme fatigue. Also reported nausea, so therapist retrieved emesis bag. Did not improve with further reps or rest.  Pt returned to room with improved gait pattern with no instances of toe drag and wider BOS. Pt returned to bed with supervision. Therapist adjusted foot up brace for ease of use and better positioning.   Pt then remained in bed and was left with all needs in reach and alarm active.   Therapy Documentation Precautions:  Precautions Precautions: Back, Fall Precaution Comments: reviewed back precautions Required Braces or Orthoses: Spinal Brace Spinal Brace: Thoracolumbosacral orthotic, Applied in sitting position, Other (comment) Spinal Brace Comments: no brace ordered, but pt prefers for comfort Restrictions Weight Bearing  Restrictions: No General:       Therapy/Group: Individual Therapy  [name] 01/06/2023, 10:58 AM

## 2023-01-07 LAB — GLUCOSE, CAPILLARY
Glucose-Capillary: 93 mg/dL (ref 70–99)
Glucose-Capillary: 90 mg/dL (ref 70–99)
Glucose-Capillary: 92 mg/dL (ref 70–99)

## 2023-01-07 MED ORDER — APIXABAN 2.5 MG PO TABS
2.5000 mg | ORAL_TABLET | Freq: Two times a day (BID) | ORAL | Status: DC
  Administered 2023-01-07 – 2023-01-09 (×4): 2.5 mg via ORAL
  Filled 2023-01-07 (×4): qty 1

## 2023-01-07 MED ORDER — BACLOFEN 5 MG HALF TABLET
5.0000 mg | ORAL_TABLET | Freq: Four times a day (QID) | ORAL | Status: DC
  Administered 2023-01-07 – 2023-01-09 (×8): 5 mg via ORAL
  Filled 2023-01-07 (×8): qty 1

## 2023-01-07 NOTE — Progress Notes (Signed)
PROGRESS NOTE   Subjective/Complaints:  Pt reports Family training today- d/w PT-  Said whole body /bed shaking due to spasms overnight- sounds like occurred after Baclofen/Zanaflex wore off? Not painful- LBM- yesterday   ROS:    Pt denies SOB, abd pain, CP, N/V/C/D, and vision changes  Spasms (+)  Except for HPI  Objective:   No results found. Recent Labs    01/05/23 0707  WBC 7.1  HGB 12.9*  HCT 39.5  PLT 394      Recent Labs    01/05/23 0707  NA 141  K 4.3  CL 105  CO2 28  GLUCOSE 112*  BUN 6  CREATININE 1.04  CALCIUM 9.2       Intake/Output Summary (Last 24 hours) at 01/07/2023 0823 Last data filed at 01/07/2023 0736 Gross per 24 hour  Intake 708 ml  Output 400 ml  Net 308 ml        Physical Exam: Vital Signs Blood pressure (!) 122/54, pulse (!) 54, temperature 98.1 F (36.7 C), resp. rate 18, height 6\' 1"  (1.854 m), weight 126.6 kg, SpO2 97%.       General: awake, alert, appropriate, supine in bed- no therapy til 9am; NAD HENT: conjugate gaze; oropharynx moist CV: regular rate and rhythm; no JVD Pulmonary: CTA B/L; no W/R/R- good air movement GI: soft, NT, ND, (+)BS- normoactive Psychiatric: appropriate- pleasant, interactive Neurological: Ox3 A few spasms seen spontaneously- and clonus worse this AM- 6-7 beats B/L  Skin: Clean and intact without signs of breakdown. Back incision steristrips in place  PRIOR EXAMS: Neuro:  Alert and oriented x 3. Normal insight and awareness. Intact Memory. Normal language and speech. Cranial nerve exam unremarkable. MMT: UE motor 5/5. LLE 3- to 3/5 HF, KE and 1+ PF and tr-1 ADF. RLE: 3- to 3/5 HF, KE and 1 APF, tr ADF.  T8 sensory level. Can sense LT and pain in both legs, lesser on feet?, DTR's 1+, no resting tone today. No clonus appreciated Musculoskeletal: back in TLSO, tender with sitting.   Spasms much better controlled- no spontaneous  spasms-stable   Assessment/Plan: 1. Functional deficits which require 3+ hours per day of interdisciplinary therapy in a comprehensive inpatient rehab setting. Physiatrist is providing close team supervision and 24 hour management of active medical problems listed below. Physiatrist and rehab team continue to assess barriers to discharge/monitor patient progress toward functional and medical goals  Care Tool:  Bathing    Body parts bathed by patient: Right arm, Face, Left arm, Chest, Abdomen, Right upper leg, Left upper leg, Front perineal area, Buttocks, Right lower leg, Left lower leg   Body parts bathed by helper: Right lower leg, Left lower leg     Bathing assist Assist Level: Supervision/Verbal cueing     Upper Body Dressing/Undressing Upper body dressing   What is the patient wearing?: Pull over shirt    Upper body assist Assist Level: Independent    Lower Body Dressing/Undressing Lower body dressing      What is the patient wearing?: Underwear/pull up, Pants     Lower body assist Assist for lower body dressing: Contact Guard/Touching assist     Toileting Toileting  Toileting assist Assist for toileting: Minimal Assistance - Patient > 75%     Transfers Chair/bed transfer  Transfers assist     Chair/bed transfer assist level: Moderate Assistance - Patient 50 - 74%     Locomotion Ambulation   Ambulation assist      Assist level: Minimal Assistance - Patient > 75% Assistive device: Walker-rolling Max distance: 50   Walk 10 feet activity   Assist     Assist level: Minimal Assistance - Patient > 75% Assistive device: Walker-rolling   Walk 50 feet activity   Assist Walk 50 feet with 2 turns activity did not occur: Safety/medical concerns  Assist level: Minimal Assistance - Patient > 75% Assistive device: Walker-rolling    Walk 150 feet activity   Assist Walk 150 feet activity did not occur: Safety/medical concerns          Walk 10 feet on uneven surface  activity   Assist Walk 10 feet on uneven surfaces activity did not occur: Safety/medical concerns   Assist level: Minimal Assistance - Patient > 75% Assistive device: Walker-rolling   Wheelchair     Assist Is the patient using a wheelchair?: Yes Type of Wheelchair: Manual    Wheelchair assist level: Supervision/Verbal cueing Max wheelchair distance: 100    Wheelchair 50 feet with 2 turns activity    Assist        Assist Level: Supervision/Verbal cueing   Wheelchair 150 feet activity     Assist      Assist Level: Minimal Assistance - Patient > 75%   Blood pressure (!) 122/54, pulse (!) 54, temperature 98.1 F (36.7 C), resp. rate 18, height 6\' 1"  (1.854 m), weight 126.6 kg, SpO2 97%.  Medical Problem List and Plan: 1. Functional deficits secondary to T7 thoracic spinal cord injury             -patient may shower             -ELOS/Goals: modI 10-14 days            D/c 9/20  Con't CIR PT and OT  Will need f/u with Dr [name] 2.  Antithrombotics: -DVT/anticoagulation:  DVT study neg 12/27/22; Mechanical: Sequential compression devices, below knee Bilateral lower extremities; now on lovenox 30mg  q12h             -9/13- will go home on Eliquis if not walking >300 ft at a time  9/18- switched to Eliquis 2.5 mg BID since walking max 100 ft at a time- will need another 6 weeks of prevention/meds 3. Pain Management:  PRN oxycodone seems to be helping to control pain with activity  -9/12- pain worse this AM but thinks overdid it yesterday- taking ~ 3-4x in 24 hours- usually 10 mg  9/16- 9/17pain much better- doesn't need TrP injections per pt 4. Mood/Behavior/Sleep: LCSW to follow for evaluation and support.              -antipsychotic agents: N/A             --trazodone prn for insomnia.   -12/27/22 added melatonin 5mg  QHS PRN to help with sleep-improved  9/11- Didn't sleep well- will make sure has trazodone prn- take by  11pm  9/16- sleeping well per pt 5. Neuropsych/cognition: This patient is capable of making decisions on his own behalf. 6. Skin/Wound Care: Routine pressure relief measures. 7. Fluids/Electrolytes/Nutrition: encourage PO  -mild hyponatremia--will recheck on Monday. No obvious causes at this time  9/9- Na 136 this AM  8. HTN: Monitor BP TID--continue avapro 75mg  daily for diovan  -9/7-8/24 BPs doing good, cont regimen  9/9- BP's a little on high side- 120s-140s systolic- wil monitor for trend- make sure, although unlikely, that's it's not AD.  9/18- BP usually controlled- occ 140s systolic- con't regimen Vitals:   01/03/23 1320 01/03/23 2003 01/04/23 0415 01/04/23 1317  BP: 129/68 135/89 (!) 127/56 135/83   01/04/23 2005 01/05/23 0321 01/05/23 1300 01/05/23 2021  BP: (!) 144/85 130/82 126/70 (!) 158/91   01/06/23 0501 01/06/23 1439 01/06/23 2009 01/07/23 0542  BP: 137/77 116/69 (!) 143/81 (!) 122/54    9. T2DM: Hgb A1c 6.7- - Monitor BS ac/hs and use SSI for elevated BS             --continue metformin XR 500mg  daily  -9/11- will reduce CBGs to BID before breakfast and lunch  9/12- had BG of 79-83 yesterday afternoon/evening- was given OJ just in case  9/13- Cbgs looking better- will likely just go home on Metformin long acting  -01/03/23 CBGs looking great, hasn't needed insulin since 9/12-monitor  -01/04/23- 9/16 CBGs great again, needed 1U novolog yesterday afternoon  9/17-9/18 CBGs looking great- con't regimen CBG (last 3)  Recent Labs    01/06/23 0604 01/06/23 1145 01/07/23 0617  GLUCAP 102* 79 93     10. Neurogenic bowel: - Enema administered with only small result-->KUB showed moderate to large stool burden.              --continue Senna S to 2 tabs bid -schedule miralax starting tomorrow -9/6 give sorbitol 60cc today, SSE if needed in pm  -12/28/22 LBM last night, moving bowels better, cont regimen 9/9- LBM 1-2 days ago, however getting to bathroom this AM to have BM with  OT 9/17- LBM this AM- going daily.  11. Spasticity: continue baclofen 5mg  QID--this dose seems to be working for him without causing too much fatigue or weakness-- getting it nightly now, and TID PRN.    9/9- pt reports that was getting 10 mg QID, however felt too weak- ok with scheduling Baclofen since spasms out of control on prn/at bedtime- changed to 5 mg QID  9/10- will add Zanaflex 2 mg TID and another 2 mg at bedtime to help spasms- my concern is that pt might be developing an illness- sometimes spike in spasms can be from infection brewing, but he feels well right now- educated pt on this concept.   9/11- MUCH better today-   9/12- spasms doing better with addition/increase of Zanaflex  9/17- having more clonus- but not painful or getting in way of function- will wait to increase meds for today but monitor trend/closely.   9/18- will change Baclofen to q6 hours to cover overnight when having severe spasms- if doesn't work, will increase Zanaflex to 4 mg in AM and afternoon and 6 mg at bedtime 12. Myofascial pain  9/11- having pain in lateral scapula/teres- will get therapy to do myofascial release due to trigger point injections  9/12- Theracane lent to pt- is helping some. 9/13- pt asking for more myofasical release- d/w OT  9/17- doing much better-    I spent a total of 36   minutes on total care today- >50% coordination of care- due to  D/w PA and pt and nursing and PT about pt's spasticity as well as family training.    LOS: 14 days A FACE TO FACE EVALUATION WAS PERFORMED  Mario [name] 01/07/2023, 8:23 AM

## 2023-01-07 NOTE — Progress Notes (Addendum)
Patient ID: Mario [name], male   DOB: 1980/10/11, 42 y.o.   MRN: [id number]  SW met with pt and pt wife to discuss how well family edu went, and if there were any updates about if they would like [facility] or outpatient. SW reiterated current benefits pulled, however. shared unsure on if he has met his out-of-pocket. Pt amenable to outpatient PT/OT. SW will fax referral to [facility] (p:[phone]/f:[phone]). SW also discussed DME: 3in1 BSC and TTB. SW will order, and pt will confirm if he is able to get elsewhere. Pt would like to know if he needs a rollator. SW will confirm.   SW ordered 3in1 BSC and TTB with [employer] via parachute.   Cecile Sheerer, MSW, LCSWA Office: [phone] Cell: [phone] Fax: [phone]

## 2023-01-07 NOTE — Progress Notes (Signed)
Physical Therapy Session Note  Patient Details  Name: Mario [name] MRN: [id number] Date of Birth: 1980-10-17  Today's Date: 01/07/2023 PT Individual Time: 1108-1205 and 1347-1500 PT Individual Time Calculation (min): 57 min and 73 min  Short Term Goals: Week 2:  PT Short Term Goal 1 (Week 2): =LTGs d/t ELOS  Skilled Therapeutic Interventions/Progress Updates: Pt presented in w/c with wife present. Session focused on hands on family education. Due to wife's late arrival for OT family ed, reviewed current B&D level as provided by OT (supervision, maintaining spinal precautions) and recommendation for TTB. Pt propelled to shower room to allow pt and wife to see TTB set up as well as provided printout for purchase (also advised that can check thrift shops for used one). Pt then propelled to ortho gym and completed car transfer with supervision into compact SUV height car simulator. Pt then ambulated with CGA to main gym with PTA providing education regarding narrow BOS, intermittent adduction, spasticity management. Discussed home set up with pt's wife facetiming family to have PTA have visual of home entry (small threshold entry). Completed step up to 5in step with RW to simulate home entry with CGA overall. Provided education regarding stair management and "stepping up with good/down with bed" with pt able to demonstrate appropriately. Per pt request tried ascending 6in steps with R rail only (sidestepping) with pt able to step up x 2 and noted L knee instability and heavy reliance of BUE on rail. Recommended at this time to NOT ascend/descend stairs as no resting spot (straight 13). Discussed HHPT vs OPPT, family will have discussion with Auria, LSW regarding potential for follow up therapies. Pt then ambulated back to room with RW and light CGA fading to close supervision. In room pt requesting to use bathroom. Completed ambulatory transfer to toilet and had continent urinary void in standing with  close supervision. Pt returned to w/c at end of session and left with call bell within reach and needs met.   Tx2: Pt presented in w/c with wife present agreeable to therapy. Pt's wife left shortly after session started. Session focused on functional mobility in preparation for d/c. Discussed with pt and wife d/c plans (pt will now go OPPT) and DME recommendations. Pt questioning use of rollator vs RW, explained that due to pt's current heavy reliance on RW and decreased stability of rollator at this time clinically would recommend RW. Pt agreeable to continue working on stairs as this is an onging goal for pt. Pt propelled to main gym mod I and set up at stairs. Discussed ascending/descending via sidestepping with PTA demonstrating vs more forward as pt performed this am. Pt was able to stand with supervision and was able to ascend/descend x 8 steps with CGA. Pt noted to have mild L knee instability on last two steps descending. Explained that would continue to recommend that pt does not attempt stairs immediately upon d/c with pt verbalizing understanding. Pt then completed functional activities reaching and picking up objects off floor with pt demonstrating good safety and no LOB noted. Pt then propelled to elevators and was able to enter and exit out elevators then PTA proceeded to transport pt to outside patio of [facility] entrance for environmental change. Pt then participated in ambulation on uneven surfaces 66ft x 2 with CGA with pt demonstrating good safety with RW and good RLE positioning. Discussed energy conservation and safety upon d/c, particularly when when out for errands/appointments. Pt then transported to [facility] entrance and pt ambulated  through doors managing thresholds as well as doormats with CGA and good safety. Pt propelled remaining distance to elevators and PTA transported pt remaining distance to room. Pt completed ambulatory transfer to bed with supervision and PTA assisted pt in doffing  shoes to maintain precautions. Pt transferred to supine and left resting in bed with bed alarm on, call bell within reach and needs met.      Therapy Documentation Precautions:  Precautions Precautions: Back, Fall Precaution Comments: reviewed back precautions Required Braces or Orthoses: Spinal Brace Spinal Brace: Thoracolumbosacral orthotic, Applied in sitting position, Other (comment) Spinal Brace Comments: no brace ordered, but pt prefers for comfort Restrictions Weight Bearing Restrictions: No General:   Vital Signs:   Pain:   Mobility:   Locomotion :    Trunk/Postural Assessment :    Balance:   Exercises:   Other Treatments:      Therapy/Group: Individual Therapy  Alucard Fearnow 01/07/2023, 1:25 PM

## 2023-01-07 NOTE — Progress Notes (Signed)
Recreational Therapy Session Note  Patient Details  Name: Mario [name] MRN: [id number] Date of Birth: 1980/07/26 Today's Date: 01/07/2023  Pain: no c/o Skilled Therapeutic Interventions/Progress Updates: Met with pt & wife briefly to discuss pts participation in an outing tomorrow.  Pt agreeable, wife may participated as well. [name] 01/07/2023, 1:35 PM

## 2023-01-07 NOTE — Progress Notes (Signed)
Occupational Therapy Session Note  Patient Details  Name: Mario [name] MRN: [id number] Date of Birth: 1980/07/02  Today's Date: 01/07/2023 OT Individual Time: 0900-1000 OT Individual Time Calculation (min): 60 min    Short Term Goals: Week 2:  OT Short Term Goal 1 (Week 2): STG=LTG 2/2 ELOS  Skilled Therapeutic Interventions/Progress Updates:      Therapy Documentation Precautions:  Precautions Precautions: Back, Fall Precaution Comments: reviewed back precautions Required Braces or Orthoses: Spinal Brace Spinal Brace: Thoracolumbosacral orthotic, Applied in sitting position, Other (comment) Spinal Brace Comments: no brace ordered, but pt prefers for comfort Restrictions Weight Bearing Restrictions: No General: "I am sorry she won't be here for your session." Pt supine in bed upon OT arrival, agreeable to OT session. Scheduled family Ed today, wife was not present d/t having family issues to take care of. Pt educated on levels of assist required for safe D/C home. Pt verbally understood. Pt educated when transferring, someone must have hand on him at all times for safety with RW.   Pain:  no pain reported  ADL: Grooming: mod I seated at sink to wash face Transfers/functional mobility: CGA with RW for household distances, 1 instance of foot catching on floor, requiring Min A to correct balance Footwear: total A   Exercises: Pt completed the following exercise in rotating circuit in order to improve functional activity, strength and endurance to prepare for ADLs such as bathing. Pt completed the following exercises in seated/standing position with no noted LOB/SOB and varied repetitions on each exercise. Pt completed circuit 2x: - 5x sit to stand transfers with use of no hands and 2 second standing with no support -10x standing marches with BLE and 5# ankle weights -10x leg extentions with BLE with 5# ankle weights    Other Treatments:  Pt issued UE theraband HEP in order  to increase functional strength, andurance and activity tolerance in order to increase independence in ADLs such as bathing. Pt issued blue theraband and discussed direction/technique of exercises, demonstrating verbal understanding. Pt given exercises listed below: -elbow extensions - shoulder horizontal abduction -bicep curls  -shoulder flexion -diagonal shoulder flexion -external rotation   Pt seated in W/C at end of session with W/C alarm donned, call light within reach and 4Ps assessed.    Therapy/Group: Individual Therapy  Velia Meyer, OTD, OTR/L 01/07/2023, 12:19 PM

## 2023-01-07 NOTE — Progress Notes (Signed)
Physical Therapy Discharge Summary  Patient Details  Name: Mario [name] MRN: [id number] Date of Birth: Jan 24, 1981  Date of Discharge from PT service:January 08, 2023  {CHL IP REHAB PT TIME CALCULATION:304800500}   Patient has met {NUMBERS 0-12:18577} of {NUMBERS 0-12:18577} long term goals due to {due ZH:0865784}.  Patient to discharge at [facility] level {LOA:3049010}.   Patient's care partner {care partner:3041650} to provide the necessary {assistance:3041652} assistance at discharge.  Reasons goals not met: ***  Recommendation:  Patient will benefit from ongoing skilled PT services in {setting:3041680} to continue to advance safe functional mobility, address ongoing impairments in ***, and minimize fall risk.  Equipment: {equipment:3041657}  Reasons for discharge: {Reason for discharge:3049018}  Patient/family agrees with progress made and goals achieved: {Pt/Family agree with progress/goals:3049020}  PT Discharge Precautions/Restrictions   Vital Signs Therapy Vitals Temp: 98.1 F (36.7 C) Pulse Rate: 77 Resp: 18 BP: (!) 148/76 Patient Position (if appropriate): Sitting Oxygen Therapy SpO2: 100 % Pain   Pain Interference   Vision/Perception     Cognition Overall Cognitive Status: Within Functional Limits for tasks assessed Arousal/Alertness: Awake/alert Orientation Level: Oriented X4 Day of Week: Correct Memory: Appears intact Awareness: Appears intact Problem Solving: Appears intact Safety/Judgment: Appears intact Sensation Sensation Light Touch: Impaired by gross assessment Light Touch Impaired Details: Impaired RLE;Impaired LLE Hot/Cold: Appears Intact Proprioception: Appears Intact Stereognosis: Not tested Coordination Gross Motor Movements are Fluid and Coordinated: No Motor  Motor Motor: Paraplegia;Ataxia;Abnormal tone Motor - Skilled Clinical Observations: impaired BLE d/t compression, WFL BUE Motor - Discharge Observations: Ataxia  present but improved, pt aware and able to correct when needed  Mobility Bed Mobility Bed Mobility: Supine to Sit Supine to Sit: Independent Transfers Transfers: Sit to Stand;Stand to Sit;Stand Pivot Transfers Stand to Sit: Supervision/Verbal cueing Stand Pivot Transfers: Supervision/Verbal cueing Transfer (Assistive device): Rolling walker Locomotion  Gait Ambulation: Yes Gait Assistance: Contact Guard/Touching assist Gait Distance (Feet): 150 Feet Assistive device: Rolling walker Gait Assistance Details: Verbal cues for gait pattern;Verbal cues for precautions/safety Gait Gait: Yes Gait Pattern: Impaired Gait Pattern: Ataxic;Right genu recurvatum;Left genu recurvatum Gait velocity: Decreased self selected gait speed Stairs / Additional Locomotion Stairs: Yes Number of Stairs: 8 Height of Stairs: 6 Curb: Contact Guard/Touching assist Pick up small object from the floor assist level: Supervision/Verbal cueing Pick up small object from the floor assistive device: use of Engineer, manufacturing Wheelchair Mobility: Yes Wheelchair Assistance: Set up Education officer, museum: Both upper extremities Distance: 331ft  Trunk/Postural Assessment  Cervical Assessment Cervical Assessment: Within Functional Limits Thoracic Assessment Thoracic Assessment: Exceptions to [facility] (spinal precautions) Lumbar Assessment Lumbar Assessment: Exceptions to [facility] (spinal precautions) Postural Control Postural Control: Deficits on evaluation  Balance Balance Balance Assessed: Yes Static Sitting Balance Static Sitting - Balance Support: Feet supported;Bilateral upper extremity supported Static Sitting - Level of Assistance: 7: Independent Dynamic Sitting Balance Dynamic Sitting - Balance Support: Feet supported;Right upper extremity supported Dynamic Sitting - Level of Assistance: 6: Modified independent (Device/Increase time) Static Standing Balance Static Standing - Balance Support:  Bilateral upper extremity supported;During functional activity Static Standing - Level of Assistance: 5: Stand by assistance Dynamic Standing Balance Dynamic Standing - Balance Support: Bilateral upper extremity supported;During functional activity Dynamic Standing - Level of Assistance: 5: Stand by assistance Dynamic Standing - Balance Activities: Reaching for objects Extremity Assessment      RLE Assessment RLE Assessment: Exceptions to [facility] RLE Strength Right Hip Flexion: 4-/5 Right Knee Flexion: 4/5 Right Knee Extension: 4-/5 Right Ankle Dorsiflexion: 3/5 LLE Assessment LLE  Assessment: Exceptions to [facility] LLE Strength Left Hip Flexion: 4/5 Left Knee Flexion: 4+/5 Left Knee Extension: 4+/5 Left Ankle Dorsiflexion: 4+/5 Left Ankle Plantar Flexion: 4+/5   Rosita DeChalus 01/07/2023, 2:24 PM

## 2023-01-08 ENCOUNTER — Other Ambulatory Visit (HOSPITAL_COMMUNITY): Payer: Self-pay

## 2023-01-08 DIAGNOSIS — S24109A Unspecified injury at unspecified level of thoracic spinal cord, initial encounter: Secondary | ICD-10-CM | POA: Insufficient documentation

## 2023-01-08 DIAGNOSIS — E119 Type 2 diabetes mellitus without complications: Secondary | ICD-10-CM

## 2023-01-08 LAB — BASIC METABOLIC PANEL
Anion gap: 9 (ref 5–15)
BUN: 7 mg/dL (ref 6–20)
CO2: 25 mmol/L (ref 22–32)
Calcium: 9 mg/dL (ref 8.9–10.3)
Chloride: 103 mmol/L (ref 98–111)
Creatinine, Ser: 0.89 mg/dL (ref 0.61–1.24)
GFR, Estimated: >60 mL/min (ref >60)
Glucose, Bld: 178 mg/dL — ABNORMAL HIGH (ref 70–99)
Potassium: 4 mmol/L (ref 3.5–5.1)
Sodium: 137 mmol/L (ref 135–145)

## 2023-01-08 LAB — CBC
HCT: 39.7 % (ref 39.0–52.0)
Hemoglobin: 13.4 g/dL (ref 13.0–17.0)
MCH: 29.8 pg (ref 26.0–34.0)
MCHC: 33.8 g/dL (ref 30.0–36.0)
MCV: 88.2 fL (ref 80.0–100.0)
Platelets: 397 10*3/uL (ref 150–400)
RBC: 4.5 MIL/uL (ref 4.22–5.81)
RDW: 12.7 % (ref 11.5–15.5)
WBC: 6.1 10*3/uL (ref 4.0–10.5)
nRBC: 0 % (ref 0.0–0.2)

## 2023-01-08 LAB — GLUCOSE, CAPILLARY
Glucose-Capillary: 115 mg/dL — ABNORMAL HIGH (ref 70–99)
Glucose-Capillary: 93 mg/dL (ref 70–99)
Glucose-Capillary: 94 mg/dL (ref 70–99)

## 2023-01-08 MED ORDER — BENZOCAINE 10 % MT GEL: Freq: Four times a day (QID) | OROMUCOSAL | 0 refills | Status: DC

## 2023-01-08 MED ORDER — SENNOSIDES-DOCUSATE SODIUM 8.6-50 MG PO TABS: 2.0000 | ORAL_TABLET | Freq: Every evening | ORAL | Status: DC | PRN

## 2023-01-08 MED ORDER — APIXABAN 2.5 MG PO TABS
2.5000 mg | ORAL_TABLET | Freq: Two times a day (BID) | ORAL | 0 refills | Status: DC
  Filled 2023-01-08 – 2023-01-09 (×2): qty 60, 30d supply, fill #0

## 2023-01-08 MED ORDER — BACLOFEN 5 MG PO TABS: 5.0000 mg | ORAL_TABLET | Freq: Four times a day (QID) | ORAL | 0 refills | Status: DC

## 2023-01-08 MED ORDER — TIZANIDINE HCL 2 MG PO TABS: 2.0000 mg | ORAL_TABLET | Freq: Three times a day (TID) | ORAL | 0 refills | Status: DC

## 2023-01-08 MED ORDER — BENZOCAINE 10 % MT GEL
Freq: Four times a day (QID) | OROMUCOSAL | 0 refills | Status: DC
  Filled 2023-01-08: qty 5.3, fill #0

## 2023-01-08 MED ORDER — NALOXONE HCL 4 MG/0.1ML NA LIQD
NASAL | 0 refills | Status: DC
  Filled 2023-01-08: qty 2, 5d supply, fill #0

## 2023-01-08 MED ORDER — MELATONIN 5 MG PO TABS: 5.0000 mg | ORAL_TABLET | Freq: Every evening | ORAL | 0 refills | Status: DC | PRN

## 2023-01-08 MED ORDER — SENNOSIDES-DOCUSATE SODIUM 8.6-50 MG PO TABS
2.0000 | ORAL_TABLET | Freq: Every evening | ORAL | 0 refills | Status: DC | PRN
  Filled 2023-01-08: qty 100, 50d supply, fill #0

## 2023-01-08 MED ORDER — OXYCODONE-ACETAMINOPHEN 5-325 MG PO TABS: 1.0000 | ORAL_TABLET | ORAL | 0 refills | Status: DC | PRN

## 2023-01-08 MED ORDER — SENNOSIDES-DOCUSATE SODIUM 8.6-50 MG PO TABS: 2.0000 | ORAL_TABLET | Freq: Every evening | ORAL | 0 refills | Status: DC | PRN

## 2023-01-08 MED ORDER — MELATONIN 5 MG PO TABS
5.0000 mg | ORAL_TABLET | Freq: Every evening | ORAL | 0 refills | Status: DC | PRN
  Filled 2023-01-08: qty 30, 30d supply, fill #0

## 2023-01-08 MED ORDER — BACLOFEN 5 MG PO TABS
10.0000 mg | ORAL_TABLET | Freq: Four times a day (QID) | ORAL | 0 refills | Status: DC
  Filled 2023-01-08: qty 120, 15d supply, fill #0

## 2023-01-08 MED ORDER — BACLOFEN 5 MG PO TABS
5.0000 mg | ORAL_TABLET | Freq: Four times a day (QID) | ORAL | 0 refills | Status: DC
  Filled 2023-01-08: qty 120, 30d supply, fill #0

## 2023-01-08 MED ORDER — APIXABAN 2.5 MG PO TABS: 2.5000 mg | ORAL_TABLET | Freq: Two times a day (BID) | ORAL | 0 refills | Status: DC

## 2023-01-08 MED ORDER — BENZOCAINE 10 % MT GEL
Freq: Four times a day (QID) | OROMUCOSAL | Status: DC
  Filled 2023-01-08: qty 9

## 2023-01-08 MED ORDER — TIZANIDINE HCL 2 MG PO TABS
2.0000 mg | ORAL_TABLET | Freq: Three times a day (TID) | ORAL | 0 refills | Status: DC
  Filled 2023-01-08: qty 120, 30d supply, fill #0

## 2023-01-08 MED ORDER — OXYCODONE-ACETAMINOPHEN 5-325 MG PO TABS
1.0000 | ORAL_TABLET | ORAL | 0 refills | Status: DC | PRN
  Filled 2023-01-08: qty 56, 5d supply, fill #0

## 2023-01-08 NOTE — Progress Notes (Addendum)
Patient ID: Mario [name], male   DOB: [birth year], 42 y.o.   MRN: [id number]  SW faxed outpatient PT/OT referral to [facility] (p:[phone]/f:[phone]).  SW received updates from PT that a RW was recommended versus RW.  SW ordered RW with [employer] via parachute. SW informed pt wife on above about RW. States they have a RW at home. SW cancelled RW order. Confirms she found a TTB, but will need 3in1 BSC. SW provided contact information for [employer],a dn also asked Lisa/[employer] to follow-up with pt wife about copay.   *SW met with pt in room to provide above updates.   Cecile Sheerer, MSW, LCSWA Office: [phone] Cell: [phone] Fax: [phone]

## 2023-01-08 NOTE — Discharge Summary (Signed)
Physician Discharge Summary  Patient ID: Mario [name] MRN: [id number] DOB/AGE: Dec 13, 1980 42 y.o.  Admit date: 12/24/2022 Discharge date: 01/09/2023  Discharge Diagnoses:  Principal Problem:   Incomplete paraplegia [facility]) Active Problems:   Thoracic myelopathy   Adjustment disorder with mixed anxiety and depressed mood   Spinal cord injury, thoracic region (HCC)   Diabetes (HCC)   Discharged Condition: stable  Significant Diagnostic Studies: VAS [place] LOWER EXTREMITY VENOUS (DVT)  Result Date: 12/26/2022  Lower Venous DVT Study Patient Name:  Mario [name]  Date of Exam:   12/25/2022 Medical Rec #: [id number]        Accession #:    [id number] Date of Birth: 1980-12-24        Patient Gender: M Patient Age:   38 years Exam Location:  [facility] Procedure:      VAS [place] LOWER EXTREMITY VENOUS (DVT) Referring Phys: Millianna Szymborski --------------------------------------------------------------------------------  Indications: Edema. Other Indications: Rehab patient. Comparison Study: Previous exam (RLEV) on 12/19/2022 was negative for DVT. Performing Technologist: Ernestene Mention RVT, RDMS  Examination Guidelines: A complete evaluation includes B-mode imaging, spectral Doppler, color Doppler, and power Doppler as needed of all accessible portions of each vessel. Bilateral testing is considered an integral part of a complete examination. Limited examinations for reoccurring indications may be performed as noted. The reflux portion of the exam is performed with the patient in reverse Trendelenburg.  +---------+---------------+---------+-----------+----------+--------------+ RIGHT    CompressibilityPhasicitySpontaneityPropertiesThrombus Aging +---------+---------------+---------+-----------+----------+--------------+ CFV      Full           Yes      Yes                                 +---------+---------------+---------+-----------+----------+--------------+ SFJ      Full                                                         +---------+---------------+---------+-----------+----------+--------------+ FV Prox  Full           Yes      Yes                                 +---------+---------------+---------+-----------+----------+--------------+ FV Mid   Full           Yes      Yes                                 +---------+---------------+---------+-----------+----------+--------------+ FV DistalFull           Yes      Yes                                 +---------+---------------+---------+-----------+----------+--------------+ PFV      Full                                                        +---------+---------------+---------+-----------+----------+--------------+ POP  Full           Yes      Yes                                 +---------+---------------+---------+-----------+----------+--------------+ PTV      Full                                                        +---------+---------------+---------+-----------+----------+--------------+ PERO     Full                                                        +---------+---------------+---------+-----------+----------+--------------+   +---------+---------------+---------+-----------+----------+--------------+ LEFT     CompressibilityPhasicitySpontaneityPropertiesThrombus Aging +---------+---------------+---------+-----------+----------+--------------+ CFV      Full           Yes      Yes                                 +---------+---------------+---------+-----------+----------+--------------+ SFJ      Full                                                        +---------+---------------+---------+-----------+----------+--------------+ FV Prox  Full           Yes      Yes                                 +---------+---------------+---------+-----------+----------+--------------+ FV Mid   Full           Yes      Yes                                  +---------+---------------+---------+-----------+----------+--------------+ FV DistalFull           Yes      Yes                                 +---------+---------------+---------+-----------+----------+--------------+ PFV      Full                                                        +---------+---------------+---------+-----------+----------+--------------+ POP      Full           Yes      Yes                                 +---------+---------------+---------+-----------+----------+--------------+ PTV      Full                                                        +---------+---------------+---------+-----------+----------+--------------+  PERO     Full                                                        +---------+---------------+---------+-----------+----------+--------------+     Summary: BILATERAL: - No evidence of deep vein thrombosis seen in the lower extremities, bilaterally. -No evidence of popliteal cyst, bilaterally.   *See table(s) above for measurements and observations. Electronically signed by Gerarda Fraction on 12/26/2022 at 8:44:14 AM.    Final    DG Abd 1 View  Result Date: 12/24/2022 CLINICAL DATA:  Constipation EXAM: ABDOMEN - 1 VIEW COMPARISON:  None Available. FINDINGS: Large colonic stool burden. Nonobstructive bowel gas pattern. Assessment of the sacrum is limited due to overlying bowel gas. No pneumatosis. No supine evidence of pneumoperitoneum. Lung bases are excluded from the field of view. No acute osseous abnormality. Degenerative changes in the lumbar spine. IMPRESSION: Large colonic stool burden Electronically Signed   By: Lorenza Cambridge M.D.   On: 12/24/2022 18:30   VAS [place] LOWER EXTREMITY VENOUS (DVT)  Result Date: 12/19/2022  Lower Venous DVT Study Patient Name:  Mario [name]  Date of Exam:   12/19/2022 Medical Rec #: [id number]        Accession #:    [id number] Date of Birth: Jan 06, 1981        Patient Gender: M Patient Age:   59 years  Exam Location:  [facility] Procedure:      VAS [place] LOWER EXTREMITY VENOUS (DVT) Referring Phys: Jason Coop --------------------------------------------------------------------------------  Indications: Right calf pain, post-op lumbar fusion surgery.  Comparison Study: No prior studies of right lower extremity. Performing Technologist: Jean Rosenthal RDMS, RVT  Examination Guidelines: A complete evaluation includes B-mode imaging, spectral Doppler, color Doppler, and power Doppler as needed of all accessible portions of each vessel. Bilateral testing is considered an integral part of a complete examination. Limited examinations for reoccurring indications may be performed as noted. The reflux portion of the exam is performed with the patient in reverse Trendelenburg.  +---------+---------------+---------+-----------+----------+--------------+ RIGHT    CompressibilityPhasicitySpontaneityPropertiesThrombus Aging +---------+---------------+---------+-----------+----------+--------------+ CFV      Full           Yes      Yes                                 +---------+---------------+---------+-----------+----------+--------------+ SFJ      Full                                                        +---------+---------------+---------+-----------+----------+--------------+ FV Prox  Full                                                        +---------+---------------+---------+-----------+----------+--------------+ FV Mid   Full                                                        +---------+---------------+---------+-----------+----------+--------------+  FV DistalFull                                                        +---------+---------------+---------+-----------+----------+--------------+ PFV      Full                                                        +---------+---------------+---------+-----------+----------+--------------+ POP      Full            Yes      Yes                                 +---------+---------------+---------+-----------+----------+--------------+ PTV      Full                                                        +---------+---------------+---------+-----------+----------+--------------+ PERO     Full                                                        +---------+---------------+---------+-----------+----------+--------------+ Gastroc  Full                                                        +---------+---------------+---------+-----------+----------+--------------+   +----+---------------+---------+-----------+----------+--------------+ LEFTCompressibilityPhasicitySpontaneityPropertiesThrombus Aging +----+---------------+---------+-----------+----------+--------------+ CFV Full           Yes      Yes                                 +----+---------------+---------+-----------+----------+--------------+     Summary: RIGHT: - There is no evidence of deep vein thrombosis in the lower extremity.  - No cystic structure found in the popliteal fossa.  LEFT: - No evidence of common femoral vein obstruction.   *See table(s) above for measurements and observations. Electronically signed by Lemar Livings MD on 12/19/2022 at 7:10:43 PM.    Final    DG Lumbar Spine 2-3 Views  Result Date: 12/16/2022 CLINICAL DATA:  Elective surgery. EXAM: LUMBAR SPINE - 2-3 VIEW COMPARISON:  Preoperative MRI FINDINGS: Three portable cross-table lateral views of the thoracic and lumbar spine obtained in the operating room. Image 1 demonstrates surgical instruments posteriorly at the T12 and L1 levels. Image 2 demonstrates surgical instruments posteriorly at the at the T9 and T10 level. Image 3 demonstrates surgical instruments posteriorly at the T8 through T10 levels. IMPRESSION: Intraoperative localization spot views during spinal surgery. Electronically Signed   By: Narda Rutherford M.D.   On: 12/16/2022 18:48    MR Lumbar Spine Wo Contrast  Result Date: 12/15/2022  CLINICAL DATA:  Low back pain, cauda equina syndrome suspected EXAM: MRI LUMBAR SPINE WITHOUT CONTRAST TECHNIQUE: Multiplanar, multisequence MR imaging of the lumbar spine was performed. No intravenous contrast was administered. COMPARISON:  MRI 10/06/2022 FINDINGS: Segmentation:  5 lumbar type vertebral bodies. Alignment:  No listhesis.  Mild dextrocurvature. Vertebrae: No acute fracture, suspicious osseous lesion, or evidence of discitis. Congenitally short pedicles, which narrow the AP diameter of the spinal canal. Conus medullaris and cauda equina: Conus extends to the L1-L2 level. Conus and cauda equina appear normal. Paraspinal and other soft tissues: Negative. Disc levels: T12-L1: No significant disc bulge. No spinal canal stenosis or neural foraminal narrowing. L1-L2: No significant disc bulge. Mild facet arthropathy. No spinal canal stenosis or neural foraminal narrowing. L2-L3: No significant disc bulge. Moderate facet arthropathy. No spinal canal stenosis or neural foraminal narrowing. L3-L4: No significant disc bulge. Moderate facet arthropathy. No spinal canal stenosis. Mild right neural foraminal narrowing. L4-L5: Minimal disc bulge. Severe facet arthropathy. No spinal canal stenosis. Mild bilateral neural foraminal narrowing. L5-S1: No significant disc bulge. Severe left and moderate right facet arthropathy. Right foraminal annular fissure, which can irritate adjacent nerve root. No spinal canal stenosis or neural foraminal narrowing. Again noted are conjoined nerve roots exiting the right neural foramen. IMPRESSION: 1. No spinal canal stenosis. 2. Mild neural foraminal narrowing on the right at L3-L4 and bilaterally at L4-L5. 3. Multilevel facet arthropathy, which is severe at L4-L5 and on the left at L5-S1, which can be a cause of back pain. Electronically Signed   By: Wiliam Ke M.D.   On: 12/15/2022 01:14   MR THORACIC SPINE WO  CONTRAST  Result Date: 12/15/2022 CLINICAL DATA:  Falls, numbness from the waist down EXAM: MRI THORACIC SPINE WITHOUT CONTRAST TECHNIQUE: Multiplanar, multisequence MR imaging of the thoracic spine was performed. No intravenous contrast was administered. COMPARISON:  10/06/2022 MRI thoracic spine and 10/27/2022 CT thoracic spine FINDINGS: Alignment: Preservation of the normal thoracic kyphosis. No listhesis. Vertebrae: Redemonstrated increased STIR signal in the bilateral pedicles of T8 and T9. There is now increased T2 signal more anteriorly in the T9 vertebral body (series 4, image 7), with approximately 30% vertebral body height loss anteriorly, eccentric to the left, favored to represent a subacute fracture. Additional increased T2 signal is now seen at the anterior aspect of T11, with right eccentric vertebral body height loss of approximately 30% (series 3, image 13), which could also represent a subacute fracture. Incidental note is made of partial cervical ribs at C7. Cord:  Normal signal and morphology. Paraspinal and other soft tissues: Negative. Disc levels: Diffuse epidural lipomatosis, which limits the space around the thecal sac at T6-T7, T7-T8, T8-T9, T9-T10, and T10-T11. In addition, at T7-T8 through T10-T11, there is severe facet arthropathy, as seen on the prior exam, which causes severe spinal canal stenosis at T6-T7, T7-T8, and T9-T10, similar to the prior exams. IMPRESSION: 1. Increased T2 signal in the T9 and T11 vertebral bodies, with approximately 30% vertebral body height loss anteriorly, favored to represent subacute fractures. 2. Unchanged increased STIR signal in the bilateral pedicles of T8 and T9, which may represent stress reaction. 3. 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 These results were called by telephone at the time of interpretation on 12/15/2022 at 1:07 am to provider [facility], who verbally acknowledged these results. Electronically Signed   By:  Wiliam Ke M.D.   On: 12/15/2022 01:07    Labs:  Basic Metabolic Panel: Recent Labs  Lab 01/05/23 0707 01/08/23 0910  NA 141 137  K 4.3 4.0  CL 105 103  CO2 28 25  GLUCOSE 112* 178*  BUN 6 7  CREATININE 1.04 0.89  CALCIUM 9.2 9.0    CBC: Recent Labs  Lab 01/05/23 0707 01/08/23 0910  WBC 7.1 6.1  HGB 12.9* 13.4  HCT 39.5 39.7  MCV 89.4 88.2  PLT 394 397    CBG: Recent Labs  Lab 01/07/23 1652 01/08/23 0601 01/08/23 1217 01/08/23 1639 01/09/23 0615  GLUCAP 92 115* 93 94 88    Brief HPI:   Braydon Wisham is a 42 y.o. male ***   Hospital Course: Aadhavan Clyatt was admitted to rehab 12/24/2022 for inpatient therapies to consist of PT, ST and OT at least three hours five days a week. Past admission physiatrist, therapy team and rehab RN have worked together to provide customized collaborative inpatient rehab.   Blood pressures were monitored on TID basis and   Diabetes has been monitored with ac/hs CBG checks and SSI was use prn for tighter BS control.    Rehab course: During patient's stay in rehab weekly team conferences were held to monitor patient's progress, set goals and discuss barriers to discharge. At admission, patient required  He/She  has had improvement in activity tolerance, balance, postural control as well as ability to compensate for deficits. He/She has had improvement in functional use RUE/LUE  and RLE/LLE as well as improvement in awareness       Disposition:  Discharge disposition: 01-Home or Self Care        Diet:  Special Instructions:  Discharge Instructions     Ambulatory referral to Occupational Therapy   Complete by: As directed    Eval and treat   Ambulatory referral to Physical Medicine Rehab   Complete by: As directed    Hospital follow up   Ambulatory referral to Physical Therapy   Complete by: As directed    Eval and treat      Allergies as of 01/09/2023   No Known Allergies      Medication List      STOP taking these medications    acetaminophen 650 MG CR tablet Commonly known as: TYLENOL Replaced by: acetaminophen 325 MG tablet   fenofibrate 145 MG tablet Commonly known as: TRICOR       TAKE these medications    acetaminophen 325 MG tablet Commonly known as: TYLENOL Take 1-2 tablets (325-650 mg total) by mouth every 4 (four) hours as needed for mild pain. Replaces: acetaminophen 650 MG CR tablet   apixaban 2.5 MG Tabs tablet Commonly known as: ELIQUIS Take 1 tablet (2.5 mg total) by mouth 2 (two) times daily.   Baclofen 5 MG Tabs Take 1 tablet (5 mg total) by mouth 4 (four) times daily. What changed:  medication strength how much to take when to take this reasons to take this   benzocaine 10 % mucosal gel Commonly known as: ORAJEL Use as directed in the mouth or throat 4 (four) times daily.   melatonin 5 MG Tabs Take 1 tablet (5 mg total) by mouth at bedtime as needed.   metFORMIN 500 MG 24 hr tablet Commonly known as: GLUCOPHAGE-XR Take 1 tablet by mouth daily with breakfast.   naloxone 4 MG/0.1ML Liqd nasal spray kit Commonly known as: NARCAN Use as needed in case of overdose   oxyCODONE-acetaminophen 5-325 MG tablet Commonly known as: PERCOCET/ROXICET Take 1-2 tablets by mouth every 4 (four) hours as needed  for severe pain. Notes to patient: Limit to 6 pills per day as needed and continue to wean off. Decrease to one rather than 2 pills at a time.    senna-docusate 8.6-50 MG tablet Commonly known as: Senokot-S Take 2 tablets by mouth at bedtime as needed for mild constipation.   tiZANidine 2 MG tablet Commonly known as: ZANAFLEX Take 1 tablet (2 mg total) by mouth 4 (four) times daily -  with meals and at bedtime.   valsartan 80 MG tablet Commonly known as: DIOVAN Take 1 tablet by mouth daily.        Follow-up Information     [facility]   Specialty: Family Medicine Why: Call in 1-2 days for post hospital  follow up Contact information: [address] [place] [address] [phone]         Genice Rouge, MD Follow up.   Specialty: Physical Medicine and Rehabilitation Why: office will call you with follow up appointment Contact information: [address] [place] [address] [phone]         Estill Bamberg, MD Follow up.   Specialty: Orthopedic Surgery Why: Call in 1-2 days for post hospital follow up and for pain medication refills Contact information: [address] [place] [address] [phone]                 Signed: [name] 01/09/2023, 10:20 AM

## 2023-01-08 NOTE — Progress Notes (Signed)
Occupational Therapy Discharge Summary  Patient Details  Name: Mario [name] MRN: [id number] Date of Birth: 03/09/1981  Date of Discharge from OT service:January 08, 2023   Patient has met 7 of 7 long term goals due to improved activity tolerance, improved balance, and postural control.  Pt made excellent progress with BADLs and functional transfers during this admission. Pt completes BADLs and transfers with supervision. Pt's wife has been present and participated during therapy sessions. Patient to discharge at overall Supervision level.  Patient's care partner is independent to provide the necessary physical assistance at discharge.    Reasons goals not met: n/a  Recommendation:  No f/u recommended at this time.   Equipment: No equipment provided  Reasons for discharge: treatment goals met and discharge from hospital  Patient/family agrees with progress made and goals achieved: Yes  OT Discharge ADL ADL Equipment Provided: Reacher, Long-handled sponge Eating: Independent Where Assessed-Eating: Wheelchair Grooming: Modified independent Where Assessed-Grooming: Sitting at sink Upper Body Bathing: Modified independent Where Assessed-Upper Body Bathing: Shower Lower Body Bathing: Supervision/safety Where Assessed-Lower Body Bathing: Shower Upper Body Dressing: Independent Where Assessed-Upper Body Dressing: Chair Lower Body Dressing: Minimal assistance Where Assessed-Lower Body Dressing: Standing at sink, Sitting at sink Toileting: Supervision/safety Where Assessed-Toileting: Teacher, adult education: Close supervision Toilet Transfer Method: Proofreader: Drop arm bedside commode Tub/Shower Transfer: Moderate assistance Tub/Shower Transfer Method: Stand pivot Tub/Shower Equipment: Insurance underwriter: Close supervision Film/video editor Method: Paramedic ADL Comments:  Slow stepping noted with transfers, heavy reliance on UE on RW for transfers, safe transfer techniquem increased assistance required with LB ADLs Vision Baseline Vision/History: 0 No visual deficits Patient Visual Report: No change from baseline Vision Assessment?: No apparent visual deficits Perception  Perception: Within Functional Limits Praxis Praxis: WFL Cognition Cognition Overall Cognitive Status: Within Functional Limits for tasks assessed Arousal/Alertness: Awake/alert Orientation Level: Person;Situation;Place Person: Oriented Place: Oriented Situation: Oriented Memory: Appears intact Attention: Sustained Sustained Attention: Appears intact Awareness: Appears intact Problem Solving: Appears intact Safety/Judgment: Appears intact Brief Interview for Mental Status (BIMS) Repetition of Three Words (First Attempt): 3 Temporal Orientation: Year: Correct Temporal Orientation: Month: Accurate within 5 days Temporal Orientation: Day: Correct Recall: "Sock": Yes, no cue required Recall: "Blue": Yes, no cue required Recall: "Bed": Yes, no cue required BIMS Summary Score: 15 Sensation Sensation Light Touch: Impaired by gross assessment Central sensation comments: numbness/tingling in BLE, reports most ingling in bottom of feet/toes Light Touch Impaired Details: Impaired RLE;Impaired LLE Hot/Cold: Appears Intact Proprioception: Appears Intact Stereognosis: Not tested Coordination Gross Motor Movements are Fluid and Coordinated: Yes Fine Motor Movements are Fluid and Coordinated: Yes Coordination and Movement Description: BUE WFL, BLE impaired Motor  Motor Motor: Paraplegia;Ataxia;Abnormal tone Motor - Skilled Clinical Observations: impaired BLE d/t compression, WFL BUE Motor - Discharge Observations: Ataxia present but improved, pt aware and able to correct when needed Trunk/Postural Assessment  Cervical Assessment Cervical Assessment: Within Functional  Limits Thoracic Assessment Thoracic Assessment: Exceptions to [facility] (back precautions) Lumbar Assessment Lumbar Assessment: Exceptions to [facility] (back precautions) Postural Control Postural Control: Deficits on evaluation  Balance Static Sitting Balance Static Sitting - Balance Support: Feet supported Static Sitting - Level of Assistance: 7: Independent Dynamic Sitting Balance Dynamic Sitting - Balance Support: During functional activity Dynamic Sitting - Level of Assistance: 6: Modified independent (Device/Increase time) Extremity/Trunk Assessment RUE Assessment RUE Assessment: Within Functional Limits Active Range of Motion (AROM) Comments: WFL General Strength Comments: 4+/5 UE overall LUE Assessment  LUE Assessment: Within Functional Limits Active Range of Motion (AROM) Comments: [facility] General Strength Comments: 4+/5 UE overall   [name] 01/08/2023, 6:58 AM

## 2023-01-08 NOTE — Progress Notes (Signed)
Occupational Therapy Session Note  Patient Details  Name: Mario [name] MRN: [id number] Date of Birth: June 19, 1980  Today's Date: 01/08/2023 OT Individual Time: 0700-0758 OT Individual Time Calculation (min): 58 min    Short Term Goals: Week 2:  OT Short Term Goal 1 (Week 2): STG=LTG 2/2 ELOS  Skilled Therapeutic Interventions/Progress Updates:    Pt sleeping in bed upon arrival but easily aroused. OT intervention with focus on BADLs and functional transfers. Amb with RW to bathroom to use toilet before transferring to shower for bathing. Pt completed bathing with sit<>stand from TTB at shower. Pt amb with RW back to room to complete dressing tasks. Sit<>stand and all amb at supervision level. Pt requires assistance for donning socks/shoes without AE but completes all other BADLs with supervision. Pt remained in w/c with all needs within reach.   Therapy Documentation Precautions:  Precautions Precautions: Back, Fall Precaution Comments: reviewed back precautions Required Braces or Orthoses: Spinal Brace Spinal Brace: Thoracolumbosacral orthotic, Applied in sitting position, Other (comment) Spinal Brace Comments: no brace ordered, but pt prefers for comfort Restrictions Weight Bearing Restrictions: No Pain:  Pt reports some "stiffness" but denies pain   Therapy/Group: Individual Therapy  [name] 01/08/2023, 7:59 AM

## 2023-01-08 NOTE — Discharge Instructions (Addendum)
Inpatient Rehab Discharge Instructions  Mario [name] Discharge date and time: 01/09/23   Activities/Precautions/ Functional Status: Activity: no lifting, driving, or strenuous exercise till cleared by MD Diet: diabetic diet Wound Care: keep wound clean and dry   Functional status:  ___ No restrictions     ___ Walk up steps independently _X__ 24/7 supervision/assistance   ___ Walk up steps with assistance ___ Intermittent supervision/assistance  ___ Bathe/dress independently _X__ Walk with walker    _X__ Bathe/dress with assistance ___ Walk Independently    ___ Shower independently ___ Walk with assistance    ___ Shower with assistance ___ No alcohol     ___ Return to work/school ________   Special Instructions:   COMMUNITY REFERRALS UPON DISCHARGE:    Outpatient: PT     OT             Agency:[facility]       Phone:[phone]              Appointment Date/Time:*Please expect follow-up within 7-10 business days to schedule your appointment. If  you have not received follow-up, be sure to contact the site directly.*  Medical Equipment/Items Ordered:3in1 bedside commode                                                 Agency/Supplier:[facility] [phone]    My questions have been answered and I understand these instructions. I will adhere to these goals and the provided educational materials after my discharge from the hospital.  Patient/Caregiver Signature _______________________________ Date __________  Clinician Signature _______________________________________ Date __________  Please bring this form and your medication list with you to all your follow-up doctor's appointments.   Information on my medicine - ELIQUIS (apixaban)  Why was Eliquis prescribed for you? Eliquis was prescribed for you to reduce the risk of blood clots forming after orthopedic surgery.    What do You need to know about Eliquis? Take your Eliquis TWICE DAILY - one  tablet in the morning and one tablet in the evening with or without food.  It would be best to take the dose about the same time each day.  If you have difficulty swallowing the tablet whole please discuss with your pharmacist how to take the medication safely.  Take Eliquis exactly as prescribed by your doctor and DO NOT stop taking Eliquis without talking to the doctor who prescribed the medication.  Stopping without other medication to take the place of Eliquis may increase your risk of developing a clot.  After discharge, you should have regular check-up appointments with your healthcare provider that is prescribing your Eliquis.  What do you do if you miss a dose? If a dose of ELIQUIS is not taken at the scheduled time, take it as soon as possible on the same day and twice-daily administration should be resumed.  The dose should not be doubled to make up for a missed dose.  Do not take more than one tablet of ELIQUIS at the same time.  Important Safety Information A possible side effect of Eliquis is bleeding. You should call your healthcare provider right away if you experience any of the following: Bleeding from an injury or your nose that does not stop. Unusual colored urine (red or dark brown) or unusual colored stools (red or black). Unusual bruising for unknown reasons. A serious  fall or if you hit your head (even if there is no bleeding).  Some medicines may interact with Eliquis and might increase your risk of bleeding or clotting while on Eliquis. To help avoid this, consult your healthcare provider or pharmacist prior to using any new prescription or non-prescription medications, including herbals, vitamins, non-steroidal anti-inflammatory drugs (NSAIDs) and supplements.  This website has more information on Eliquis (apixaban): http://[email or web address].com/eliquis/home

## 2023-01-08 NOTE — Progress Notes (Signed)
PROGRESS NOTE   Subjective/Complaints:  Pt reports baclofen q6 hours working much better- doesn't feel like needs to increase Baclofen/Zanaflex.  Tooth broke off and L side of face/head hurts- cannot do NSAIDs due to Eliquis.  Added Oragel.  Having BM currently- on toilet.    ROS:    Pt denies SOB, abd pain, CP, N/V/C/D, and vision changes  Spasms (+)- better  Except for HPI  Objective:   No results found. No results for input(s): "WBC", "HGB", "HCT", "PLT" in the last 72 hours.     No results for input(s): "NA", "K", "CL", "CO2", "GLUCOSE", "BUN", "CREATININE", "CALCIUM" in the last 72 hours.      Intake/Output Summary (Last 24 hours) at 01/08/2023 0917 Last data filed at 01/08/2023 0843 Gross per 24 hour  Intake 390 ml  Output 400 ml  Net -10 ml        Physical Exam: Vital Signs Blood pressure (!) 143/72, pulse 63, temperature 97.8 F (36.6 C), resp. rate 15, height 6\' 1"  (1.854 m), weight 126.6 kg, SpO2 100%.        General: awake, alert, appropriate, sitting on toilet having BM- OT in room; NAD HENT: conjugate gaze; oropharynx moist CV: regular rate and rhythm; no JVD Pulmonary: CTA B/L; no W/R/R- good air movement GI: soft, NT, ND, (+)BS Psychiatric: appropriate Neurological: Ox3 Fewer spasms seen A few spasms seen spontaneously- and clonus worse this AM- 6-7 beats B/L  Skin: Clean and intact without signs of breakdown. Back incision steristrips in place  PRIOR EXAMS: Neuro:  Alert and oriented x 3. Normal insight and awareness. Intact Memory. Normal language and speech. Cranial nerve exam unremarkable. MMT: UE motor 5/5. LLE 3- to 3/5 HF, KE and 1+ PF and tr-1 ADF. RLE: 3- to 3/5 HF, KE and 1 APF, tr ADF.  T8 sensory level. Can sense LT and pain in both legs, lesser on feet?, DTR's 1+, no resting tone today. No clonus appreciated Musculoskeletal: back in TLSO, tender with sitting.    Spasms much better controlled- no spontaneous spasms-stable   Assessment/Plan: 1. Functional deficits which require 3+ hours per day of interdisciplinary therapy in a comprehensive inpatient rehab setting. Physiatrist is providing close team supervision and 24 hour management of active medical problems listed below. Physiatrist and rehab team continue to assess barriers to discharge/monitor patient progress toward functional and medical goals  Care Tool:  Bathing    Body parts bathed by patient: Right arm, Face, Left arm, Chest, Abdomen, Right upper leg, Left upper leg, Front perineal area, Buttocks, Right lower leg, Left lower leg   Body parts bathed by helper: Right lower leg, Left lower leg     Bathing assist Assist Level: Supervision/Verbal cueing     Upper Body Dressing/Undressing Upper body dressing   What is the patient wearing?: Pull over shirt    Upper body assist Assist Level: Independent    Lower Body Dressing/Undressing Lower body dressing      What is the patient wearing?: Underwear/pull up, Pants     Lower body assist Assist for lower body dressing: Supervision/Verbal cueing     Toileting Toileting    Toileting assist Assist for toileting:  Supervision/Verbal cueing     Transfers Chair/bed transfer  Transfers assist     Chair/bed transfer assist level: Supervision/Verbal cueing     Locomotion Ambulation   Ambulation assist      Assist level: Contact Guard/Touching assist Assistive device: Walker-rolling Max distance: 100   Walk 10 feet activity   Assist     Assist level: Contact Guard/Touching assist Assistive device: Walker-rolling   Walk 50 feet activity   Assist Walk 50 feet with 2 turns activity did not occur: Safety/medical concerns  Assist level: Contact Guard/Touching assist Assistive device: Walker-rolling    Walk 150 feet activity   Assist Walk 150 feet activity did not occur: Safety/medical concerns          Walk 10 feet on uneven surface  activity   Assist Walk 10 feet on uneven surfaces activity did not occur: Safety/medical concerns   Assist level: Minimal Assistance - Patient > 75% Assistive device: Walker-rolling   Wheelchair     Assist Is the patient using a wheelchair?: Yes Type of Wheelchair: Manual    Wheelchair assist level: Set up assist (for leg reacher) Max wheelchair distance: 100    Wheelchair 50 feet with 2 turns activity    Assist        Assist Level: Independent   Wheelchair 150 feet activity     Assist      Assist Level: Independent   Blood pressure (!) 143/72, pulse 63, temperature 97.8 F (36.6 C), resp. rate 15, height 6\' 1"  (1.854 m), weight 126.6 kg, SpO2 100%.  Medical Problem List and Plan: 1. Functional deficits secondary to T7 thoracic spinal cord injury             -patient may shower             -ELOS/Goals: modI 10-14 days            D/c 9/20  Con't CIR PT and OT  Will need f/u with Dr [name] 2.  Antithrombotics: -DVT/anticoagulation:  DVT study neg 12/27/22; Mechanical: Sequential compression devices, below knee Bilateral lower extremities; now on lovenox 30mg  q12h             -9/13- will go home on Eliquis if not walking >300 ft at a time  9/18- switched to Eliquis 2.5 mg BID since walking max 100 ft at a time- will need another 6 weeks of prevention/meds 3. Pain Management:  PRN oxycodone seems to be helping to control pain with activity  -9/12- pain worse this AM but thinks overdid it yesterday- taking ~ 3-4x in 24 hours- usually 10 mg  9/16- 9/17pain much better- doesn't need TrP injections per pt  9/19- pain better- still using Oxy 2 tabs ~ 3-4x/day usually- can send home on it-  4. Mood/Behavior/Sleep: LCSW to follow for evaluation and support.              -antipsychotic agents: N/A             --trazodone prn for insomnia.   -12/27/22 added melatonin 5mg  QHS PRN to help with sleep-improved  9/11- Didn't sleep well-  will make sure has trazodone prn- take by 11pm  9/16- sleeping well per pt 5. Neuropsych/cognition: This patient is capable of making decisions on his own behalf. 6. Skin/Wound Care: Routine pressure relief measures. 7. Fluids/Electrolytes/Nutrition: encourage PO  -mild hyponatremia--will recheck on Monday. No obvious causes at this time  9/9- Na 136 this AM 8. HTN: Monitor BP TID--continue avapro 75mg  daily  for diovan  -9/7-8/24 BPs doing good, cont regimen  9/9- BP's a little on high side- 120s-140s systolic- wil monitor for trend- make sure, although unlikely, that's it's not AD.  9/18- BP usually controlled- occ 140s systolic- con't regimen Vitals:   01/04/23 0415 01/04/23 1317 01/04/23 2005 01/05/23 0321  BP: (!) 127/56 135/83 (!) 144/85 130/82   01/05/23 1300 01/05/23 2021 01/06/23 0501 01/06/23 1439  BP: 126/70 (!) 158/91 137/77 116/69   01/06/23 2009 01/07/23 0542 01/07/23 1353 01/08/23 0523  BP: (!) 143/81 (!) 122/54 (!) 148/76 (!) 143/72    9. T2DM: Hgb A1c 6.7- - Monitor BS ac/hs and use SSI for elevated BS             --continue metformin XR 500mg  daily  -9/11- will reduce CBGs to BID before breakfast and lunch  9/12- had BG of 79-83 yesterday afternoon/evening- was given OJ just in case  9/13- Cbgs looking better- will likely just go home on Metformin long acting  -01/03/23 CBGs looking great, hasn't needed insulin since 9/12-monitor  -01/04/23- 9/16 CBGs great again, needed 1U novolog yesterday afternoon  9/17-9/18 CBGs looking great- con't regimen CBG (last 3)  Recent Labs    01/07/23 1208 01/07/23 1652 01/08/23 0601  GLUCAP 90 92 115*     10. Neurogenic bowel: - Enema administered with only small result-->KUB showed moderate to large stool burden.              --continue Senna S to 2 tabs bid -schedule miralax starting tomorrow -9/6 give sorbitol 60cc today, SSE if needed in pm  -12/28/22 LBM last night, moving bowels better, cont regimen 9/9- LBM 1-2 days ago,  however getting to bathroom this AM to have BM with OT 9/19- going daily- going this AM 11. Spasticity: continue baclofen 5mg  QID--this dose seems to be working for him without causing too much fatigue or weakness-- getting it nightly now, and TID PRN.    9/9- pt reports that was getting 10 mg QID, however felt too weak- ok with scheduling Baclofen since spasms out of control on prn/at bedtime- changed to 5 mg QID  9/10- will add Zanaflex 2 mg TID and another 2 mg at bedtime to help spasms- my concern is that pt might be developing an illness- sometimes spike in spasms can be from infection brewing, but he feels well right now- educated pt on this concept.   9/11- MUCH better today-   9/12- spasms doing better with addition/increase of Zanaflex  9/17- having more clonus- but not painful or getting in way of function- will wait to increase meds for today but monitor trend/closely.   9/18- will change Baclofen to q6 hours to cover overnight when having severe spasms- if doesn't work, will increase Zanaflex to 4 mg in AM and afternoon and 6 mg at bedtime  9/19- maintain Spasticity meds since q6 hours doing well 12. Myofascial pain  9/11- having pain in lateral scapula/teres- will get therapy to do myofascial release due to trigger point injections  9/12- Theracane lent to pt- is helping some. 9/13- pt asking for more myofasical release- d/w OT  9/17- doing much better-  13. Tooth fracture  9/19- will add Oragel QID- cannot use NSAIDs for swelling since on Eliquis-   I spent a total of 36   minutes on total care today- >50% coordination of care- due to  D/w pt about NSAIDs - cannot do- also tooth fracture; and d/w OT about function-  Leave spasticity  meds same.    LOS: 15 days A FACE TO FACE EVALUATION WAS PERFORMED  Mario [name] 01/08/2023, 9:17 AM

## 2023-01-08 NOTE — Progress Notes (Signed)
Occupational Therapy Session Note  Patient Details  Name: Mario [name] MRN: [id number] Date of Birth: 12-07-1980  Today's Date: 01/08/2023 OT Co-Treatment Time: 1030-1200 OT Co-Treatment Time Calculation (min): 90 min Co-tx with Recreational therapist Total time 90 mins  Short Term Goals: Week 2:  OT Short Term Goal 1 (Week 2): STG=LTG 2/2 ELOS  Skilled Therapeutic Interventions/Progress Updates:    Co-tx with Recreational Therapist for community reintegration outing. Transport to [employer] via [employer] using w/c for transport. Pt transferred to passenger seat with CGA from w/c in [place]. Pt propelled w/c throughout store with supervision. Pt stood X 2 and amb with RW to gather items from shelving. Discussed energy conservation strategies. Accessed public restroom at w/c level. Recommended pt have assistance when using public facilities. Pt's wife present during outing. Pt returned to room and remained in w/c with all needs wihtin reach. Copy of goal sheet in shadow chart.  Therapy Documentation Precautions:  Precautions Precautions: Back, Fall Precaution Comments: reviewed back precautions Required Braces or Orthoses: Spinal Brace Spinal Brace: Thoracolumbosacral orthotic, Applied in sitting position, Other (comment) Spinal Brace Comments: no brace ordered, but pt prefers for comfort Restrictions Weight Bearing Restrictions: No Pain: Pt c/o 3/10 BLE pain (spasms); meds admin prior to therapy  Therapy/Group: Individual Therapy  [name] 01/08/2023, 2:44 PM

## 2023-01-08 NOTE — Progress Notes (Signed)
Recreational Therapy Session Note  Patient Details  Name: Mario [name] MRN: [id number] Date of Birth: Sep 18, 1980 Today's Date: 01/08/2023  Pain: no c/o, pt premedicated Skilled Therapeutic Interventions/Progress Updates: Pt participated in Community reintegration/outing to [place] at overall supervision w/c level.  Pt did ambulate short distances with RW, forwards, backward, turning to retrieve items from various heights of shelving with contact guard assist.  Goals focused on safe community mobility, identification & negotiation of obstacles, accessing public restroom, energy conservation techniques/education. Pts wife present and observing community mobility. See outing goal sheet in shadow chart for full details.  Therapy/Group: [employer]   [name] 01/08/2023, 12:22 PM

## 2023-01-09 ENCOUNTER — Other Ambulatory Visit (HOSPITAL_COMMUNITY): Payer: Self-pay

## 2023-01-09 LAB — GLUCOSE, CAPILLARY: Glucose-Capillary: 88 mg/dL (ref 70–99)

## 2023-01-09 MED ORDER — NALOXONE HCL 4 MG/0.1ML NA LIQD: NASAL | 0 refills | Status: DC

## 2023-01-09 NOTE — Progress Notes (Signed)
Inpatient Rehabilitation Care Coordinator Discharge Note   Patient Details  Name: Mario [name] MRN: [id number] Date of Birth: Feb 07, 1981   Discharge location: D/c to home  Length of Stay: 15 days  Discharge activity level: Supervision  Home/community participation: Limited  Patient response GM:WNUUVO Literacy - How often do you need to have someone help you when you read instructions, pamphlets, or other written material from your doctor or pharmacy?: Never  Patient response ZD:GUYQIH Isolation - How often do you feel lonely or isolated from those around you?: Never  Services provided included: MD, RD, PT, OT, RN, TR, Pharmacy, Neuropsych, SW, CM, SLP  Financial Services:  Field seismologist Utilized: [employer]  Choices offered to/list presented to: patient wife  Follow-up services arranged:  Outpatient, DME    Outpatient Servicies: [facility] for outpatient PT/OT DME : 3in1 BSC with [facility]; family able to obtain TTB, and has RW already    Patient response to transportation need: Is the patient able to respond to transportation needs?: Yes In the past 12 months, has lack of transportation kept you from medical appointments or from getting medications?: No In the past 12 months, has lack of transportation kept you from meetings, work, or from getting things needed for daily living?: No   Patient/Family verbalized understanding of follow-up arrangements:  Yes  Individual responsible for coordination of the follow-up plan: contact pt [phone]  Confirmed correct DME delivered: Gretchen Short 01/09/2023    Comments (or additional information):fam edu  Summary of Stay    Date/Time Discharge Planning CSW  01/06/23 0921 D/c plan remains to be home with his wife who will provide 24/7 care and PRN support from sons. Fam edu on 9/18 9am-12pm with patient's wife. SW will confirm there are no barriers to discharge. AAC  12/29/22 0946 Pt will  discharge to home with his wife who will provide 24/7 care and PRN support from sons. SW will confirm there are no barriers to discharge. AAC       Mario [name] A Mario [name]

## 2023-01-09 NOTE — Progress Notes (Signed)
PROGRESS NOTE   Subjective/Complaints:  Pt up in bathroom emptying. No problems this morning. Feels well. Excited to be going home tomorrow  ROS: Patient denies fever, rash, sore throat, blurred vision, dizziness, nausea, vomiting, diarrhea, cough, shortness of breath or chest pain, joint or back/neck pain, headache, or mood change.   Objective:   No results found. Recent Labs    01/08/23 0910  WBC 6.1  HGB 13.4  HCT 39.7  PLT 397       Recent Labs    01/08/23 0910  NA 137  K 4.0  CL 103  CO2 25  GLUCOSE 178*  BUN 7  CREATININE 0.89  CALCIUM 9.0        Intake/Output Summary (Last 24 hours) at 01/09/2023 1017 Last data filed at 01/08/2023 1823 Gross per 24 hour  Intake 716 ml  Output --  Net 716 ml        Physical Exam: Vital Signs Blood pressure (!) 144/81, pulse (!) 59, temperature 98.7 F (37.1 C), temperature source Oral, resp. rate 19, height 6\' 1"  (1.854 m), weight 126.6 kg, SpO2 99%.     Constitutional: No distress . Vital signs reviewed. HEENT: NCAT, EOMI, oral membranes moist Neck: supple Cardiovascular: RRR without murmur. No JVD    Respiratory/Chest: CTA Bilaterally without wheezes or rales. Normal effort    GI/Abdomen: BS +, non-tender, non-distended Ext: no clubbing, cyanosis, or edema Psych: pleasant and cooperative  Skin: Clean and intact without signs of breakdown. Back incision steristrips in place  PRIOR EXAMS: Neuro:  Alert and oriented x 3. Normal insight and awareness. Intact Memory. Normal language and speech. Cranial nerve exam unremarkable.   Marland Kitchen MMT: UE motor 5/5. LLE 3- to 3/5 HF, KE and 1+ PF and tr-1 ADF. RLE: 3- to 3/5 HF, KE and 1 APF, tr ADF.  T8 sensory level. Can sense LT and pain in both legs, lesser on feet?, DTR's 1+ to 2+,with clonus   Musculoskeletal: back in TLSO, tender with sitting.      Assessment/Plan: 1. Functional deficits which require 3+ hours  per day of interdisciplinary therapy in a comprehensive inpatient rehab setting. Physiatrist is providing close team supervision and 24 hour management of active medical problems listed below. Physiatrist and rehab team continue to assess barriers to discharge/monitor patient progress toward functional and medical goals  Care Tool:  Bathing    Body parts bathed by patient: Right arm, Face, Left arm, Chest, Abdomen, Right upper leg, Left upper leg, Front perineal area, Buttocks, Right lower leg, Left lower leg   Body parts bathed by helper: Right lower leg, Left lower leg     Bathing assist Assist Level: Supervision/Verbal cueing     Upper Body Dressing/Undressing Upper body dressing   What is the patient wearing?: Pull over shirt    Upper body assist Assist Level: Independent    Lower Body Dressing/Undressing Lower body dressing      What is the patient wearing?: Underwear/pull up, Pants     Lower body assist Assist for lower body dressing: Supervision/Verbal cueing     Toileting Toileting    Toileting assist Assist for toileting: Supervision/Verbal cueing  Transfers Chair/bed transfer  Transfers assist     Chair/bed transfer assist level: Supervision/Verbal cueing     Locomotion Ambulation   Ambulation assist      Assist level: Supervision/Verbal cueing Assistive device: Walker-rolling Max distance: 150   Walk 10 feet activity   Assist     Assist level: Contact Guard/Touching assist Assistive device: Walker-rolling   Walk 50 feet activity   Assist Walk 50 feet with 2 turns activity did not occur: Safety/medical concerns  Assist level: Supervision/Verbal cueing Assistive device: Walker-rolling    Walk 150 feet activity   Assist Walk 150 feet activity did not occur: Safety/medical concerns  Assist level: Supervision/Verbal cueing Assistive device: Walker-rolling    Walk 10 feet on uneven surface  activity   Assist Walk 10  feet on uneven surfaces activity did not occur: Safety/medical concerns   Assist level: Contact Guard/Touching assist Assistive device: Walker-rolling   Wheelchair     Assist Is the patient using a wheelchair?: Yes Type of Wheelchair: Manual    Wheelchair assist level: Set up assist Max wheelchair distance: 100    Wheelchair 50 feet with 2 turns activity    Assist        Assist Level: Independent   Wheelchair 150 feet activity     Assist      Assist Level: Independent   Blood pressure (!) 144/81, pulse (!) 59, temperature 98.7 F (37.1 C), temperature source Oral, resp. rate 19, height 6\' 1"  (1.854 m), weight 126.6 kg, SpO2 99%.  Medical Problem List and Plan: 1. Functional deficits secondary to T7 thoracic spinal cord injury             -dc home today!  Will need f/u with Dr [name] 2.  Antithrombotics: -DVT/anticoagulation:  DVT study neg 12/27/22; Mechanical: Sequential compression devices, below knee Bilateral lower extremities; now on lovenox 30mg  q12h             -9/13- will go home on Eliquis if not walking >300 ft at a time  9/18- switched to Eliquis 2.5 mg BID since walking max 100 ft at a time- will need another 6 weeks of prevention/meds 3. Pain Management:  PRN oxycodone seems to be helping to control pain with activity  -9/12- pain worse this AM but thinks overdid it yesterday- taking ~ 3-4x in 24 hours- usually 10 mg  9/16- 9/17pain much better- doesn't need TrP injections per pt  9/19-20- pain better- still using Oxy 2 tabs ~ 3-4x/day usually- can send home on it-  4. Mood/Behavior/Sleep: LCSW to follow for evaluation and support.              -antipsychotic agents: N/A             --trazodone prn for insomnia.   -12/27/22 added melatonin 5mg  QHS PRN to help with sleep-improved  9/11- Didn't sleep well- will make sure has trazodone prn- take by 11pm  9/20- sleeping well per pt 5. Neuropsych/cognition: This patient is capable of making decisions  on his own behalf. 6. Skin/Wound Care: Routine pressure relief measures. 7. Fluids/Electrolytes/Nutrition: encourage PO  -mild hyponatremia--will recheck on Monday. No obvious causes at this time  9/9- Na 136 this AM 8. HTN: Monitor BP TID--continue avapro 75mg  daily for diovan  -9/7-8/24 BPs doing good, cont regimen  9/9- BP's a little on high side- 120s-140s systolic- wil monitor for trend- make sure, although unlikely, that's it's not AD.  9/20 bp's fair to borderline--f/u as outpt Vitals:  01/05/23 0321 01/05/23 1300 01/05/23 2021 01/06/23 0501  BP: 130/82 126/70 (!) 158/91 137/77   01/06/23 1439 01/06/23 2009 01/07/23 0542 01/07/23 1353  BP: 116/69 (!) 143/81 (!) 122/54 (!) 148/76   01/08/23 0523 01/08/23 1432 01/08/23 2001 01/09/23 0550  BP: (!) 143/72 (!) 141/74 (!) 143/61 (!) 144/81    9. T2DM: Hgb A1c 6.7- - Monitor BS ac/hs and use SSI for elevated BS             --continue metformin XR 500mg  daily  -9/11- will reduce CBGs to BID before breakfast and lunch  9/12- had BG of 79-83 yesterday afternoon/evening- was given OJ just in case  9/13- Cbgs looking better- will likely just go home on Metformin long acting  -01/03/23 CBGs looking great, hasn't needed insulin since 9/12-monitor  -01/04/23- 9/16 CBGs great again, needed 1U novolog yesterday afternoon  9/20 tight control on above regimen CBG (last 3)  Recent Labs    01/08/23 1217 01/08/23 1639 01/09/23 0615  GLUCAP 93 94 88     10. Neurogenic bowel: - Enema administered with only small result-->KUB showed moderate to large stool burden.              --continue Senna S to 2 tabs bid -schedule miralax starting tomorrow -9/6 give sorbitol 60cc today, SSE if needed in pm  -12/28/22 LBM last night, moving bowels better, cont regimen 9/9- LBM 1-2 days ago, however getting to bathroom this AM to have BM with OT 9/20- going daily- going this AM when I came in room. 11. Spasticity: continue baclofen 5mg  QID--this dose seems to  be working for him without causing too much fatigue or weakness-- getting it nightly now, and TID PRN.    9/9- pt reports that was getting 10 mg QID, however felt too weak- ok with scheduling Baclofen since spasms out of control on prn/at bedtime- changed to 5 mg QID  9/10- will add Zanaflex 2 mg TID and another 2 mg at bedtime to help spasms- my concern is that pt might be developing an illness- sometimes spike in spasms can be from infection brewing, but he feels well right now- educated pt on this concept.   9/11- MUCH better today-   9/12- spasms doing better with addition/increase of Zanaflex  9/17- having more clonus- but not painful or getting in way of function- will wait to increase meds for today but monitor trend/closely.   9/18- will change Baclofen to q6 hours to cover overnight when having severe spasms- if doesn't work, will increase Zanaflex to 4 mg in AM and afternoon and 6 mg at bedtime  9/20 spasms controlled on q6 hour baclofen 12. Myofascial pain  9/11- having pain in lateral scapula/teres- will get therapy to do myofascial release due to trigger point injections  9/12- Theracane lent to pt- is helping some. 9/13- pt asking for more myofasical release- d/w OT  9/17- doing much better-  13. Tooth fracture  9/19-  Oragel QID- cannot use NSAIDs for swelling since on Eliquis-   9/20 f/u with dentistry as outpt       LOS: 16 days A FACE TO FACE EVALUATION WAS PERFORMED  Ranelle Oyster 01/09/2023, 10:17 AM

## 2023-01-09 NOTE — Progress Notes (Signed)
Recreational Therapy Discharge Summary Patient Details  Name: Mario [name] MRN: [id number] Date of Birth: Jul 04, 1980 Today's Date: 01/09/2023 Comments on progress toward goals: Pt has made great progress during LOS and is discharging home today with family to provide the needed supervision.  TR sessions focused on pt education and community reintegration.  Pt participated in an outing to [employer] at overall supervision w/c level, contact guard assist for short distance ambulation with RW. Reasons for discharge: discharge from hospital  Follow-up: Outpatient  Patient/family agrees with progress made and goals achieved: Yes  Jennaya Pogue 01/09/2023, 1:28 PM

## 2023-01-09 NOTE — Progress Notes (Signed)
Patient ID: Mario [name], male   DOB: 02-18-1981, 42 y.o.   MRN: [id number]  INPATIENT REHABILITATION DISCHARGE NOTE   Discharge instructions by: Marissa Nestle, PA-C  Verbalized understanding: Yes  Skin care/Wound care healing: Incision healing  Pain: Medicated  IV's: NA  Tubes/Drains:NA  O2:NA  Safety instructions: provided   Patient belongings: returned  Discharged to: home  Discharged via: car  Notes: TOC meds picked up by patient.

## 2023-01-09 NOTE — Progress Notes (Signed)
Inpatient Rehabilitation Discharge Medication Review by a Pharmacist  A complete drug regimen review was completed for this patient to identify any potential clinically significant medication issues.  High Risk Drug Classes Is patient taking? Indication by Medication  Antipsychotic No   Anticoagulant Yes Eliquis - VTE ppx  Antibiotic No   Opioid Yes Oxycodone/APAP - pain  Antiplatelet No   Hypoglycemics/insulin Yes Metformin - T2DM  Vasoactive Medication Yes Valsartan - HTN  Chemotherapy No   Other Yes Tizanidine, baclofen - muscle spasms Melatonin - sleep     Type of Medication Issue Identified Description of Issue Recommendation(s)  Drug Interaction(s) (clinically significant)     Duplicate Therapy     Allergy     No Medication Administration End Date     Incorrect Dose     Additional Drug Therapy Needed     Significant med changes from prior encounter (inform family/care partners about these prior to discharge).    Other       Clinically significant medication issues were identified that warrant physician communication and completion of prescribed/recommended actions by midnight of the next day:  No  Name of provider notified for urgent issues identified:   Provider Method of Notification:   Pharmacist comments:   Time spent performing this drug regimen review (minutes):  15  Rexford Maus, PharmD, BCPS 01/09/2023 8:25 AM

## 2023-01-22 ENCOUNTER — Ambulatory Visit: Payer: BC Managed Care – PPO | Attending: Physical Medicine and Rehabilitation | Admitting: Physical Therapy

## 2023-01-22 ENCOUNTER — Telehealth: Payer: Self-pay | Admitting: Physical Therapy

## 2023-01-22 DIAGNOSIS — R269 Unspecified abnormalities of gait and mobility: Secondary | ICD-10-CM | POA: Insufficient documentation

## 2023-01-22 DIAGNOSIS — R29898 Other symptoms and signs involving the musculoskeletal system: Secondary | ICD-10-CM | POA: Insufficient documentation

## 2023-01-22 DIAGNOSIS — R29818 Other symptoms and signs involving the nervous system: Secondary | ICD-10-CM | POA: Insufficient documentation

## 2023-01-22 DIAGNOSIS — R2681 Unsteadiness on feet: Secondary | ICD-10-CM | POA: Insufficient documentation

## 2023-01-22 DIAGNOSIS — R278 Other lack of coordination: Secondary | ICD-10-CM | POA: Insufficient documentation

## 2023-01-22 DIAGNOSIS — M6281 Muscle weakness (generalized): Secondary | ICD-10-CM | POA: Insufficient documentation

## 2023-01-22 DIAGNOSIS — G8222 Paraplegia, incomplete: Secondary | ICD-10-CM | POA: Insufficient documentation

## 2023-01-22 DIAGNOSIS — R208 Other disturbances of skin sensation: Secondary | ICD-10-CM | POA: Insufficient documentation

## 2023-01-22 NOTE — Telephone Encounter (Signed)
Called patient and patient informed therapist that he should have called yesterday to cancel appointment due to conflict. Therapist reminded of upcoming OT eval and patient confirmed that he would be there tentatively; will plan to reschedule PT eval when patient comes to OT eval.   Maryruth Eve, PT, DPT

## 2023-01-26 ENCOUNTER — Encounter: Payer: Self-pay | Admitting: Occupational Therapy

## 2023-01-26 ENCOUNTER — Ambulatory Visit: Payer: BC Managed Care – PPO | Admitting: Occupational Therapy

## 2023-01-26 ENCOUNTER — Other Ambulatory Visit: Payer: Self-pay

## 2023-01-26 DIAGNOSIS — R278 Other lack of coordination: Secondary | ICD-10-CM | POA: Diagnosis present

## 2023-01-26 DIAGNOSIS — M6281 Muscle weakness (generalized): Secondary | ICD-10-CM

## 2023-01-26 DIAGNOSIS — G8222 Paraplegia, incomplete: Secondary | ICD-10-CM

## 2023-01-26 DIAGNOSIS — R29898 Other symptoms and signs involving the musculoskeletal system: Secondary | ICD-10-CM

## 2023-01-26 DIAGNOSIS — R29818 Other symptoms and signs involving the nervous system: Secondary | ICD-10-CM | POA: Diagnosis present

## 2023-01-26 DIAGNOSIS — R2681 Unsteadiness on feet: Secondary | ICD-10-CM | POA: Diagnosis present

## 2023-01-26 DIAGNOSIS — R208 Other disturbances of skin sensation: Secondary | ICD-10-CM | POA: Diagnosis present

## 2023-01-26 DIAGNOSIS — R269 Unspecified abnormalities of gait and mobility: Secondary | ICD-10-CM | POA: Diagnosis present

## 2023-01-26 NOTE — Therapy (Unsigned)
OUTPATIENT OCCUPATIONAL THERAPY NEURO EVALUATION  Patient Name: Mario [name] MRN: [id number] DOB:01-Jul-1980, 42 y.o., male Today's Date: 01/26/2023  PCP: Clearnce Hasten, PA-C REFERRING PROVIDER: [name], PA-C  END OF SESSION:  OT End of Session - 01/26/23 0847     Visit Number 1    Number of Visits 12    Authorization Type BCBS: VL combined with pt, ot, st: 60    OT Start Time 0847    OT Stop Time 0931    OT Time Calculation (min) 44 min    Equipment Utilized During Treatment Testing material, bar at wall    Activity Tolerance Patient tolerated treatment well    Behavior During Therapy [facility] for tasks assessed/performed             Past Medical History:  Diagnosis Date   Bronchitis    Diabetes (HCC)    Diverticulitis 10/2015   Hypertension    Past Surgical History:  Procedure Laterality Date   LUMBAR LAMINECTOMY/DECOMPRESSION MICRODISCECTOMY N/A 12/16/2022   Procedure: THORACIC DECOMPRESSION;  Surgeon: Estill Bamberg, MD;  Location: [facility] OR;  Service: Orthopedics;  Laterality: N/A;   Patient Active Problem List   Diagnosis Date Noted   Spinal cord injury, thoracic region (HCC) 01/08/2023   Diabetes (HCC) 01/08/2023   Adjustment disorder with mixed anxiety and depressed mood 12/31/2022   Incomplete paraplegia (HCC) 12/29/2022   Thoracic myelopathy 12/15/2022    ONSET DATE: 01/09/2023  REFERRING DIAG: G82.22 (ICD-10-CM) - Paraplegia, incomplete  THERAPY DIAG:  Other lack of coordination  Muscle weakness (generalized)  Acute incomplete paraplegia (HCC)  Unsteadiness on feet  Other disturbances of skin sensation  Other symptoms and signs involving the musculoskeletal system  Other symptoms and signs involving the nervous system  Rationale for Evaluation and Treatment: Rehabilitation  SUBJECTIVE:   SUBJECTIVE STATEMENT: Pt had "Spinal decompression surgery" but reported he wasn't sure about what he needed to work on with OT.   Pt accompanied  by: self (dropped off by spouse)  PERTINENT HISTORY:  PMHx: T2DM, HTN, back pain who started developing balance issues, falls and numbness from waist down.  He was seen by MD with plans for surgery however started developing worsening of pain with increase in numbness and BLE weakness with difficulty walking and was admitted for workup.  Underwent emergent T7 to T10 laminectomy with bilateral partial facetectomy and spinal cord decompression on 08/27.  Postop had some improvement in weakness but continued to be limited by RLE greater than LLE weakness.   PRECAUTIONS: None and Other: Back brace (as needed)  WEIGHT BEARING RESTRICTIONS: Yes - pt reports 5-10 lbs lifting  PAIN:  Are you having pain? Not at rest but Yes: NPRS scale: 8/10 Pain location: B ankles (pain) ~ 1 year of issues Pain description: tingling, sharp pains Aggravating factors: bare feet Relieving factors: (Tylenol arthritis, Oxycodone - daily)  Last week he had a lot of trouble due to going Tuesday to Friday without pain med (Oxy).  FALLS: Has patient fallen in last 6 months? Yes. Number of falls 6-7 times: 3 times from bed in sleep, 1x at [place], in the bedroom ~ 2+ times with legs giving out, prior to surgery  LIVING ENVIRONMENT: Lives with: lives with their family, lives with their spouse, and lives with their sons (19 yo/15yo) Lives in: apartment/townhouse Stairs: Yes: Internal: 13 steps; on right going up Has following equipment at home: Quad cane large base, Walker - 2 wheeled, and bed side commode Pt has  generally been staying upstairs in his bedroom with family bringing him food etc.  This is only the 2nd time he has come down the stairs since coming home.  PLOF: Independent with basic ADLs - was eventually getting help with socks and shoes prior to emergent surgery as he was waiting for elective surgery and having more difficulty with ADLs. 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  OBJECTIVE:  Note: Objective measures were completed at Evaluation unless otherwise noted.  HAND DOMINANCE: Right  ADLs: Overall ADLs: Assistance from spouse with several aspects of ADLS (especially bathing and LB). Transfers/ambulation related to ADLs: SBA - using walker at home  Eating: Ind Grooming: Ind UB Dressing: Ind LB Dressing: Using grabbers a lot, wife is helping with Socks and shoes. Toileting: BSC s/p lack of pain meds last week  Bathing: Wife is doing it mostly.  He has stood at the sink a couple of times since returning home. Tub Shower transfers: NA (not performing) - only sponge bathing Equipment: bed side commode - in the bedroom with pt Sits on side of bed or in a chair throughout the day at home  IADLs: Shopping: Wife Light housekeeping: Wife Meal Prep: Wife Community mobility: Drs appts only at this time Medication management: Ind (medicine bottles) Financial management: Trying to help - has short term disability for job and wife had a stroke and is not back to work since 2023 Handwriting: No changes by reports  MOBILITY STATUS: Needs Assist: RW all the time  POSTURE COMMENTS:  rounded shoulders and forward head  ACTIVITY TOLERANCE: Activity tolerance: Fair  FUNCTIONAL OUTCOME MEASURES:  Modified Barthel Index of ADLS: 62/100    UPPER EXTREMITY ROM:    Active ROM Right eval Left eval  Shoulder flexion [facility]  Shoulder abduction    Shoulder adduction    Shoulder extension    Shoulder internal rotation    Shoulder external rotation    Elbow flexion    Elbow extension    Wrist flexion    Wrist extension    Wrist ulnar deviation    Wrist radial deviation    Wrist pronation    Wrist supination    (Blank rows = not tested)  UPPER EXTREMITY MMT:     MMT Right eval Left eval  Shoulder flexion 4 4  Shoulder abduction    Shoulder adduction    Shoulder extension    Shoulder internal rotation     Shoulder external rotation    Middle trapezius    Lower trapezius    Elbow flexion    Elbow extension    Wrist flexion    Wrist extension    Wrist ulnar deviation    Wrist radial deviation    Wrist pronation    Wrist supination    (Blank rows = not tested)  HAND FUNCTION: Grip strength: Right: 74.0, 76.7, 66.5 lbs; Left: 67.9, 59.0, 57.7  lbs Average: Right 72.4 lbs; Left 51.5 lbs  COORDINATION: Box and Blocks:  Right 33 blocks, Left 39 blocks  SENSATION: WFL - BUEs Across abdomen and thighs - tingling sensations ie) especially when the dog Economist) rubs on him - goes "0-1000" From OT hospital DC Central sensation comments: numbness/tingling in BLE, reports most ingling in bottom of feet/toes Light Touch Impaired Details: Impaired RLE; Impaired LLE Hot/Cold: Appears Intact Proprioception: Appears Intact Stereognosis: Not tested Coordination: Gross Motor Movements are Fluid and Coordinated: Yes Fine Motor Movements are  Fluid and Coordinated: Yes Coordination and Movement Description: BUE WFL, BLE impaired  EDEMA: na  MUSCLE TONE: WFL  COGNITION: Overall cognitive status: Within functional limits for tasks assessed BIMS at hospital DC last month - 15/15  VISION: Subjective report: No concerns/changes from baseline Baseline vision: Wears contacts Visual history: NA  VISION ASSESSMENT: Not tested  PERCEPTION: Not tested  PRAXIS: Not tested  OBSERVATIONS: Pt arrived in facility transport chair with ability to stand at parallel bars demonstrated in OT evaluation but pt remains holding on at all times for support and has limited activity tolerance in standing.  Pt. appears stronger than he performs and moves slowly when performing table top tasks.     TODAY'S TREATMENT:                                                                                                                              N/A   PATIENT EDUCATION: Education details: OT role and  considerations Person educated: Patient and Spouse Education method: Explanation Education comprehension: verbalized understanding and needs further education  HOME EXERCISE PROGRAM: TBD   GOALS: Goals reviewed with patient? Yes - will review specific goals at first treatment  SHORT TERM GOALS: Target date: 02/23/23  Patient will demonstrate updated B HEP with 25% verbal cues or less for proper execution. Baseline: New to outpt OT Goal status: INITIAL  2.  Patient will demonstrate at least 10 lbs improvement in B grip strength as needed to open jars and other containers. Baseline: Average: Right 72.4 lbs; Left 51.5 lbs Goal status: INITIAL  3.  Pt will be able to place at least an additional 5+ blocks using BUEs with completion of Box and Blocks test. Baseline: Right 33 blocks, Left 39 blocks Goal status: INITIAL  4.  Pt will be independent with toileting activities in the bathroom again. Baseline: Using BSC in bedroom. Goal status: INITIAL  5. Pt will be independent with LB dressing including socks and shoes.  Baseline: Wife is assisting  Goal Status: INITIAL  LONG TERM GOALS: Target date: 03/20/23  Patient will demonstrate updated B HEP with visual printouts only for proper execution. Baseline: New to outpt OT Goal status: INITIAL  2.  Patient will demonstrate improved independence with all ADL skills to score >80/100 on the Modified Barthel Index of ADLs. Baseline: Score 62 Goal status: INITIAL  3.  Patient will demonstrate standing balance/tolerance x 5+ minutes for simple meal prep/home management tasks to help at home. Baseline: Remaining upstairs only Goal status: INITIAL  ASSESSMENT:  CLINICAL IMPRESSION: Patient is a 42 y.o. male who was seen today for occupational therapy evaluation following emergent T7 to T10 laminectomy with bilateral partial facetectomy and spinal cord decompression on 08/27. Hx includes Diabetes, Adjustment disorder with mixed anxiety  and depressed mood, Incomplete paraplegia, Thoracic myelopathy. Patient currently presents below baseline level of functioning demonstrating functional deficits and impairments as noted below. Pt would benefit from skilled OT services in  the outpatient setting to work on impairments as noted below to help pt return to PLOF as able.    PERFORMANCE DEFICITS: in functional skills including ADLs, IADLs, coordination, dexterity, ROM, strength, pain, Fine motor control, Gross motor control, mobility, balance, endurance, decreased knowledge of use of DME, and UE functional use, cognitive skills including energy/drive, and psychosocial skills including coping strategies, environmental adaptation, and routines and behaviors.   IMPAIRMENTS: are limiting patient from ADLs, IADLs, work, leisure, and social participation.   CO-MORBIDITIES: may have co-morbidities  that affects occupational performance. Patient will benefit from skilled OT to address above impairments and improve overall function.  MODIFICATION OR ASSISTANCE TO COMPLETE EVALUATION: No modification of tasks or assist necessary to complete an evaluation.  OT OCCUPATIONAL PROFILE AND HISTORY: Problem focused assessment: Including review of records relating to presenting problem.  CLINICAL DECISION MAKING: LOW - limited treatment options, no task modification necessary  REHAB POTENTIAL: Excellent  EVALUATION COMPLEXITY: Low    PLAN:  OT FREQUENCY: 1-2x/week (will need more PT than OT)  OT DURATION: 6 weeks  PLANNED INTERVENTIONS: self care/ADL training, therapeutic exercise, therapeutic activity, neuromuscular re-education, balance training, functional mobility training, aquatic therapy, patient/family education, energy conservation, coping strategies training, and DME and/or AE instructions  RECOMMENDED OTHER SERVICES: PT evaluation rescheduled later this week  CONSULTED AND AGREED WITH PLAN OF CARE: Patient  PLAN FOR NEXT SESSION:   Review goals Begin UE HEP - putty (blue), theraband and even high level coordination Review ADL comp strategies Standing with UE use for ADLs   Victorino Sparrow, OT 01/26/2023, 10:47 AM

## 2023-01-29 ENCOUNTER — Other Ambulatory Visit: Payer: Self-pay

## 2023-01-29 ENCOUNTER — Encounter: Payer: Self-pay | Admitting: Physical Therapy

## 2023-01-29 ENCOUNTER — Ambulatory Visit: Payer: BC Managed Care – PPO | Admitting: Physical Therapy

## 2023-01-29 VITALS — BP 111/64 | HR 73

## 2023-01-29 DIAGNOSIS — R278 Other lack of coordination: Secondary | ICD-10-CM | POA: Diagnosis not present

## 2023-01-29 DIAGNOSIS — M6281 Muscle weakness (generalized): Secondary | ICD-10-CM

## 2023-01-29 DIAGNOSIS — R2681 Unsteadiness on feet: Secondary | ICD-10-CM

## 2023-01-29 DIAGNOSIS — R269 Unspecified abnormalities of gait and mobility: Secondary | ICD-10-CM

## 2023-01-29 NOTE — Therapy (Addendum)
OUTPATIENT PHYSICAL THERAPY NEURO EVALUATION   Patient Name: Mario [name] MRN: [id number] DOB:10/26/1980, 42 y.o., male Today's Date: 01/29/2023   PCP: Clearnce Hasten, PA-C REFERRING PROVIDER: [name], PA-C  END OF SESSION:  PT End of Session - 01/29/23 0853     Visit Number 1    Number of Visits 17   including eval   Date for PT Re-Evaluation 04/09/23    Authorization Type [employer] [place]    PT Start Time [phone]    PT Stop Time 0930    PT Time Calculation (min) 35 min    Equipment Utilized During Treatment Gait belt    Activity Tolerance Patient tolerated treatment well    Behavior During Therapy [facility] for tasks assessed/performed             Past Medical History:  Diagnosis Date   Bronchitis    Diabetes (HCC)    Diverticulitis 10/2015   Hypertension    Past Surgical History:  Procedure Laterality Date   LUMBAR LAMINECTOMY/DECOMPRESSION MICRODISCECTOMY N/A 12/16/2022   Procedure: THORACIC DECOMPRESSION;  Surgeon: Estill Bamberg, MD;  Location: [facility] OR;  Service: Orthopedics;  Laterality: N/A;   Patient Active Problem List   Diagnosis Date Noted   Spinal cord injury, thoracic region (HCC) 01/08/2023   Diabetes (HCC) 01/08/2023   Adjustment disorder with mixed anxiety and depressed mood 12/31/2022   Incomplete paraplegia (HCC) 12/29/2022   Thoracic myelopathy 12/15/2022    ONSET DATE: 01/09/2023 (referral)  REFERRING DIAG: G82.22 (ICD-10-CM) - Paraplegia, incomplete M47.14 (ICD-10-CM) - Other spondylosis with myelopathy, thoracic region  THERAPY DIAG:  Abnormality of gait and mobility - Plan: PT plan of care cert/re-cert  Unsteadiness on feet - Plan: PT plan of care cert/re-cert  Muscle weakness (generalized) - Plan: PT plan of care cert/re-cert  Rationale for Evaluation and Treatment: Rehabilitation  SUBJECTIVE:                                                                                                                                                                                              SUBJECTIVE STATEMENT: Patient reports that on 8/27 he had spinal cord decompression surgery. Patient reports that the surgery went well. Patient reports ongoing tingling and numbness in feet. Patient has not had any physical therapy since getting out of the hospital. Patient still has thoracic precautions (BLT) as far as he knows. Prior to surgery, patient was walking with a cane and then woke up one day and couldn't stand up. Since surgery, patient has been using a 2WW. Reports 6-7 x times prior to surgery (multiple due to rolling out of  bed, one falling at convenience store) and denies any falls since surgery. Patient reports that prior to surgery he was able to get up by himself. Prior to surgery, needed help with socks and shoes but otherwise independent; since surgery, patient required additional help with socks/shoes and bathing. Patient is not driving since surgery. Denies changes to bowel or bladder function.   Patient wears R foot up brace on RLE.  Pt accompanied by: self  PERTINENT HISTORY: thoracic myelopathy, T7 to T10 laminectomy with bilateral partial facetectomy and spinal cord decompression on 08/27  PAIN:  Are you having pain? Yes: NPRS scale: 7/10 Pain location: legs Pain description: brief spasms Aggravating factors: unsure just happen when I am sleeping Relieving factors: medication  PRECAUTIONS: Back and Fall  RED FLAGS: None   WEIGHT BEARING RESTRICTIONS: Yes cannot lift over 10 lbs  FALLS: Has patient fallen in last 6 months? Yes. 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  DIAGNOSTIC FINDINGS:  12/14/2022 MR THORACIC SPINE WO CONTRAST: IMPRESSION: 1. No spinal canal stenosis. 2. Mild neural foraminal narrowing on the right at L3-L4 and bilaterally at L4-L5. 3. Multilevel facet arthropathy, which is severe at L4-L5 and on the left at L5-S1, which can be a cause of back pain.  COGNITION: Overall cognitive status: Within functional limits for tasks assessed   SENSATION: Reports numbness at level of abdomen and bottom of feet  COORDINATION: WFL - unless spasm and then reduced coordination briefl  EDEMA:  Denies swelling  MUSCLE TONE: Hypertonicity of L and RLE   LOWER EXTREMITY ROM:     Active  Right Eval Left Eval  Hip flexion 60% 60%  Hip extension    Hip abduction    Hip adduction    Hip internal rotation    Hip external rotation    Knee flexion [facility] WFL  Knee extension [facility] WFL  Ankle dorsiflexion 60% 60%  Ankle plantarflexion    Ankle inversion    Ankle eversion     (Blank rows = not tested)  LOWER EXTREMITY MMT:    MMT Right Eval Left Eval  Hip flexion 3-/5 3-/5  Hip extension    Hip abduction    Hip adduction    Hip internal rotation    Hip external rotation    Knee flexion 4-/5 4-/5  Knee extension 3+/5 3+/5  Ankle dorsiflexion 4-/5 4-/5  Ankle plantarflexion    Ankle inversion    Ankle eversion    (Blank rows = not tested)  BED MOBILITY:  Reports being able to get up without any help  TRANSFERS: Assistive device utilized: Environmental consultant - 2 wheeled and foot up brace R LE   Sit to stand: SBA Stand to sit: SBA - poor eccentric control Chair to chair: SBA  GAIT: Gait pattern: step through pattern, decreased hip/knee flexion- Right, decreased hip/knee flexion- Left,  decreased ankle dorsiflexion- Right, decreased ankle dorsiflexion- Left, poor foot clearance- Right, and  poor foot clearance- Left (hyperextension for stability of bilateral knees) Distance walked: various clinic distances, ~60 feet Assistive device utilized: Walker - 2 wheeled and R foot up brace Level of assistance: SBA Comments: slow pace, difficulty with stability with transitions  FUNCTIONAL TESTS:    [facility] PT Assessment - 01/29/23 0001       Standardized Balance Assessment   Standardized Balance Assessment Five Times Sit to Stand;10 meter walk test;Timed Up and Go Test    Five times sit to stand comments  42.77   seconds with strong reliance on UE (SBA with walker in front)   10 Meter Walk 0.30   m/s with 2WW (SBA)            PATIENT SURVEYS:  Not performed   TODAY'S TREATMENT:                                                                                                                                PATIENT EDUCATION: Education details: POC, goal collaboration, examination findings Person educated: Patient Education method: Chief Technology Officer Education comprehension: verbalized understanding  HOME EXERCISE PROGRAM: To be provided  GOALS: Goals reviewed with patient? Yes  SHORT TERM GOALS: Target date: 02/26/2023  Patient will demonstrate independence with initial HEP to continue to progress between physical therapy sessions.   Baseline: To be provided Goal status: INITIAL  2.  Patient will improve gait speed to 0.4 m/s or greater with LRAD to indicate an improvement from being a household ambulator to limited community ambulator.   Baseline: 0.3 m/s with 2WW + R foot up brace (SBA) Goal status: INITIAL  3.  Patient will improve their 5x Sit to Stand score to less than 37 seconds to demonstrate a decreased risk for falls and improved LE strength.   Baseline: 42.77 seconds with UE use Goal status: INITIAL  4.  TUG to be assessed / STG written Baseline:  To be assessed Goal status: INITIAL  LONG TERM GOALS: Target date: 04/09/2023  Patient will report demonstrate independence with final HEP in order to maintain current gains and continue to progress after physical therapy discharge.   Baseline: To be provided Goal status: INITIAL  2.  Patient will improve gait speed to 0.5 m/s with LRAD to indicate improvement towards the level of community ambulator in order to participate more easily in activities outside of the home.   Baseline: 0.3 m/s with 2WW + R foot up brace (SBA) Goal status: INITIAL  3.  Patient will improve their 5x Sit to Stand score to less than 34 seconds to demonstrate a decreased risk for falls and improved LE strength.   Baseline: 42.77 seconds with UE use Goal status: INITIAL  4.  TUG to be assessed / LTG written Baseline: To be assessed Goal status: INITIAL  ASSESSMENT:  CLINICAL IMPRESSION: Patient is a 42 y.o. male who was seen today for physical therapy evaluation and treatment for impairments secondary to  thoracic myelopathy with T7 to T10 laminectomy with bilateral partial facetectomy and spinal cord decompression on 08/27. Patient has ongoing back precautions and lifting restrictions of 10lbs. Patient demonstrates increased risk for falls as indicated by gait speed and 5xSTS score. Patient also demonstrates strong reliance on UE for AD use and transfers at this time and functional LE strength deficits as indicated by 5xSTS score. Patient will benefit from skilled physical therapy services to help address strength, ROM, balance, pain, and gait deficits in order to progress towards LTGs.  OBJECTIVE IMPAIRMENTS: Abnormal gait, decreased activity tolerance, decreased balance, decreased endurance, decreased mobility, difficulty walking, decreased ROM, decreased strength, increased muscle spasms, impaired flexibility, impaired sensation, impaired UE functional use, and pain.   ACTIVITY LIMITATIONS: carrying, bending,  standing, transfers, reach over head, and locomotion level  PARTICIPATION LIMITATIONS: cleaning, laundry, community activity, occupation, and yard work  PERSONAL FACTORS: Past/current experiences, Time since onset of injury/illness/exacerbation, and 1-2 comorbidities: see above  are also affecting patient's functional outcome.   REHAB POTENTIAL: Good  CLINICAL DECISION MAKING: Evolving/moderate complexity  EVALUATION COMPLEXITY: Moderate  PLAN:  PT FREQUENCY: 2x/week (1x on land, 1x in pool as possible)  PT DURATION: 8 weeks  PLANNED INTERVENTIONS: 97146- PT Re-evaluation, 97110-Therapeutic exercises, 97530- Therapeutic activity, 97112- Neuromuscular re-education, 97535- Self Care, 16109- Manual therapy, [phone]- Gait training, [phone]- Aquatic Therapy, Patient/Family education, Dry Needling, and DME instructions  PLAN FOR NEXT SESSION: TUG to be assessed + goals updated, work on balance and strength HEP, AD trial if insurance will allow (patient foot up brace already paid by insurance when?), information regarding aquatic therapy   Carmelia Bake, PT, DPT 01/29/2023, 1:31 PM

## 2023-01-30 ENCOUNTER — Other Ambulatory Visit: Payer: Self-pay | Admitting: Physical Medicine and Rehabilitation

## 2023-02-05 ENCOUNTER — Ambulatory Visit: Payer: BC Managed Care – PPO | Admitting: Physical Therapy

## 2023-02-05 ENCOUNTER — Encounter: Payer: Self-pay | Admitting: Physical Therapy

## 2023-02-05 ENCOUNTER — Ambulatory Visit: Payer: BC Managed Care – PPO | Admitting: Occupational Therapy

## 2023-02-05 VITALS — BP 142/96 | HR 81

## 2023-02-05 DIAGNOSIS — R278 Other lack of coordination: Secondary | ICD-10-CM | POA: Diagnosis not present

## 2023-02-05 DIAGNOSIS — R208 Other disturbances of skin sensation: Secondary | ICD-10-CM

## 2023-02-05 DIAGNOSIS — R2681 Unsteadiness on feet: Secondary | ICD-10-CM

## 2023-02-05 DIAGNOSIS — G8222 Paraplegia, incomplete: Secondary | ICD-10-CM

## 2023-02-05 DIAGNOSIS — M6281 Muscle weakness (generalized): Secondary | ICD-10-CM

## 2023-02-05 DIAGNOSIS — R29818 Other symptoms and signs involving the nervous system: Secondary | ICD-10-CM

## 2023-02-05 DIAGNOSIS — R269 Unspecified abnormalities of gait and mobility: Secondary | ICD-10-CM

## 2023-02-05 DIAGNOSIS — R29898 Other symptoms and signs involving the musculoskeletal system: Secondary | ICD-10-CM

## 2023-02-05 NOTE — Therapy (Signed)
OUTPATIENT PHYSICAL THERAPY NEURO TREATMENT   Patient Name: Mario [name] MRN: [id number] DOB:1981/03/30, 42 y.o., male Today's Date: 02/05/2023   PCP: Clearnce Hasten, PA-C REFERRING PROVIDER: [name], PA-C  END OF SESSION:  PT End of Session - 02/05/23 0846     Visit Number 2    Number of Visits 17    Date for PT Re-Evaluation 04/09/23    Authorization Type [employer] Cross [place]    PT Start Time [phone]    PT Stop Time [phone]    PT Time Calculation (min) 41 min    Equipment Utilized During Treatment Gait belt    Activity Tolerance Patient tolerated treatment well    Behavior During Therapy [facility] for tasks assessed/performed             Past Medical History:  Diagnosis Date   Bronchitis    Diabetes (HCC)    Diverticulitis 10/2015   Hypertension    Past Surgical History:  Procedure Laterality Date   LUMBAR LAMINECTOMY/DECOMPRESSION MICRODISCECTOMY N/A 12/16/2022   Procedure: THORACIC DECOMPRESSION;  Surgeon: Estill Bamberg, MD;  Location: [facility] OR;  Service: Orthopedics;  Laterality: N/A;   Patient Active Problem List   Diagnosis Date Noted   Spinal cord injury, thoracic region (HCC) 01/08/2023   Diabetes (HCC) 01/08/2023   Adjustment disorder with mixed anxiety and depressed mood 12/31/2022   Incomplete paraplegia (HCC) 12/29/2022   Thoracic myelopathy 12/15/2022    ONSET DATE: 01/09/2023 (referral)  REFERRING DIAG: G82.22 (ICD-10-CM) - Paraplegia, incomplete M47.14 (ICD-10-CM) - Other spondylosis with myelopathy, thoracic region  THERAPY DIAG:  Unsteadiness on feet  Abnormality of gait and mobility  Rationale for Evaluation and Treatment: Rehabilitation  SUBJECTIVE:                                                                                                                                                                                             SUBJECTIVE STATEMENT: Patient reports that he has been tighter today; was not able to take muscle  relaxors. Patient is on tylenol arthritis. Denies falls/near falls. Patient not wearing foot up brace on RLE due to time rush.   Patient wears R foot up brace on RLE.  Pt accompanied by: self  PERTINENT HISTORY: thoracic myelopathy, T7 to T10 laminectomy with bilateral partial facetectomy and spinal cord decompression on 08/27  PAIN:  Are you having pain? Yes: NPRS scale: 7/10 Pain location: legs Pain description: brief spasms Aggravating factors: unsure just happen when I am sleeping Relieving factors: medication  PRECAUTIONS: Back and Fall  RED FLAGS: None   WEIGHT BEARING RESTRICTIONS: Yes cannot lift over 10  lbs  FALLS: Has patient fallen in last 6 months? Yes. 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  DIAGNOSTIC FINDINGS:  12/14/2022 MR THORACIC SPINE WO CONTRAST: IMPRESSION: 1. No spinal canal stenosis. 2. Mild neural foraminal narrowing on the right at L3-L4 and bilaterally at L4-L5. 3. Multilevel facet arthropathy, which is severe at L4-L5 and on the left at L5-S1, which can be a cause of back pain.  COGNITION: Overall cognitive status: Within functional limits for tasks assessed  TODAY'S TREATMENT:                                                                                                                               Vitals:   02/05/23 0854  BP: (!) 142/96  Pulse: 81  BP:  Taken on L arm  TherAct: Discussed logistics/expectations regarding aquatic therapy. Educated on how to get to the pool, what to wear, etc. Provided handout of information regarding what to expect and where to go for aquatics.    [facility] PT Assessment - 02/05/23 0001       Timed Up and Go Test   TUG Normal TUG    Normal TUG (seconds) 85   seconds with 2WW (CGA-SBA)           NMR:  Reviewed 1 x 10 reps of each exercise noted below for HEP.   Do each exercise 1-2  times per day Do each exercise 10 repetitions Hold each exercise for 2 seconds to feel your location  AT SINK FIND YOUR MIDLINE POSITION AND PLACE FEET EQUAL DISTANCE FROM THE MIDLINE.  Try to find this position when standing still for activities.   Side to Side Shift: Moving your hips only (not shoulders): move weight onto your left leg, HOLD/FEEL.  Move back to equal weight on each leg, HOLD/FEEL. Move weight onto your right leg, HOLD/FEEL. Move back to equal weight on each leg, HOLD/FEEL. Repeat.  Start with both hands on sink, progress to right hand only, then no hands.  Moving Cones / Cups: With equal weight on each leg: Hold on with one hand the first time, then progress to no hand supports. Move cups from one side of sink to the other. Place cups ~2" out of your reach, progress to 10" beyond reach.  Place one hand in middle of sink and reach with other hand. Do both arms.  Then hover  one hand and move cups with other hand.  Overhead/Upward Reaching: alternated reaching up to top cabinets or ceiling if no cabinets present. Keep equal weight on each leg. Start with one hand support on counter while other hand reaches and progress to no hand support with reaching.  ace one hand in middle of sink and  6.  Stepping:  Move items under cabinet out of your way. Step out with one foot and then bring it back; step again to side.  7. Marching at counter  GAIT: Gait pattern: step through pattern, decreased hip/knee flexion- Right,  decreased hip/knee flexion- Left, decreased ankle dorsiflexion- Right, decreased ankle dorsiflexion- Left, poor foot clearance- Right, and poor foot clearance- Left (hyperextension for stability of bilateral knees) Distance walked: various clinic distances, ~60 feet Assistive device utilized: Walker - 2 wheeled Level of assistance: 1 x 70 feet, 1 x 40 feet, 1 x 60 feet, 1 x 80 feet Comments: slow pace, difficulty with stability with transitions, reduced knee and ankle control that increased in severity with fatigue    PATIENT EDUCATION: Education details: Initial HEP + aquatic PT Person educated: Patient Education method: Chief Technology Officer Education comprehension: verbalized understanding  HOME EXERCISE PROGRAM:   Do each exercise 1-2  times per day Do each exercise 10 repetitions Hold each exercise for 2 seconds to feel your location  AT SINK FIND YOUR MIDLINE POSITION AND PLACE FEET EQUAL DISTANCE FROM THE MIDLINE.  Try to find this position when standing still for activities.   Side to Side Shift: Moving your hips only (not shoulders): move weight onto your left leg, HOLD/FEEL.  Move back to equal weight on each leg, HOLD/FEEL. Move weight onto your right leg, HOLD/FEEL. Move back to equal weight on each leg, HOLD/FEEL. Repeat.  Start with both hands on sink, progress to right hand only, then no hands.  Moving Cones / Cups: With equal weight on each leg: Hold on with one hand the first time, then progress to no hand supports. Move cups from one side of sink to the other. Place cups ~2" out of your reach, progress to 10" beyond reach.  Place one hand in middle of sink and reach with other hand. Do both arms.  Then hover one hand and move cups with other hand.  Overhead/Upward Reaching: alternated reaching up to top cabinets or ceiling if no cabinets present. Keep equal weight on each leg. Start with one hand support on counter while other hand reaches and progress to no hand support  with reaching.  ace one hand in middle of sink and  6.  Stepping:  Move items under cabinet out of your way. Step out with one foot and then bring it back; step again to side.  7. Marching at counter  GOALS: Goals reviewed with patient? Yes  SHORT TERM GOALS: Target date: 02/26/2023  Patient will demonstrate independence with initial HEP to continue to progress between physical therapy sessions.   Baseline: To be provided Goal status: INITIAL  2.  Patient will improve gait speed to 0.4 m/s or greater with LRAD to indicate an improvement from being a household ambulator to limited community ambulator.   Baseline: 0.3 m/s with 2WW + R foot up brace (SBA) Goal status: INITIAL  3.  Patient will improve their 5x Sit to Stand score to less than 37 seconds to demonstrate a decreased risk for falls and improved LE strength.   Baseline: 42.77 seconds with UE use Goal status: INITIAL  4.  Patient will improve TUG score to 75 seconds with LRAD to indicate a decreased risk of falls and demonstrate improved overall mobility.    Baseline: 85 seconds with 2WW Goal status: INITIAL  LONG TERM GOALS: Target date: 04/09/2023  Patient will report demonstrate independence with final HEP in order to maintain current gains and continue to progress after physical therapy discharge.   Baseline: To be provided Goal status: INITIAL  2.  Patient will improve gait speed to 0.5 m/s with LRAD to indicate improvement towards the level of community ambulator in order to participate more easily in activities outside of the home.   Baseline: 0.3 m/s with 2WW + R foot up brace (SBA) Goal status: INITIAL  3.  Patient will improve their 5x Sit to Stand score to less than 34 seconds to demonstrate a decreased risk for falls and improved LE strength.   Baseline: 42.77 seconds with UE use Goal status: INITIAL  4.  Patient will improve TUG score to 60 seconds or less with LRAD to indicate a decreased risk of falls and  demonstrate improved overall mobility.   Baseline: 85 seconds with 2WW Goal status: INITIAL  ASSESSMENT:  CLINICAL IMPRESSION: Skilled PT session emphasized creation of initial HEP and assessment of TUG. Patient demonstrates high risk for falls as indicated by TUG score > 80 seconds with 2WW. Patient limited by fatigue and SOB during session appears due to deconditioning. Patient would benefit from custom AFO likely to improve ankle stability. Continue POC as able.   OBJECTIVE IMPAIRMENTS: Abnormal gait, decreased activity tolerance, decreased balance, decreased endurance, decreased mobility, difficulty walking, decreased ROM, decreased strength, increased muscle spasms, impaired flexibility, impaired sensation, impaired UE functional use, and pain.   ACTIVITY LIMITATIONS: carrying, bending, standing, transfers, reach over head, and locomotion level  PARTICIPATION LIMITATIONS: cleaning, laundry, community activity, occupation, and yard work  PERSONAL FACTORS: Past/current experiences, Time since onset of injury/illness/exacerbation, and 1-2 comorbidities: see above  are also affecting patient's functional outcome.   REHAB POTENTIAL: Good  CLINICAL DECISION MAKING: Evolving/moderate complexity  EVALUATION COMPLEXITY: Moderate  PLAN:  PT FREQUENCY: 2x/week (1x on land, 1x in pool as possible)  PT DURATION: 8 weeks  PLANNED INTERVENTIONS: 97146- PT Re-evaluation, 97110-Therapeutic exercises, 97530- Therapeutic activity, 97112- Neuromuscular re-education, 97535- Self Care, 16109- Manual therapy, [phone]- Gait training, [phone]- Aquatic Therapy, Patient/Family education, Dry Needling, and DME instructions  PLAN FOR NEXT SESSION: work on balance and strength, AD trial if insurance will allow (patient foot up brace already paid by insurance when?), work on flexion based activiites to minimize tone with SciFit warmup?  Carmelia Bake, PT, DPT 02/05/2023, 9:37 AM

## 2023-02-05 NOTE — Therapy (Signed)
OUTPATIENT OCCUPATIONAL THERAPY NEURO TREATMENT  Patient Name: Mario [name] MRN: [id number] DOB:Nov 19, 1980, 42 y.o., male Today's Date: 02/05/2023  PCP: Clearnce Hasten, PA-C REFERRING PROVIDER: [name], PA-C  END OF SESSION:  OT End of Session - 02/05/23 0823     Visit Number 2    Number of Visits 12    Date for OT Re-Evaluation 03/20/23    Authorization Type BCBS: VL combined with pt, ot, st: 60    OT Start Time 0804    OT Stop Time 0842    OT Time Calculation (min) 38 min    Equipment Utilized During Treatment theraputty    Activity Tolerance Patient tolerated treatment well    Behavior During Therapy [facility] for tasks assessed/performed              Past Medical History:  Diagnosis Date   Bronchitis    Diabetes (HCC)    Diverticulitis 10/2015   Hypertension    Past Surgical History:  Procedure Laterality Date   LUMBAR LAMINECTOMY/DECOMPRESSION MICRODISCECTOMY N/A 12/16/2022   Procedure: THORACIC DECOMPRESSION;  Surgeon: Estill Bamberg, MD;  Location: [facility] OR;  Service: Orthopedics;  Laterality: N/A;   Patient Active Problem List   Diagnosis Date Noted   Spinal cord injury, thoracic region (HCC) 01/08/2023   Diabetes (HCC) 01/08/2023   Adjustment disorder with mixed anxiety and depressed mood 12/31/2022   Incomplete paraplegia (HCC) 12/29/2022   Thoracic myelopathy 12/15/2022    ONSET DATE: 01/09/2023  REFERRING DIAG: G82.22 (ICD-10-CM) - Paraplegia, incomplete  THERAPY DIAG:  Other lack of coordination  Muscle weakness (generalized)  Acute incomplete paraplegia (HCC)  Unsteadiness on feet  Other symptoms and signs involving the musculoskeletal system  Other symptoms and signs involving the nervous system  Other disturbances of skin sensation  Rationale for Evaluation and Treatment: Rehabilitation  SUBJECTIVE:   SUBJECTIVE STATEMENT: Pt reports no recent falls. Pt reports taking tylenol to manage pain and  no longer taking oxycodone to  manage pain. However, pt reported forgetting to take pain medication today. Pt reports using "grabber" at home to pick up objects off floor.   Pt accompanied by: self (dropped off by spouse)  PERTINENT HISTORY:  PMHx: T2DM, HTN, back pain who started developing balance issues, falls and numbness from waist down.  He was seen by MD with plans for surgery however started developing worsening of pain with increase in numbness and BLE weakness with difficulty walking and was admitted for workup.  Underwent emergent T7 to T10 laminectomy with bilateral partial facetectomy and spinal cord decompression on 08/27.  Postop had some improvement in weakness but continued to be limited by RLE greater than LLE weakness.   PRECAUTIONS: None and Other: Back brace (as needed)  WEIGHT BEARING RESTRICTIONS: Yes - pt reports 5-10 lbs lifting  PAIN:  Are you having pain? Not at rest but Yes: NPRS scale: 6/10 Pain location: R ankle (pain)  Pain description: aching Aggravating factors: older shoes "only ones I can get on" Relieving factors: (Tylenol)  FALLS: Has patient fallen in last 6 months? Yes. Number of falls 6-7 times: 3 times from bed in sleep, 1x at [place], in the bedroom ~ 2+ times with legs giving out, prior to surgery  LIVING ENVIRONMENT: Lives with: lives with their family, lives with their spouse, and lives with their sons (19 yo/15yo) Lives in: apartment/townhouse Stairs: Yes: Internal: 13 steps; on right going up Has following equipment at home: Chiropractor, Environmental consultant - 2 wheeled, and  bed side commode Pt has generally been staying upstairs in his bedroom with family bringing him food etc.  This is only the 2nd time he has come down the stairs since coming home.  PLOF: Independent with basic ADLs - was eventually getting help with socks and shoes prior to emergent surgery as he was waiting for elective surgery and having more difficulty with ADLs. 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  OBJECTIVE:  Note: Objective measures were completed at Evaluation unless otherwise noted.  HAND DOMINANCE: Right  ADLs: Overall ADLs: Assistance from spouse with several aspects of ADLS (especially bathing and LB). Transfers/ambulation related to ADLs: SBA - using walker at home  Eating: Ind Grooming: Ind UB Dressing: Ind LB Dressing: Using grabbers a lot, wife is helping with Socks and shoes. Toileting: BSC s/p lack of pain meds last week  Bathing: Wife is doing it mostly.  He has stood at the sink a couple of times since returning home. Tub Shower transfers: NA (not performing) - only sponge bathing Equipment: bed side commode - in the bedroom with pt Sits on side of bed or in a chair throughout the day at home  IADLs: Shopping: Wife Light housekeeping: Wife Meal Prep: Wife Community mobility: Drs appts only at this time Medication management: Ind (medicine bottles) Financial management: Trying to help - has short term disability for job and wife had a stroke and is not back to work since 2023 Handwriting: No changes by reports  MOBILITY STATUS: Needs Assist: RW all the time  POSTURE COMMENTS:  rounded shoulders and forward head  ACTIVITY TOLERANCE: Activity tolerance: Fair  FUNCTIONAL OUTCOME MEASURES:  Modified Barthel Index of ADLS: 62/100    UPPER EXTREMITY ROM:    Active ROM Right eval Left eval  Shoulder flexion [facility]  Shoulder abduction    Shoulder adduction    Shoulder extension    Shoulder internal rotation    Shoulder external rotation    Elbow flexion    Elbow extension    Wrist flexion    Wrist extension    Wrist ulnar deviation    Wrist radial deviation    Wrist pronation    Wrist supination    (Blank rows = not tested)  UPPER EXTREMITY MMT:     MMT Right eval Left eval  Shoulder flexion 4 4  Shoulder abduction    Shoulder adduction    Shoulder extension     Shoulder internal rotation    Shoulder external rotation    Middle trapezius    Lower trapezius    Elbow flexion    Elbow extension    Wrist flexion    Wrist extension    Wrist ulnar deviation    Wrist radial deviation    Wrist pronation    Wrist supination    (Blank rows = not tested)  HAND FUNCTION: Grip strength: Right: 74.0, 76.7, 66.5 lbs; Left: 67.9, 59.0, 57.7  lbs Average: Right 72.4 lbs; Left 51.5 lbs  COORDINATION: Box and Blocks:  Right 33 blocks, Left 39 blocks  SENSATION: WFL - BUEs Across abdomen and thighs - tingling sensations ie) especially when the dog Economist) rubs on him - goes "0-1000" From OT hospital DC [facility] sensation comments: numbness/tingling in BLE, reports most ingling in bottom of feet/toes Light Touch Impaired Details: Impaired RLE; Impaired LLE Hot/Cold: Appears Intact Proprioception: Appears Intact Stereognosis: Not tested Coordination: Gross Motor Movements are Fluid and Coordinated:  Yes Fine Motor Movements are Fluid and Coordinated: Yes Coordination and Movement Description: BUE WFL, BLE impaired  EDEMA: na  MUSCLE TONE: WFL  COGNITION: Overall cognitive status: Within functional limits for tasks assessed BIMS at hospital DC last month - 15/15  VISION: Subjective report: No concerns/changes from baseline Baseline vision: Wears contacts Visual history: NA  VISION ASSESSMENT: Not tested  PERCEPTION: Not tested  PRAXIS: Not tested  OBSERVATIONS: Pt arrived in facility transport chair with ability to stand at parallel bars demonstrated in OT evaluation but pt remains holding on at all times for support and has limited activity tolerance in standing.  Pt. appears stronger than he performs and moves slowly when performing table top tasks.     TODAY'S TREATMENT:                                                                                                                               Therapeutic Activities:  OT  reviewed specific OT goals with pt, pt acknowledged understanding of and agreed with goals.   OT educated pt regarding theraputty purpose, keeping theraputty away from fabric, putting theraputty away after each use, and avoiding leaving theraputty in hot environments. Pt verbalized understanding. Initiated theraputty HEP with green theraputty to improve BUE grip strengthening and FM coordination.  Exercises (BUE) - Putty Squeezes  - 10 reps - Hook Fist with Putty  - 10 reps - Tip Pinch with Putty  - 10 reps - Finger Pinch and Pull with Putty   10 reps - Seated Three Finger Pinch with Putty  10 reps - Finger Key Grip with Putty  10 reps - Seated Finger Extension with Putty  10 reps  Pt c/o pain at L side and OT noted pt leaning in chair. OT educated pt regarding upright seated posture to decrease pain, improve proprioception, improve upright seated and standing posture for ADL/IADL tasks. Pt verbalized understanding though benefited from v/c to maintain upright posture.  Self-care:  OT educated pt regarding strategies for donning socks and shoes, such as crossing legs or using a step stool. Pt verbalized understanding and reported preference to cross legs to don socks/shoes.  OT educated pt about using a basket or bag on walker to assist with carrying items while ambulating. Pt verbalized understanding.  PATIENT EDUCATION: Education details: See today's treatment above Person educated: Patient Education method: Explanation, Demonstration, and Handouts Education comprehension: verbalized understanding, returned demonstration, and needs further education  HOME EXERCISE PROGRAM: 02/05/23 - theraputty (green) HEP - access code ([email or web address])   GOALS: Goals reviewed with patient? Yes 02/05/23  SHORT TERM GOALS: Target date: 02/23/23  Patient will demonstrate updated B HEP with 25% verbal cues or less for proper execution. Baseline: New to outpt OT Goal status: INITIAL  2.  Patient will  demonstrate at least 10 lbs improvement in B grip strength as needed to open jars and other containers. Baseline: Average: Right 72.4 lbs; Left 51.5 lbs Goal status:  INITIAL  3.  Pt will be able to place at least an additional 5+ blocks using BUEs with completion of Box and Blocks test. Baseline: Right 33 blocks, Left 39 blocks Goal status: INITIAL  4.  Pt will be independent with toileting activities in the bathroom again. Baseline: Using BSC in bedroom. Goal status: INITIAL  5. Pt will be independent with LB dressing including socks and shoes.  Baseline: Wife is assisting  Goal Status: INITIAL  LONG TERM GOALS: Target date: 03/20/23  Patient will demonstrate updated B HEP with visual printouts only for proper execution. Baseline: New to outpt OT Goal status: INITIAL  2.  Patient will demonstrate improved independence with all ADL skills to score >80/100 on the Modified Barthel Index of ADLs. Baseline: Score 62 Goal status: INITIAL  3.  Patient will demonstrate standing balance/tolerance x 5+ minutes for simple meal prep/home management tasks to help at home. Baseline: Remaining upstairs only Goal status: INITIAL  ASSESSMENT:  CLINICAL IMPRESSION: Pt tolerated treatment well though demo'd increased fatigue by end of session. Pt reported some pain when leaning to side and then reported decreased pain when focusing on upright seated posture. Continuing education recommended for upright seated/standing posture.  Pt would continue to benefit from skilled OT services in the outpatient setting to work on impairments as noted below to help pt return to PLOF as able.    PERFORMANCE DEFICITS: in functional skills including ADLs, IADLs, coordination, dexterity, ROM, strength, pain, Fine motor control, Gross motor control, mobility, balance, endurance, decreased knowledge of use of DME, and UE functional use, cognitive skills including energy/drive, and psychosocial skills including coping  strategies, environmental adaptation, and routines and behaviors.   IMPAIRMENTS: are limiting patient from ADLs, IADLs, work, leisure, and social participation.   CO-MORBIDITIES: may have co-morbidities  that affects occupational performance. Patient will benefit from skilled OT to address above impairments and improve overall function.  MODIFICATION OR ASSISTANCE TO COMPLETE EVALUATION: No modification of tasks or assist necessary to complete an evaluation.  OT OCCUPATIONAL PROFILE AND HISTORY: Problem focused assessment: Including review of records relating to presenting problem.  CLINICAL DECISION MAKING: LOW - limited treatment options, no task modification necessary  REHAB POTENTIAL: Excellent  EVALUATION COMPLEXITY: Low    PLAN:  OT FREQUENCY: 1-2x/week (will need more PT than OT)  OT DURATION: 6 weeks  PLANNED INTERVENTIONS: self care/ADL training, therapeutic exercise, therapeutic activity, neuromuscular re-education, balance training, functional mobility training, aquatic therapy, patient/family education, energy conservation, coping strategies training, and DME and/or AE instructions  RECOMMENDED OTHER SERVICES: PT evaluation rescheduled later this week  CONSULTED AND AGREED WITH PLAN OF CARE: Patient  PLAN FOR NEXT SESSION:  Continuing education and v/c for upright standing/seated posture Incorporate standing with UE use during session for ADLs  Progress UE HEP - putty (progress to blue PRN), theraband and even high level coordination Review ADL comp strategies  Wynetta Emery, OT 02/05/2023, 8:52 AM

## 2023-02-06 ENCOUNTER — Encounter: Payer: Self-pay | Admitting: Physical Therapy

## 2023-02-06 ENCOUNTER — Ambulatory Visit: Payer: BC Managed Care – PPO | Admitting: Occupational Therapy

## 2023-02-06 ENCOUNTER — Ambulatory Visit: Payer: BC Managed Care – PPO | Admitting: Physical Therapy

## 2023-02-06 ENCOUNTER — Encounter
Payer: BC Managed Care – PPO | Attending: Physical Medicine and Rehabilitation | Admitting: Physical Medicine and Rehabilitation

## 2023-02-06 VITALS — BP 120/77 | HR 74

## 2023-02-06 DIAGNOSIS — R269 Unspecified abnormalities of gait and mobility: Secondary | ICD-10-CM | POA: Insufficient documentation

## 2023-02-06 DIAGNOSIS — R278 Other lack of coordination: Secondary | ICD-10-CM

## 2023-02-06 DIAGNOSIS — M6281 Muscle weakness (generalized): Secondary | ICD-10-CM

## 2023-02-06 DIAGNOSIS — R252 Cramp and spasm: Secondary | ICD-10-CM | POA: Insufficient documentation

## 2023-02-06 DIAGNOSIS — R29818 Other symptoms and signs involving the nervous system: Secondary | ICD-10-CM

## 2023-02-06 DIAGNOSIS — R208 Other disturbances of skin sensation: Secondary | ICD-10-CM

## 2023-02-06 DIAGNOSIS — G8222 Paraplegia, incomplete: Secondary | ICD-10-CM | POA: Insufficient documentation

## 2023-02-06 DIAGNOSIS — R2681 Unsteadiness on feet: Secondary | ICD-10-CM

## 2023-02-06 DIAGNOSIS — R29898 Other symptoms and signs involving the musculoskeletal system: Secondary | ICD-10-CM

## 2023-02-06 NOTE — Therapy (Signed)
OUTPATIENT OCCUPATIONAL THERAPY NEURO TREATMENT  Patient Name: Burt Narez MRN: [id number] DOB:13-Mar-1981, 42 y.o., male Today's Date: 02/06/2023  PCP: Clearnce Hasten, PA-C REFERRING PROVIDER: [name], PA-C  END OF SESSION:  OT End of Session - 02/06/23 1008     Visit Number 3    Number of Visits 12    Date for OT Re-Evaluation 03/20/23    Authorization Type BCBS: VL combined with pt, ot, st: 60    OT Start Time 0807    OT Stop Time 0846    OT Time Calculation (min) 39 min    Equipment Utilized During Treatment theraband, gait belt    Activity Tolerance Patient tolerated treatment well    Behavior During Therapy [facility] for tasks assessed/performed               Past Medical History:  Diagnosis Date   Bronchitis    Diabetes (HCC)    Diverticulitis 10/2015   Hypertension    Past Surgical History:  Procedure Laterality Date   LUMBAR LAMINECTOMY/DECOMPRESSION MICRODISCECTOMY N/A 12/16/2022   Procedure: THORACIC DECOMPRESSION;  Surgeon: Estill Bamberg, MD;  Location: [facility] OR;  Service: Orthopedics;  Laterality: N/A;   Patient Active Problem List   Diagnosis Date Noted   Spinal cord injury, thoracic region (HCC) 01/08/2023   Diabetes (HCC) 01/08/2023   Adjustment disorder with mixed anxiety and depressed mood 12/31/2022   Incomplete paraplegia (HCC) 12/29/2022   Thoracic myelopathy 12/15/2022    ONSET DATE: 01/09/2023  REFERRING DIAG: G82.22 (ICD-10-CM) - Paraplegia, incomplete  THERAPY DIAG:  Other lack of coordination  Muscle weakness (generalized)  Unsteadiness on feet  Other symptoms and signs involving the musculoskeletal system  Other symptoms and signs involving the nervous system  Other disturbances of skin sensation  Acute incomplete paraplegia (HCC)  Rationale for Evaluation and Treatment: Rehabilitation  SUBJECTIVE:   SUBJECTIVE STATEMENT: Pt reports no recent falls. Pt reports taking hydrocodone now to manage pain. Pt reports  completing theraputty HEP and taking pain medication this A.M.   Pt accompanied by: self, spouse Laurey Arrow)  PERTINENT HISTORY:  PMHx: T2DM, HTN, back pain who started developing balance issues, falls and numbness from waist down.  He was seen by MD with plans for surgery however started developing worsening of pain with increase in numbness and BLE weakness with difficulty walking and was admitted for workup.  Underwent emergent T7 to T10 laminectomy with bilateral partial facetectomy and spinal cord decompression on 08/27.  Postop had some improvement in weakness but continued to be limited by RLE greater than LLE weakness.   PRECAUTIONS: None and Other: Back brace (as needed)  WEIGHT BEARING RESTRICTIONS: Yes - pt reports 5-10 lbs lifting  PAIN:  Are you having pain?  Yes: NPRS scale: 6/10 Pain location: L back Pain description: sharp, depends on positioning Aggravating factors: leaning to the R side Relieving factors: pain medication  FALLS: Has patient fallen in last 6 months? Yes. Number of falls 6-7 times: 3 times from bed in sleep, 1x at [place], in the bedroom ~ 2+ times with legs giving out, prior to surgery  LIVING ENVIRONMENT: Lives with: lives with their family, lives with their spouse, and lives with their sons (19 yo/15yo) Lives in: apartment/townhouse Stairs: Yes: Internal: 13 steps; on right going up Has following equipment at home: Quad cane large base, Walker - 2 wheeled, and bed side commode Pt has generally been staying upstairs in his bedroom with family bringing him food etc.  This is  only the 2nd time he has come down the stairs since coming home.  PLOF: Independent with basic ADLs - was eventually getting help with socks and shoes prior to emergent surgery as he was waiting for elective surgery and having more difficulty with ADLs. 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  OBJECTIVE:  Note: Objective  measures were completed at Evaluation unless otherwise noted.  HAND DOMINANCE: Right  ADLs: Overall ADLs: Assistance from spouse with several aspects of ADLS (especially bathing and LB). Transfers/ambulation related to ADLs: SBA - using walker at home  Eating: Ind Grooming: Ind UB Dressing: Ind LB Dressing: Using grabbers a lot, wife is helping with Socks and shoes. Toileting: BSC s/p lack of pain meds last week  Bathing: Wife is doing it mostly.  He has stood at the sink a couple of times since returning home. Tub Shower transfers: NA (not performing) - only sponge bathing Equipment: bed side commode - in the bedroom with pt Sits on side of bed or in a chair throughout the day at home  IADLs: Shopping: Wife Light housekeeping: Wife Meal Prep: Wife Community mobility: Drs appts only at this time Medication management: Ind (medicine bottles) Financial management: Trying to help - has short term disability for job and wife had a stroke and is not back to work since 2023 Handwriting: No changes by reports  MOBILITY STATUS: Needs Assist: RW all the time  POSTURE COMMENTS:  rounded shoulders and forward head  ACTIVITY TOLERANCE: Activity tolerance: Fair  FUNCTIONAL OUTCOME MEASURES:  Modified Barthel Index of ADLS: 62/100    UPPER EXTREMITY ROM:    Active ROM Right eval Left eval  Shoulder flexion [facility]  Shoulder abduction    Shoulder adduction    Shoulder extension    Shoulder internal rotation    Shoulder external rotation    Elbow flexion    Elbow extension    Wrist flexion    Wrist extension    Wrist ulnar deviation    Wrist radial deviation    Wrist pronation    Wrist supination    (Blank rows = not tested)  UPPER EXTREMITY MMT:     MMT Right eval Left eval  Shoulder flexion 4 4  Shoulder abduction    Shoulder adduction    Shoulder extension    Shoulder internal rotation    Shoulder external rotation    Middle trapezius    Lower trapezius     Elbow flexion    Elbow extension    Wrist flexion    Wrist extension    Wrist ulnar deviation    Wrist radial deviation    Wrist pronation    Wrist supination    (Blank rows = not tested)  HAND FUNCTION: Grip strength: Right: 74.0, 76.7, 66.5 lbs; Left: 67.9, 59.0, 57.7  lbs Average: Right 72.4 lbs; Left 51.5 lbs  COORDINATION: Box and Blocks:  Right 33 blocks, Left 39 blocks  SENSATION: WFL - BUEs Across abdomen and thighs - tingling sensations ie) especially when the dog Economist) rubs on him - goes "0-1000" From OT hospital DC Central sensation comments: numbness/tingling in BLE, reports most ingling in bottom of feet/toes Light Touch Impaired Details: Impaired RLE; Impaired LLE Hot/Cold: Appears Intact Proprioception: Appears Intact Stereognosis: Not tested Coordination: Gross Motor Movements are Fluid and Coordinated: Yes Fine Motor Movements are Fluid and Coordinated: Yes Coordination and Movement Description: BUE WFL, BLE impaired  EDEMA: na  MUSCLE TONE: WFL  COGNITION: Overall cognitive status: Within functional limits for tasks assessed BIMS at hospital DC last month - 15/15  VISION: Subjective report: No concerns/changes from baseline Baseline vision: Wears contacts Visual history: NA  VISION ASSESSMENT: Not tested  PERCEPTION: Not tested  PRAXIS: Not tested  OBSERVATIONS: Pt arrived in facility transport chair with ability to stand at parallel bars demonstrated in OT evaluation but pt remains holding on at all times for support and has limited activity tolerance in standing.  Pt. appears stronger than he performs and moves slowly when performing table top tasks.     TODAY'S TREATMENT:                                                                                                                               Therapeutic Exercises:  OT initiated black theraband HEP to improve grip strength, BUE strengthening, core stability, upright seated  posture. Handout provided.   Exercises (black theraband) BUE: - Seated Scapular Retraction  - 15 reps - Standing Single Arm Elbow Flexion with Resistance  - 15 reps - Standing Elbow Extension with Self-Anchored Resistance  - 15 reps - Shoulder External Rotation and Scapular Retraction with Resistance   - 15 reps - Standing Shoulder Horizontal Abduction with Resistance  -  15 reps - Seated Shoulder Flexion with Self-Anchored Resistance  -- 15 reps - Shoulder extension with resistance - Neutral  - 15 reps   Pt benefited from v/c for upright seated posture, positioning during exercises.   Self-Care OT noted pt demo'd rounded shoulders as needed. OT educated pt on purpose of HEP exercises including scapular retraction to improve posture, proper upright seated/standing posture to reduce pain, positioning during exercises, joint protection. Pt and pt's spouse verbalized understanding.  Wiping countertops, crossing midline, while standing with gait belt - to improve BUE strengthening, standing tolerance, dynamic balance, gross motor movements during ADL/IADL tasks. - timed 4 minutes  PATIENT EDUCATION: Education details: See today's treatment above Person educated: Patient and Spouse Education method: Explanation, Demonstration, and Handouts Education comprehension: verbalized understanding, returned demonstration, and needs further education  HOME EXERCISE PROGRAM: 02/05/23 - theraputty (green) HEP - access code ([email or web address]) 02/06/23 - theraband (black) HEP - BUE shoulder - access code [facility])   GOALS: Goals reviewed with patient? Yes 02/05/23  SHORT TERM GOALS: Target date: 02/23/23  Patient will demonstrate updated B HEP with 25% verbal cues or less for proper execution. Baseline: New to outpt OT Goal status: INITIAL  2.  Patient will demonstrate at least 10 lbs improvement in B grip strength as needed to open jars and other containers. Baseline: Average: Right 72.4 lbs; Left 51.5  lbs Goal status: INITIAL  3.  Pt will be able to place at least an additional 5+ blocks using BUEs with completion of Box and Blocks test. Baseline: Right 33 blocks, Left 39 blocks Goal status: INITIAL  4.  Pt will be independent with toileting activities in  the bathroom again. Baseline: Using BSC in bedroom. Goal status: INITIAL  5. Pt will be independent with LB dressing including socks and shoes.  Baseline: Wife is assisting  Goal Status: INITIAL  LONG TERM GOALS: Target date: 03/20/23  Patient will demonstrate updated B HEP with visual printouts only for proper execution. Baseline: New to outpt OT Goal status: INITIAL  2.  Patient will demonstrate improved independence with all ADL skills to score >80/100 on the Modified Barthel Index of ADLs. Baseline: Score 62 Goal status: INITIAL  3.  Patient will demonstrate standing balance/tolerance x 5+ minutes for simple meal prep/home management tasks to help at home. Baseline: Remaining upstairs only Goal status: INITIAL  ASSESSMENT:  CLINICAL IMPRESSION: Pt tolerated treatment well though continues to benefit from v/c to improve upright seated and standing posture. Pt would continue to benefit from skilled OT services in the outpatient setting to work on impairments as noted below to help pt return to PLOF as able.    PERFORMANCE DEFICITS: in functional skills including ADLs, IADLs, coordination, dexterity, ROM, strength, pain, Fine motor control, Gross motor control, mobility, balance, endurance, decreased knowledge of use of DME, and UE functional use, cognitive skills including energy/drive, and psychosocial skills including coping strategies, environmental adaptation, and routines and behaviors.   IMPAIRMENTS: are limiting patient from ADLs, IADLs, work, leisure, and social participation.   CO-MORBIDITIES: may have co-morbidities  that affects occupational performance. Patient will benefit from skilled OT to address above  impairments and improve overall function.  MODIFICATION OR ASSISTANCE TO COMPLETE EVALUATION: No modification of tasks or assist necessary to complete an evaluation.  OT OCCUPATIONAL PROFILE AND HISTORY: Problem focused assessment: Including review of records relating to presenting problem.  CLINICAL DECISION MAKING: LOW - limited treatment options, no task modification necessary  REHAB POTENTIAL: Excellent  EVALUATION COMPLEXITY: Low    PLAN:  OT FREQUENCY: 1-2x/week (will need more PT than OT)  OT DURATION: 6 weeks  PLANNED INTERVENTIONS: self care/ADL training, therapeutic exercise, therapeutic activity, neuromuscular re-education, balance training, functional mobility training, aquatic therapy, patient/family education, energy conservation, coping strategies training, and DME and/or AE instructions  RECOMMENDED OTHER SERVICES: PT evaluation rescheduled later this week  CONSULTED AND AGREED WITH PLAN OF CARE: Patient  PLAN FOR NEXT SESSION:  Continuing education and v/c for upright standing/seated posture Incorporate standing with UE use during session for ADLs Review strategies for ADL bathing, grooming, dressing, toileting tasks to increase ind with A/E PRN - ask pt about limiting factors  Progress UE HEP - putty (progress to blue PRN), theraband (black) and even high level coordination Review ADL comp strategies  Wynetta Emery, OT 02/06/2023, 10:22 AM

## 2023-02-06 NOTE — Therapy (Signed)
OUTPATIENT PHYSICAL THERAPY NEURO TREATMENT   Patient Name: Mario [name] MRN: [id number] DOB:1981-01-22, 42 y.o., male Today's Date: 02/06/2023   PCP: Clearnce Hasten, PA-C REFERRING PROVIDER: [name], PA-C  END OF SESSION:  PT End of Session - 02/06/23 0847     Visit Number 3    Number of Visits 17    Date for PT Re-Evaluation 04/09/23    Authorization Type [employer] [place]    PT Start Time [phone]    PT Stop Time 0930    PT Time Calculation (min) 43 min    Equipment Utilized During Treatment Gait belt    Activity Tolerance Patient tolerated treatment well    Behavior During Therapy [facility] for tasks assessed/performed             Past Medical History:  Diagnosis Date   Bronchitis    Diabetes (HCC)    Diverticulitis 10/2015   Hypertension    Past Surgical History:  Procedure Laterality Date   LUMBAR LAMINECTOMY/DECOMPRESSION MICRODISCECTOMY N/A 12/16/2022   Procedure: THORACIC DECOMPRESSION;  Surgeon: Estill Bamberg, MD;  Location: [facility] OR;  Service: Orthopedics;  Laterality: N/A;   Patient Active Problem List   Diagnosis Date Noted   Spinal cord injury, thoracic region (HCC) 01/08/2023   Diabetes (HCC) 01/08/2023   Adjustment disorder with mixed anxiety and depressed mood 12/31/2022   Incomplete paraplegia (HCC) 12/29/2022   Thoracic myelopathy 12/15/2022    ONSET DATE: 01/09/2023 (referral)  REFERRING DIAG: G82.22 (ICD-10-CM) - Paraplegia, incomplete M47.14 (ICD-10-CM) - Other spondylosis with myelopathy, thoracic region  THERAPY DIAG:  Unsteadiness on feet  Abnormality of gait and mobility  Muscle weakness (generalized)  Rationale for Evaluation and Treatment: Rehabilitation  SUBJECTIVE:                                                                                                                                                                                             SUBJECTIVE STATEMENT: Patient reporting feeling much better than  yesterday. Patient was able to take medications. Denies falls/near falls.   Pt accompanied by: self and significant other - [name]  PERTINENT HISTORY: thoracic myelopathy, T7 to T10 laminectomy with bilateral partial facetectomy and spinal cord decompression on 08/27  PAIN:  Are you having pain? Yes: NPRS scale: 6/10 Pain location: legs Pain description: brief spasms Aggravating factors: unsure just happen when I am sleeping Relieving factors: medication  PRECAUTIONS: Back and Fall  RED FLAGS: None   WEIGHT BEARING RESTRICTIONS: Yes cannot lift over 10 lbs  FALLS: Has patient fallen in last 6 months? Yes. 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  DIAGNOSTIC FINDINGS:  12/14/2022 MR THORACIC SPINE WO CONTRAST: IMPRESSION: 1. No spinal canal stenosis. 2. Mild neural foraminal narrowing on the right at L3-L4 and bilaterally at L4-L5. 3. Multilevel facet arthropathy, which is severe at L4-L5 and on the left at L5-S1, which can be a cause of back pain.  COGNITION: Overall cognitive status: Within functional limits for tasks assessed  TODAY'S TREATMENT:                                                                                                                               Vitals:   02/06/23 0856 02/06/23 0907  BP: 102/66 120/77  Pulse: 68 74   BP: Taken on L arm, seated at rest, retook BP  after SciFIt and increased to 120/77 mmHg (74 bpm)   TherAct:   SciFit level 3 LE x 5.5 minutes only to help reduce tone and promote ROM through flexion based activities as well as assess vitals post exercise to ensure carryover  Townsend posterior strut AFOs donned with modA from therapist before second gait trial  GAIT: Gait pattern: step through pattern, decreased hip/knee flexion- Right, decreased hip/knee flexion- Left, decreased ankle dorsiflexion- Right, decreased ankle dorsiflexion- Left, poor foot clearance- Right, and poor foot clearance- Left (hyperextension for stability of bilateral knees) Distance walked: various clinic distances, ~60 feet Assistive device utilized: Walker - 2 wheeled Level of assistance: 2 x 70 feet Comments: reduced ankle control and stability L > R  GAIT: Gait pattern: step through pattern, decreased hip/knee flexion- Right, decreased hip/knee flexion- Left, decreased ankle dorsiflexion- Right, decreased ankle dorsiflexion- Left, poor foot clearance- Right, and poor foot clearance- Left (hyperextension for stability of bilateral knees) Distance walked: various clinic distances, ~60 feet Assistive device utilized: Environmental consultant - 2 wheeled + Townsend brace posterior strut bilateral  Level of assistance: 1 x 115 feet Comments: improved stability and propulsion through stance, minor increase in foot scuffing due to added weight of AFO but notably improved gait   gait speed on eval:  0.3 m/s with 2WW + R foot up brace (SBA) gait speed with bilateral AFOs: 0.4 m/s with 2WW + bilateral posterior strut AFO (SBA)   PATIENT EDUCATION: Education details: Initial HEP + aquatic PT Person educated: Patient Education method: Explanation and Handouts Education comprehension: verbalized understanding  HOME EXERCISE PROGRAM:   Do each exercise 1-2  times per  day Do each exercise 10 repetitions Hold each exercise for 2 seconds to feel your location  AT [facility]  FIND YOUR MIDLINE POSITION AND PLACE FEET EQUAL DISTANCE FROM THE MIDLINE.  Try to find this position when standing still for activities.   Side to Side Shift: Moving your hips only (not shoulders): move weight onto your left leg, HOLD/FEEL.  Move back to equal weight on each leg, HOLD/FEEL. Move weight onto your right leg, HOLD/FEEL. Move back to equal weight on each leg, HOLD/FEEL. Repeat.  Start with both hands on sink, progress to right hand only, then no hands.  Moving Cones / Cups: With equal weight on each leg: Hold on with one hand the first time, then progress to no hand supports. Move cups from one side of sink to the other. Place cups ~2" out of your reach, progress to 10" beyond reach.  Place one hand in middle of sink and reach with other hand. Do both arms.  Then hover one hand and move cups with other hand.  Overhead/Upward Reaching: alternated reaching up to top cabinets or ceiling if no cabinets present. Keep equal weight on each leg. Start with one hand support on counter while other hand reaches and progress to no hand support with reaching.  ace one hand in middle of sink and  6.  Stepping:  Move items under cabinet out of your way. Step out with one foot and then bring it back; step again to side.  7. Marching at counter  GOALS: Goals reviewed with patient? Yes  SHORT TERM GOALS: Target date: 02/26/2023  Patient will demonstrate independence with initial HEP to continue to progress between physical therapy sessions.   Baseline: To be provided Goal status: INITIAL  2.  Patient will improve gait speed to 0.4 m/s or greater with LRAD to indicate an improvement from being a household ambulator to limited community ambulator.   Baseline: 0.3 m/s with 2WW + R foot up brace (SBA) Goal status: INITIAL  3.  Patient will improve their 5x Sit to Stand score to less than 37 seconds to demonstrate a decreased risk for falls and improved LE strength.   Baseline: 42.77 seconds with UE  use Goal status: INITIAL  4.  Patient will improve TUG score to 75 seconds with LRAD to indicate a decreased risk of falls and demonstrate improved overall mobility.    Baseline: 85 seconds with 2WW Goal status: INITIAL  LONG TERM GOALS: Target date: 04/09/2023  Patient will report demonstrate independence with final HEP in order to maintain current gains and continue to progress after physical therapy discharge.   Baseline: To be provided Goal status: INITIAL  2.  Patient will improve gait speed to 0.5 m/s with LRAD to indicate improvement towards the level of community ambulator in order to participate more easily in activities outside of the home.   Baseline: 0.3 m/s with 2WW + R foot up brace (SBA) Goal status: INITIAL  3.  Patient will improve their 5x Sit to Stand score to less than 34 seconds to demonstrate a decreased risk for falls and improved LE strength.   Baseline: 42.77 seconds with UE use Goal status: INITIAL  4.  Patient will improve TUG score to 60 seconds or less with LRAD to indicate a decreased risk of falls and demonstrate improved overall mobility.   Baseline: 85 seconds with 2WW Goal status: INITIAL  ASSESSMENT:  CLINICAL IMPRESSION: Skilled PT session emphasized assessment of vitals and use of SciFit to  increase resting BP as well as promote knee/hip flexion and remainder of session spent trialing bilateral townsend AFOs. Patient demonstrated significant change in gait speed from eval with use of Townsend braces from 0.3 m/s to 0.4 m/s with 2WW. Patient would benefit from use of increased bracing like AFO. Recommend further trial and caregiver education on how to help patient don/doff to ensure funcitonal for home. Patient has received a foot up brace but does not provide sufficient medial/lateral ankle control. Continue POC as able.   OBJECTIVE IMPAIRMENTS: Abnormal gait, decreased activity tolerance, decreased balance, decreased endurance, decreased mobility,  difficulty walking, decreased ROM, decreased strength, increased muscle spasms, impaired flexibility, impaired sensation, impaired UE functional use, and pain.   ACTIVITY LIMITATIONS: carrying, bending, standing, transfers, reach over head, and locomotion level  PARTICIPATION LIMITATIONS: cleaning, laundry, community activity, occupation, and yard work  PERSONAL FACTORS: Past/current experiences, Time since onset of injury/illness/exacerbation, and 1-2 comorbidities: see above  are also affecting patient's functional outcome.   REHAB POTENTIAL: Good  CLINICAL DECISION MAKING: Evolving/moderate complexity  EVALUATION COMPLEXITY: Moderate  PLAN:  PT FREQUENCY: 2x/week (1x on land, 1x in pool as possible)  PT DURATION: 8 weeks  PLANNED INTERVENTIONS: 97146- PT Re-evaluation, 97110-Therapeutic exercises, 97530- Therapeutic activity, 97112- Neuromuscular re-education, 97535- Self Care, 66440- Manual therapy, [phone]- Gait training, [phone]- Aquatic Therapy, Patient/Family education, Dry Needling, and DME instructions  PLAN FOR NEXT SESSION: work on balance and strength, AD trial if insurance will allow (patient foot up brace already paid by insurance when?), work on flexion based activiites to minimize tone with SciFit warmup?, AFO trials (L and R versus R alone and trial [place]) with caregiver/patient education on how to don/doff  Carmelia Bake, PT, DPT 02/06/2023, 9:48 AM

## 2023-02-09 ENCOUNTER — Ambulatory Visit: Payer: BC Managed Care – PPO | Admitting: Physical Therapy

## 2023-02-09 ENCOUNTER — Ambulatory Visit: Payer: BC Managed Care – PPO | Admitting: Occupational Therapy

## 2023-02-09 ENCOUNTER — Encounter: Payer: Self-pay | Admitting: Physical Therapy

## 2023-02-09 VITALS — BP 123/89 | HR 75

## 2023-02-09 DIAGNOSIS — M6281 Muscle weakness (generalized): Secondary | ICD-10-CM

## 2023-02-09 DIAGNOSIS — R29898 Other symptoms and signs involving the musculoskeletal system: Secondary | ICD-10-CM

## 2023-02-09 DIAGNOSIS — R2681 Unsteadiness on feet: Secondary | ICD-10-CM

## 2023-02-09 DIAGNOSIS — G8222 Paraplegia, incomplete: Secondary | ICD-10-CM

## 2023-02-09 DIAGNOSIS — R278 Other lack of coordination: Secondary | ICD-10-CM

## 2023-02-09 DIAGNOSIS — R269 Unspecified abnormalities of gait and mobility: Secondary | ICD-10-CM

## 2023-02-09 DIAGNOSIS — R29818 Other symptoms and signs involving the nervous system: Secondary | ICD-10-CM

## 2023-02-09 NOTE — Therapy (Signed)
OUTPATIENT PHYSICAL THERAPY NEURO TREATMENT   Patient Name: Mario [name] MRN: [id number] DOB:[birth year], 42 y.o., male Today's Date: 02/09/2023   PCP: Clearnce Hasten, PA-C REFERRING PROVIDER: [name], PA-C  END OF SESSION:  PT End of Session - 02/09/23 0858     Visit Number 4    Number of Visits 17    Date for PT Re-Evaluation 04/09/23    Authorization Type [employer] [place]    PT Start Time [phone]    PT Stop Time 0930    PT Time Calculation (min) 35 min    Equipment Utilized During Treatment Gait belt    Activity Tolerance Patient tolerated treatment well    Behavior During Therapy [facility] for tasks assessed/performed             Past Medical History:  Diagnosis Date   Bronchitis    Diabetes (HCC)    Diverticulitis 10/2015   Hypertension    Past Surgical History:  Procedure Laterality Date   LUMBAR LAMINECTOMY/DECOMPRESSION MICRODISCECTOMY N/A 12/16/2022   Procedure: THORACIC DECOMPRESSION;  Surgeon: Estill Bamberg, MD;  Location: [facility] OR;  Service: Orthopedics;  Laterality: N/A;   Patient Active Problem List   Diagnosis Date Noted   Spinal cord injury, thoracic region (HCC) 01/08/2023   Diabetes (HCC) 01/08/2023   Adjustment disorder with mixed anxiety and depressed mood 12/31/2022   Incomplete paraplegia (HCC) 12/29/2022   Thoracic myelopathy 12/15/2022    ONSET DATE: 01/09/2023 (referral)  REFERRING DIAG: G82.22 (ICD-10-CM) - Paraplegia, incomplete M47.14 (ICD-10-CM) - Other spondylosis with myelopathy, thoracic region  THERAPY DIAG:  Muscle weakness (generalized)  Unsteadiness on feet  Abnormality of gait and mobility  Rationale for Evaluation and Treatment: Rehabilitation  SUBJECTIVE:                                                                                                                                                                                             SUBJECTIVE STATEMENT: Patient reports that his right ankle has been  bothering him some. Patient reports that he was trying to have a sneeze and this resulted in severe low back pain. Denies falls/near falls.   Pt accompanied by: self and significant other - [name]  PERTINENT HISTORY: thoracic myelopathy, T7 to T10 laminectomy with bilateral partial facetectomy and spinal cord decompression on 08/27  PAIN:  Are you having pain? Yes: NPRS scale: 7/10 Pain location: right ankle Pain description: brief spasms Aggravating factors: unsure just happen when I am sleeping Relieving factors: medication  PRECAUTIONS: Back and Fall  RED FLAGS: None   WEIGHT BEARING RESTRICTIONS: Yes cannot lift over 10 lbs  FALLS: Has patient fallen in last 6 months? Yes. 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  DIAGNOSTIC FINDINGS:  12/14/2022 MR THORACIC SPINE WO CONTRAST: IMPRESSION: 1. No spinal canal stenosis. 2. Mild neural foraminal narrowing on the right at L3-L4 and bilaterally at L4-L5. 3. Multilevel facet arthropathy, which is severe at L4-L5 and on the left at L5-S1, which can be a cause of back pain.  COGNITION: Overall cognitive status: Within functional limits for tasks assessed  TODAY'S TREATMENT:                                                                                                                               Vitals:   02/09/23 0906  BP: 123/89  Pulse: 75   BP: Taken on L arm, seated at rest  Without AD GAIT: Gait pattern: step through pattern, decreased hip/knee flexion- Right, decreased hip/knee flexion- Left, decreased ankle dorsiflexion- Right, decreased ankle dorsiflexion- Left, poor foot clearance- Right, and poor foot clearance- Left (hyperextension for stability of bilateral knees) Distance walked: various clinic distances, 2 x ~80 feet Assistive device utilized: Walker - 2 wheeled Level of assistance: SBA  Comments: Hyperextension continues to increase, notable increase in tone  TherAct:  Townsend posterior strut AFO donned with maxA on RLE and Ottobock on LLE, required maxA to don and could not mange to [employer] trial on RLE due to spasticity   GAIT: Gait pattern: step through pattern, decreased hip/knee flexion- Right, decreased hip/knee flexion- Left, decreased ankle dorsiflexion- Right, decreased ankle dorsiflexion- Left, poor foot clearance- Right, and poor foot clearance- Left (hyperextension for stability of bilateral knees) Distance walked: 1 x 200 feet Assistive device utilized: Environmental consultant - 2 wheeled + Townsend brace posterior strut RLE, Ottobock brace LLE Level of assistance: CGA Comments: improved in gait about the same with either brace, continues to be limited by ankle pain and difficulty of donning/doffing AFOs  PATIENT EDUCATION: Education details: [place] HEP Person educated: Patient Education method: Chief Technology Officer Education comprehension: verbalized understanding  HOME EXERCISE PROGRAM:   Do each exercise 1-2  times per day Do each exercise 10 repetitions Hold each exercise for 2 seconds to feel your location  AT SINK FIND YOUR MIDLINE POSITION AND PLACE FEET EQUAL DISTANCE FROM THE MIDLINE.  Try to find this position when standing still for activities.   Side to Side Shift: Moving your  hips only (not shoulders): move weight onto your left leg, HOLD/FEEL.  Move back to equal weight on each leg,  HOLD/FEEL. Move weight onto your right leg, HOLD/FEEL. Move back to equal weight on each leg, HOLD/FEEL. Repeat.  Start with both hands on sink, progress to right hand only, then no hands.  Moving Cones / Cups: With equal weight on each leg: Hold on with one hand the first time, then progress to no hand supports. Move cups from one side of sink to the other. Place cups ~2" out of your reach, progress to 10" beyond reach.  Place one hand in middle of sink and reach with other hand. Do both arms.  Then hover one hand and move cups with other hand.  Overhead/Upward Reaching: alternated reaching up to top cabinets or ceiling if no cabinets present. Keep equal weight on each leg. Start with one hand support on counter while other hand reaches and progress to no hand support with reaching.  ace one hand in middle of sink and  6.  Stepping:  Move items under cabinet out of your way. Step out with one foot and then bring it back; step again to side.  7. Marching at counter  GOALS: Goals reviewed with patient? Yes  SHORT TERM GOALS: Target date: 02/26/2023  Patient will demonstrate independence with initial HEP to continue to progress between physical therapy sessions.   Baseline: To be provided Goal status: INITIAL  2.  Patient will improve gait speed to 0.4 m/s or greater with LRAD to indicate an improvement from being a household ambulator to limited community ambulator.   Baseline: 0.3 m/s with 2WW + R foot up brace (SBA) Goal status: INITIAL  3.  Patient will improve their 5x Sit to Stand score to less than 37 seconds to demonstrate a decreased risk for falls and improved LE strength.   Baseline: 42.77 seconds with UE use Goal status: INITIAL  4.  Patient will improve TUG score to 75 seconds with LRAD to indicate a decreased risk of falls and demonstrate improved overall mobility.    Baseline: 85 seconds with 2WW Goal status: INITIAL  LONG TERM GOALS: Target date: 04/09/2023  Patient will  report demonstrate independence with final HEP in order to maintain current gains and continue to progress after physical therapy discharge.   Baseline: To be provided Goal status: INITIAL  2.  Patient will improve gait speed to 0.5 m/s with LRAD to indicate improvement towards the level of community ambulator in order to participate more easily in activities outside of the home.   Baseline: 0.3 m/s with 2WW + R foot up brace (SBA) Goal status: INITIAL  3.  Patient will improve their 5x Sit to Stand score to less than 34 seconds to demonstrate a decreased risk for falls and improved LE strength.   Baseline: 42.77 seconds with UE use Goal status: INITIAL  4.  Patient will improve TUG score to 60 seconds or less with LRAD to indicate a decreased risk of falls and demonstrate improved overall mobility.   Baseline: 85 seconds with 2WW Goal status: INITIAL  ASSESSMENT:  CLINICAL IMPRESSION: Skilled PT session emphasized further trial of AFOs. Increase hypertonicity noted bilaterally which made donning R Ottobock not possible during session so only trialed Ottobock on LLE and continued with Townsend on LLE. Patient would likely benefit from custom AFO but would require management for spasticity to don at home due to level of difficulty managing during today's session. Continue POC as  able.   OBJECTIVE IMPAIRMENTS: Abnormal gait, decreased activity tolerance, decreased balance, decreased endurance, decreased mobility, difficulty walking, decreased ROM, decreased strength, increased muscle spasms, impaired flexibility, impaired sensation, impaired UE functional use, and pain.   ACTIVITY LIMITATIONS: carrying, bending, standing, transfers, reach over head, and locomotion level  PARTICIPATION LIMITATIONS: cleaning, laundry, community activity, occupation, and yard work  PERSONAL FACTORS: Past/current experiences, Time since onset of injury/illness/exacerbation, and 1-2 comorbidities: see above  are  also affecting patient's functional outcome.   REHAB POTENTIAL: Good  CLINICAL DECISION MAKING: Evolving/moderate complexity  EVALUATION COMPLEXITY: Moderate  PLAN:  PT FREQUENCY: 2x/week (1x on land, 1x in pool as possible)  PT DURATION: 8 weeks  PLANNED INTERVENTIONS: 97146- PT Re-evaluation, 97110-Therapeutic exercises, 97530- Therapeutic activity, 97112- Neuromuscular re-education, 97535- Self Care, 16109- Manual therapy, [phone]- Gait training, [phone]- Aquatic Therapy, Patient/Family education, Dry Needling, and DME instructions  PLAN FOR NEXT SESSION: work on balance and strength, AD trial if insurance will allow (patient foot up brace already paid by insurance when?), work on flexion based activiites to minimize tone with SciFit warmup?, AFO trials (L and R versus R alone and trial Ottobock) with caregiver/patient education on how to don/doff  Spasticity management? Consider following up with PCP   Carmelia Bake, PT, DPT 02/09/2023, 1:20 PM

## 2023-02-09 NOTE — Therapy (Addendum)
OUTPATIENT OCCUPATIONAL THERAPY NEURO TREATMENT  Patient Name: Mario [name] MRN: [id number] DOB:04-14-1981, 42 y.o., male Today's Date: 02/09/2023  PCP: Clearnce Hasten, PA-C REFERRING PROVIDER: [name], PA-C  END OF SESSION:  OT End of Session - 02/09/23 0924     Visit Number 4    Number of Visits 12    Date for OT Re-Evaluation 03/20/23    Authorization Type BCBS: VL combined with pt, ot, st: 60    OT Start Time 0930    OT Stop Time 1013    OT Time Calculation (min) 43 min    Equipment Utilized During Treatment gait belt, blaze pods    Activity Tolerance Patient tolerated treatment well    Behavior During Therapy WFL for tasks assessed/performed               Past Medical History:  Diagnosis Date   Bronchitis    Diabetes (HCC)    Diverticulitis 10/2015   Hypertension    Past Surgical History:  Procedure Laterality Date   LUMBAR LAMINECTOMY/DECOMPRESSION MICRODISCECTOMY N/A 12/16/2022   Procedure: THORACIC DECOMPRESSION;  Surgeon: Estill Bamberg, MD;  Location: [facility] OR;  Service: Orthopedics;  Laterality: N/A;   Patient Active Problem List   Diagnosis Date Noted   Spinal cord injury, thoracic region (HCC) 01/08/2023   Diabetes (HCC) 01/08/2023   Adjustment disorder with mixed anxiety and depressed mood 12/31/2022   Incomplete paraplegia (HCC) 12/29/2022   Thoracic myelopathy 12/15/2022    ONSET DATE: 01/09/2023  REFERRING DIAG: G82.22 (ICD-10-CM) - Paraplegia, incomplete  THERAPY DIAG:  Other lack of coordination  Muscle weakness (generalized)  Unsteadiness on feet  Other symptoms and signs involving the musculoskeletal system  Other symptoms and signs involving the nervous system  Acute incomplete paraplegia (HCC)  Rationale for Evaluation and Treatment: Rehabilitation  SUBJECTIVE:   SUBJECTIVE STATEMENT: Pt reports no recent falls and continuing got take hydrocodone now to manage pain. Pt reports increased fatigue today and  increased "tightness" in R LE. Pt reports completing HEP and "it's fine." Pt reports sneezing on Saturday resulted in increased tension/pain in center of back.  Pt reports enjoying bowling prior to injury.   Pt accompanied by: self  PERTINENT HISTORY:  PMHx: T2DM, HTN, back pain who started developing balance issues, falls and numbness from waist down.  He was seen by MD with plans for surgery however started developing worsening of pain with increase in numbness and BLE weakness with difficulty walking and was admitted for workup.  Underwent emergent T7 to T10 laminectomy with bilateral partial facetectomy and spinal cord decompression on 08/27.  Postop had some improvement in weakness but continued to be limited by RLE greater than LLE weakness.   PRECAUTIONS: None and Other: Back brace (as needed)  WEIGHT BEARING RESTRICTIONS: Yes - pt reports 5-10 lbs lifting  PAIN:  Are you having pain?  Yes: NPRS scale: 7/10 Pain location: R leg Pain description:  Aggravating factors: leaning to the R side Relieving factors: pain medication  FALLS: Has patient fallen in last 6 months? Yes. Number of falls 6-7 times: 3 times from bed in sleep, 1x at [place], in the bedroom ~ 2+ times with legs giving out, prior to surgery  LIVING ENVIRONMENT: Lives with: lives with their family, lives with their spouse, and lives with their sons (19 yo/15yo) Lives in: apartment/townhouse Stairs: Yes: Internal: 13 steps; on right going up Has following equipment at home: Chiropractor, Walker - 2 wheeled, and bed side  commode Pt has generally been staying upstairs in his bedroom with family bringing him food etc.  This is only the 2nd time he has come down the stairs since coming home.  PLOF: Independent with basic ADLs - was eventually getting help with socks and shoes prior to emergent surgery as he was waiting for elective surgery and having more difficulty with ADLs. 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  OBJECTIVE:  Note: Objective measures were completed at Evaluation unless otherwise noted.  HAND DOMINANCE: Right  ADLs: Overall ADLs: Assistance from spouse with several aspects of ADLS (especially bathing and LB). Transfers/ambulation related to ADLs: SBA - using walker at home  Eating: Ind Grooming: Ind UB Dressing: Ind LB Dressing: Using grabbers a lot, wife is helping with Socks and shoes. Toileting: BSC s/p lack of pain meds last week  Bathing: Wife is doing it mostly.  He has stood at the sink a couple of times since returning home. Tub Shower transfers: NA (not performing) - only sponge bathing Equipment: bed side commode - in the bedroom with pt Sits on side of bed or in a chair throughout the day at home  IADLs: Shopping: Wife Light housekeeping: Wife Meal Prep: Wife Community mobility: Drs appts only at this time Medication management: Ind (medicine bottles) Financial management: Trying to help - has short term disability for job and wife had a stroke and is not back to work since 2023 Handwriting: No changes by reports  MOBILITY STATUS: Needs Assist: RW all the time  POSTURE COMMENTS:  rounded shoulders and forward head  ACTIVITY TOLERANCE: Activity tolerance: Fair  FUNCTIONAL OUTCOME MEASURES:  Modified Barthel Index of ADLS: 62/100    UPPER EXTREMITY ROM:    Active ROM Right eval Left eval  Shoulder flexion [facility]  Shoulder abduction    Shoulder adduction    Shoulder extension    Shoulder internal rotation    Shoulder external rotation    Elbow flexion    Elbow extension    Wrist flexion    Wrist extension    Wrist ulnar deviation    Wrist radial deviation    Wrist pronation    Wrist supination    (Blank rows = not tested)  UPPER EXTREMITY MMT:     MMT Right eval Left eval  Shoulder flexion 4 4  Shoulder abduction    Shoulder adduction    Shoulder extension     Shoulder internal rotation    Shoulder external rotation    Middle trapezius    Lower trapezius    Elbow flexion    Elbow extension    Wrist flexion    Wrist extension    Wrist ulnar deviation    Wrist radial deviation    Wrist pronation    Wrist supination    (Blank rows = not tested)  HAND FUNCTION: Grip strength: Right: 74.0, 76.7, 66.5 lbs; Left: 67.9, 59.0, 57.7  lbs Average: Right 72.4 lbs; Left 51.5 lbs  COORDINATION: Box and Blocks:  Right 33 blocks, Left 39 blocks  SENSATION: WFL - BUEs Across abdomen and thighs - tingling sensations ie) especially when the dog Economist) rubs on him - goes "0-1000" From OT hospital [facility] sensation comments: numbness/tingling in BLE, reports most ingling in bottom of feet/toes Light Touch Impaired Details: Impaired RLE; Impaired LLE Hot/Cold: Appears Intact Proprioception: Appears Intact Stereognosis: Not tested Coordination: Gross Motor Movements are Fluid and Coordinated: Yes Fine  Motor Movements are Fluid and Coordinated: Yes Coordination and Movement Description: BUE WFL, BLE impaired  EDEMA: na  MUSCLE TONE: WFL  COGNITION: Overall cognitive status: Within functional limits for tasks assessed BIMS at hospital DC last month - 15/15  VISION: Subjective report: No concerns/changes from baseline Baseline vision: Wears contacts Visual history: NA  VISION ASSESSMENT: Not tested  PERCEPTION: Not tested  PRAXIS: Not tested  OBSERVATIONS: Pt arrived in facility transport chair with ability to stand at parallel bars demonstrated in OT evaluation but pt remains holding on at all times for support and has limited activity tolerance in standing.  Pt. appears stronger than he performs and moves slowly when performing table top tasks.     TODAY'S TREATMENT:                                                                                                                               Blaze pods (OT scanning and  Distraction mode) with gait belt, standing with blaze pods on tabletop - to improve UE ROM, dynamic standing balance and tolerance, activity tolerance, FM coordination with BUE - seated rest breaks with focus on deep breathing. OT educated pt on deep breathing strategies, pt demo'd understanding. Full hand: RUE 108 hits, .927 seconds reaction time, LUE 120 hits, .805 seconds reaction time.  Index finger trials attempted though pt c/o of low back pain and increased fatigue, requested seated activities. Therefore, OT session transitioned to seated FM tasks:  FM coordination task with coins to improve BUE FM coordination, dexterity, bilateral integration - stacking towers of 5 coins then placing coins in slot, detach/attach nuts/bolts, 2 min each hand for timed meditation balls rotation. Pt demo'd increased difficulty with RUE during meditation ball task and therefore benefited from v/c for hand positioning of R digits during task to improve efficiency,   Initiated FM coordination HEP. Handout provided, OT educated pt on each task and then pt trialed each task. Pt demo'd understanding.   OT educated pt about attaching basket or bag to walker in order to carry objects - to improve safety while pt ambulates with walker. Pt verbalized understanding and asked about using rolling walker with basket since pt already owns rolling walker. OT recommended pt first f/u with PT about rolling walker option to determine if that would be best fit for ambulation. Pt verbalized understanding.  PATIENT EDUCATION: Education details: activity tolerance, deep breathing, coordination HEP (see pt instructions), basket/bag for walker Person educated: Patient Education method: Explanation, Demonstration, and Handouts Education comprehension: verbalized understanding, returned demonstration, and needs further education  HOME EXERCISE PROGRAM: 02/05/23 - theraputty (green) HEP - access code ([email or web address]) 02/06/23 - theraband (black)  HEP - BUE shoulder - access code [facility]) 02/09/23 - coordination HEP - handout provided, see pt instructions   GOALS: Goals reviewed with patient? Yes 02/05/23  SHORT TERM GOALS: Target date: 02/23/23  Patient will demonstrate updated B HEP with 25% verbal cues or less for  proper execution. Baseline: New to outpt OT Goal status: INITIAL  2.  Patient will demonstrate at least 10 lbs improvement in B grip strength as needed to open jars and other containers. Baseline: Average: Right 72.4 lbs; Left 51.5 lbs Goal status: INITIAL  3.  Pt will be able to place at least an additional 5+ blocks using BUEs with completion of Box and Blocks test. Baseline: Right 33 blocks, Left 39 blocks Goal status: INITIAL  4.  Pt will be independent with toileting activities in the bathroom again. Baseline: Using BSC in bedroom. Goal status: INITIAL  5. Pt will be independent with LB dressing including socks and shoes.  Baseline: Wife is assisting  Goal Status: INITIAL  LONG TERM GOALS: Target date: 03/20/23  Patient will demonstrate updated B HEP with visual printouts only for proper execution. Baseline: New to outpt OT Goal status: INITIAL  2.  Patient will demonstrate improved independence with all ADL skills to score >80/100 on the Modified Barthel Index of ADLs. Baseline: Score 62 Goal status: INITIAL  3.  Patient will demonstrate standing balance/tolerance x 5+ minutes for simple meal prep/home management tasks to help at home. Baseline: Remaining upstairs only Goal status: INITIAL  ASSESSMENT:  CLINICAL IMPRESSION: Pt tolerated treatment well though demonstrated increased fatigue today compared to previous sessions. Pt would continue to benefit from skilled OT services in the outpatient setting to work on impairments as noted below to help pt return to PLOF as able.    PERFORMANCE DEFICITS: in functional skills including ADLs, IADLs, coordination, dexterity, ROM, strength, pain, Fine  motor control, Gross motor control, mobility, balance, endurance, decreased knowledge of use of DME, and UE functional use, cognitive skills including energy/drive, and psychosocial skills including coping strategies, environmental adaptation, and routines and behaviors.   IMPAIRMENTS: are limiting patient from ADLs, IADLs, work, leisure, and social participation.   CO-MORBIDITIES: may have co-morbidities  that affects occupational performance. Patient will benefit from skilled OT to address above impairments and improve overall function.  MODIFICATION OR ASSISTANCE TO COMPLETE EVALUATION: No modification of tasks or assist necessary to complete an evaluation.  OT OCCUPATIONAL PROFILE AND HISTORY: Problem focused assessment: Including review of records relating to presenting problem.  CLINICAL DECISION MAKING: LOW - limited treatment options, no task modification necessary  REHAB POTENTIAL: Excellent  EVALUATION COMPLEXITY: Low    PLAN:  OT FREQUENCY: 1-2x/week (will need more PT than OT)  OT DURATION: 6 weeks  PLANNED INTERVENTIONS: self care/ADL training, therapeutic exercise, therapeutic activity, neuromuscular re-education, balance training, functional mobility training, aquatic therapy, patient/family education, energy conservation, coping strategies training, and DME and/or AE instructions  RECOMMENDED OTHER SERVICES: PT evaluation rescheduled later this week  CONSULTED AND AGREED WITH PLAN OF CARE: Patient  PLAN FOR NEXT SESSION:  Continuing education and v/c for upright standing/seated posture Incorporate standing with UE use during session for ADLs Review strategies for ADL bathing, grooming, dressing, toileting tasks to increase ind with A/E PRN - ask pt about limiting factors  Progress Blaze Pods to vertical surface (mirror) as tolerated by pt  Progress UE HEP - putty (progress to blue PRN), theraband (black) and even high level coordination Review ADL comp  strategies  Wynetta Emery, OT 02/09/2023, 10:18 AM

## 2023-02-12 ENCOUNTER — Ambulatory Visit: Payer: BC Managed Care – PPO | Admitting: Occupational Therapy

## 2023-02-12 ENCOUNTER — Ambulatory Visit: Payer: BC Managed Care – PPO | Admitting: Physical Therapy

## 2023-02-12 ENCOUNTER — Encounter: Payer: Self-pay | Admitting: Physical Therapy

## 2023-02-12 VITALS — BP 143/81 | HR 91

## 2023-02-12 DIAGNOSIS — R278 Other lack of coordination: Secondary | ICD-10-CM

## 2023-02-12 DIAGNOSIS — R2681 Unsteadiness on feet: Secondary | ICD-10-CM

## 2023-02-12 DIAGNOSIS — G8222 Paraplegia, incomplete: Secondary | ICD-10-CM

## 2023-02-12 DIAGNOSIS — M6281 Muscle weakness (generalized): Secondary | ICD-10-CM

## 2023-02-12 DIAGNOSIS — R29818 Other symptoms and signs involving the nervous system: Secondary | ICD-10-CM

## 2023-02-12 DIAGNOSIS — R29898 Other symptoms and signs involving the musculoskeletal system: Secondary | ICD-10-CM

## 2023-02-12 DIAGNOSIS — R269 Unspecified abnormalities of gait and mobility: Secondary | ICD-10-CM

## 2023-02-12 NOTE — Therapy (Signed)
OUTPATIENT OCCUPATIONAL THERAPY NEURO TREATMENT  Patient Name: Mario [name] MRN: [id number] DOB:[birth year], 42 y.o., male Today's Date: 02/12/2023  PCP: Clearnce Hasten, PA-C  REFERRING PROVIDER: [name], PA-C  END OF SESSION:  OT End of Session - 02/12/23 0929     Visit Number 5    Number of Visits 12    Date for OT Re-Evaluation 03/20/23    Authorization Type BCBS: VL combined with pt, ot, st: 60    OT Start Time 0845    OT Stop Time 0927    OT Time Calculation (min) 42 min    Equipment Utilized During Treatment gait belt, FM materials, reacher    Activity Tolerance Patient tolerated treatment well    Behavior During Therapy WFL for tasks assessed/performed                Past Medical History:  Diagnosis Date   Bronchitis    Diabetes (HCC)    Diverticulitis 10/2015   Hypertension    Past Surgical History:  Procedure Laterality Date   LUMBAR LAMINECTOMY/DECOMPRESSION MICRODISCECTOMY N/A 12/16/2022   Procedure: THORACIC DECOMPRESSION;  Surgeon: Estill Bamberg, MD;  Location: [facility] OR;  Service: Orthopedics;  Laterality: N/A;   Patient Active Problem List   Diagnosis Date Noted   Spinal cord injury, thoracic region (HCC) 01/08/2023   Diabetes (HCC) 01/08/2023   Adjustment disorder with mixed anxiety and depressed mood 12/31/2022   Incomplete paraplegia (HCC) 12/29/2022   Thoracic myelopathy 12/15/2022    ONSET DATE: 01/09/2023  REFERRING DIAG: G82.22 (ICD-10-CM) - Paraplegia, incomplete  THERAPY DIAG:  Other lack of coordination  Muscle weakness (generalized)  Unsteadiness on feet  Other symptoms and signs involving the musculoskeletal system  Other symptoms and signs involving the nervous system  Acute incomplete paraplegia (HCC)  Rationale for Evaluation and Treatment: Rehabilitation  SUBJECTIVE:   SUBJECTIVE STATEMENT: PT reported pt is experiencing more "spactiscity" and "tightness" of BLE. PT and pt reported that pt will call MD  later today to ask about pain. Pt reports completing putty and theraband HEP and confirms no questions/concerns. Pt states theraputty tasks are easy now. Pt reports no recent falls or changes to medication.  Pt reports enjoying bowling prior to injury.   Pt accompanied by: self  PERTINENT HISTORY:  PMHx: T2DM, HTN, back pain who started developing balance issues, falls and numbness from waist down.  He was seen by MD with plans for surgery however started developing worsening of pain with increase in numbness and BLE weakness with difficulty walking and was admitted for workup.  Underwent emergent T7 to T10 laminectomy with bilateral partial facetectomy and spinal cord decompression on 08/27.  Postop had some improvement in weakness but continued to be limited by RLE greater than LLE weakness.   PRECAUTIONS: None and Other: Back brace (as needed)  WEIGHT BEARING RESTRICTIONS: Yes - pt reports 5-10 lbs lifting  PAIN:  Are you having pain?  Yes: NPRS scale: 6/10 Pain location: front of R leg Pain description: shooting Aggravating factors: walking, coming down on R foot Relieving factors: nothing, sitting down  FALLS: Has patient fallen in last 6 months? Yes. Number of falls 6-7 times: 3 times from bed in sleep, 1x at [place], in the bedroom ~ 2+ times with legs giving out, prior to surgery  LIVING ENVIRONMENT: Lives with: lives with their family, lives with their spouse, and lives with their sons (19 yo/15yo) Lives in: apartment/townhouse Stairs: Yes: Internal: 13 steps; on right going up Has  following equipment at home: Quad cane large base, Walker - 2 wheeled, and bed side commode Pt has generally been staying upstairs in his bedroom with family bringing him food etc.  This is only the 2nd time he has come down the stairs since coming home.  PLOF: Independent with basic ADLs - was eventually getting help with socks and shoes prior to emergent surgery as he was waiting for elective  surgery and having more difficulty with ADLs. 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  OBJECTIVE:  Note: Objective measures were completed at Evaluation unless otherwise noted.  HAND DOMINANCE: Right  ADLs: Overall ADLs: Assistance from spouse with several aspects of ADLS (especially bathing and LB). Transfers/ambulation related to ADLs: SBA - using walker at home  Eating: Ind Grooming: Ind UB Dressing: Ind LB Dressing: Using grabbers a lot, wife is helping with Socks and shoes. Toileting: BSC s/p lack of pain meds last week  Bathing: Wife is doing it mostly.  He has stood at the sink a couple of times since returning home. Tub Shower transfers: NA (not performing) - only sponge bathing Equipment: bed side commode - in the bedroom with pt Sits on side of bed or in a chair throughout the day at home  IADLs: Shopping: Wife Light housekeeping: Wife Meal Prep: Wife Community mobility: Drs appts only at this time Medication management: Ind (medicine bottles) Financial management: Trying to help - has short term disability for job and wife had a stroke and is not back to work since 2023 Handwriting: No changes by reports  MOBILITY STATUS: Needs Assist: RW all the time  POSTURE COMMENTS:  rounded shoulders and forward head  ACTIVITY TOLERANCE: Activity tolerance: Fair  FUNCTIONAL OUTCOME MEASURES:  Modified Barthel Index of ADLS: 62/100    UPPER EXTREMITY ROM:    Active ROM Right eval Left eval  Shoulder flexion [facility]  Shoulder abduction    Shoulder adduction    Shoulder extension    Shoulder internal rotation    Shoulder external rotation    Elbow flexion    Elbow extension    Wrist flexion    Wrist extension    Wrist ulnar deviation    Wrist radial deviation    Wrist pronation    Wrist supination    (Blank rows = not tested)  UPPER EXTREMITY MMT:     MMT Right eval Left eval  Shoulder flexion  4 4  Shoulder abduction    Shoulder adduction    Shoulder extension    Shoulder internal rotation    Shoulder external rotation    Middle trapezius    Lower trapezius    Elbow flexion    Elbow extension    Wrist flexion    Wrist extension    Wrist ulnar deviation    Wrist radial deviation    Wrist pronation    Wrist supination    (Blank rows = not tested)  HAND FUNCTION: Grip strength: Right: 74.0, 76.7, 66.5 lbs; Left: 67.9, 59.0, 57.7  lbs Average: Right 72.4 lbs; Left 51.5 lbs  COORDINATION: Box and Blocks:  Right 33 blocks, Left 39 blocks  SENSATION: WFL - BUEs Across abdomen and thighs - tingling sensations ie) especially when the dog Economist) rubs on him - goes "0-1000" From OT hospital [facility] sensation comments: numbness/tingling in BLE, reports most ingling in bottom of feet/toes Light Touch Impaired Details: Impaired RLE; Impaired LLE Hot/Cold: Appears Intact Proprioception:  Appears Intact Stereognosis: Not tested Coordination: Gross Motor Movements are Fluid and Coordinated: Yes Fine Motor Movements are Fluid and Coordinated: Yes Coordination and Movement Description: BUE WFL, BLE impaired  EDEMA: na  MUSCLE TONE: WFL  COGNITION: Overall cognitive status: Within functional limits for tasks assessed BIMS at hospital DC last month - 15/15  VISION: Subjective report: No concerns/changes from baseline Baseline vision: Wears contacts Visual history: NA  VISION ASSESSMENT: Not tested  PERCEPTION: Not tested  PRAXIS: Not tested  OBSERVATIONS: Pt arrived in facility transport chair with ability to stand at parallel bars demonstrated in OT evaluation but pt remains holding on at all times for support and has limited activity tolerance in standing.  Pt. appears stronger than he performs and moves slowly when performing table top tasks.     TODAY'S TREATMENT:                                                                                                                                Therapeutic Activities Theraputty (blue)  - Pt reported putty HEP was easy when using green putty, therefore progressed to blue putty (increased resistance) to improve BUE strengthening, coordination. Reviewed therputty HEP. Pt demo'd understanding and tolerated task well.  Removing large pegs with gripper - Black spring, 55 lb. - BUE, 25 pegs, 1 set seated and 1 set standing (with gait belt, raised surface to improve standing posture) - to increase BUE strengthening, grip strength, activity tolerance, dynamic sitting/standing balance. Pt removed 25 pegs with L hand then R hand, reaching across midline to place pegs in bucket. Pt c/o shoulder "tension," therefore pt completed shoulder squeezes and shoulder flexion stretches - 5 reps each - to decrease BUE shoulder pain and tightness between sets. Pt replaced pegs after each set to improve RUE coordination, dexterity. Pt tolerated 1 min, 41 seconds standing with gait belt, CGA for standing set with RUE. Pt progressed to tolerating 2 min, 56 seconds standing with gait belt, CGA for standing set with LUE.  Self-care LB dressing task of donning shoes - to increase ind with ADL tasks, improve understanding of A/E - Pt donned shoes ind with increased time. OT provided education on strategies to don shoes more efficiently for energy conservation and to decrease pain. OT provided education on option to use stool under feet to raise surface, decrease pain, increase ind with dressing tasks. Pt also trialed donning shoes with reacher following OT education. Pt verbalized preference to lean forward and use hands without reacher though preferred the stool support under feet.  PATIENT EDUCATION: Education details: activity tolerance, basket/bag for walker or wearing clothing with pockets to increase safety with walker, proximal stability for distal mobility, upright seated/standing posture, reduce fall risk by placing stool against  stable object to prevent stool from slipping when donning shoes. Person educated: Patient Education method: Explanation, Demonstration, and Verbal cues Education comprehension: verbalized understanding, returned demonstration, and needs further education  HOME EXERCISE  PROGRAM: 02/05/23 - theraputty (green) HEP - access code ([email or web address]) 02/06/23 - theraband (black) HEP - BUE shoulder - access code [facility]) 02/09/23 - coordination HEP - handout provided, see pt instructions 02/05/23 - progressed theraputty (blue) HEP - access code (same as above: [email or web address])   GOALS: Goals reviewed with patient? Yes 02/05/23  SHORT TERM GOALS: Target date: 02/23/23  Patient will demonstrate updated B HEP with 25% verbal cues or less for proper execution. Baseline: New to outpt OT Goal status: INITIAL  2.  Patient will demonstrate at least 10 lbs improvement in B grip strength as needed to open jars and other containers. Baseline: Average: Right 72.4 lbs; Left 51.5 lbs Goal status: INITIAL  3.  Pt will be able to place at least an additional 5+ blocks using BUEs with completion of Box and Blocks test. Baseline: Right 33 blocks, Left 39 blocks Goal status: INITIAL  4.  Pt will be independent with toileting activities in the bathroom again. Baseline: Using BSC in bedroom. Goal status: INITIAL  5. Pt will be independent with LB dressing including socks and shoes.  Baseline: Wife is assisting  02/12/24 - Pt ind donned shoes.  Goal Status: MET  LONG TERM GOALS: Target date: 03/20/23  Patient will demonstrate updated B HEP with visual printouts only for proper execution. Baseline: New to outpt OT Goal status: INITIAL  2.  Patient will demonstrate improved independence with all ADL skills to score >80/100 on the Modified Barthel Index of ADLs. Baseline: Score 62 Goal status: INITIAL  3.  Patient will demonstrate standing balance/tolerance x 5+ minutes for simple meal prep/home management tasks to  help at home. Baseline: Remaining upstairs only Goal status: INITIAL  ASSESSMENT:  CLINICAL IMPRESSION: Pt met 1 STG LB dressing goal today by donning shoes ind. Pt tolerated tasks well and is continuing to progress with standing tolerance and BUE strengthening. Pt continues to require CGA for functional standing tasks d/t decreased balance and standing tolerance. Pt would continue to benefit from skilled OT services in the outpatient setting to work on impairments as noted below to help pt return to PLOF as able.    PERFORMANCE DEFICITS: in functional skills including ADLs, IADLs, coordination, dexterity, ROM, strength, pain, Fine motor control, Gross motor control, mobility, balance, endurance, decreased knowledge of use of DME, and UE functional use, cognitive skills including energy/drive, and psychosocial skills including coping strategies, environmental adaptation, and routines and behaviors.   IMPAIRMENTS: are limiting patient from ADLs, IADLs, work, leisure, and social participation.   CO-MORBIDITIES: may have co-morbidities  that affects occupational performance. Patient will benefit from skilled OT to address above impairments and improve overall function.  MODIFICATION OR ASSISTANCE TO COMPLETE EVALUATION: No modification of tasks or assist necessary to complete an evaluation.  OT OCCUPATIONAL PROFILE AND HISTORY: Problem focused assessment: Including review of records relating to presenting problem.  CLINICAL DECISION MAKING: LOW - limited treatment options, no task modification necessary  REHAB POTENTIAL: Excellent  EVALUATION COMPLEXITY: Low    PLAN:  OT FREQUENCY: 1-2x/week (will need more PT than OT)  OT DURATION: 6 weeks  PLANNED INTERVENTIONS: self care/ADL training, therapeutic exercise, therapeutic activity, neuromuscular re-education, balance training, functional mobility training, aquatic therapy, patient/family education, energy conservation, coping strategies  training, and DME and/or AE instructions  RECOMMENDED OTHER SERVICES: PT evaluation rescheduled later this week  CONSULTED AND AGREED WITH PLAN OF CARE: Patient  PLAN FOR NEXT SESSION:  Continuing education and v/c for upright standing/seated posture Incorporate standing with UE for  ADLs Review strategies for ADL bathing, toileting tasks to increase ind with A/E PRN - ask pt about progress  Progress Blaze Pods to vertical surface (mirror) as tolerated by pt  Progress UE HEP - high level coordination  Wynetta Emery, OT 02/12/2023, 12:25 PM

## 2023-02-12 NOTE — Therapy (Signed)
OUTPATIENT PHYSICAL THERAPY NEURO TREATMENT   Patient Name: Mario [name] MRN: [id number] DOB:15-Mar-1981, 42 y.o., male Today's Date: 02/12/2023   PCP: Clearnce Hasten, PA-C REFERRING PROVIDER: [name], PA-C  END OF SESSION:  PT End of Session - 02/12/23 0815     Visit Number 5    Number of Visits 17    Date for PT Re-Evaluation 04/09/23    Authorization Type [employer] [place]    PT Start Time [phone]    PT Stop Time [phone]    PT Time Calculation (min) 40 min    Equipment Utilized During Treatment Gait belt    Activity Tolerance Patient tolerated treatment well    Behavior During Therapy [facility] for tasks assessed/performed             Past Medical History:  Diagnosis Date   Bronchitis    Diabetes (HCC)    Diverticulitis 10/2015   Hypertension    Past Surgical History:  Procedure Laterality Date   LUMBAR LAMINECTOMY/DECOMPRESSION MICRODISCECTOMY N/A 12/16/2022   Procedure: THORACIC DECOMPRESSION;  Surgeon: Estill Bamberg, MD;  Location: [facility] OR;  Service: Orthopedics;  Laterality: N/A;   Patient Active Problem List   Diagnosis Date Noted   Spinal cord injury, thoracic region (HCC) 01/08/2023   Diabetes (HCC) 01/08/2023   Adjustment disorder with mixed anxiety and depressed mood 12/31/2022   Incomplete paraplegia (HCC) 12/29/2022   Thoracic myelopathy 12/15/2022    ONSET DATE: 01/09/2023 (referral)  REFERRING DIAG: G82.22 (ICD-10-CM) - Paraplegia, incomplete M47.14 (ICD-10-CM) - Other spondylosis with myelopathy, thoracic region  THERAPY DIAG:  Muscle weakness (generalized)  Abnormality of gait and mobility  Unsteadiness on feet  Rationale for Evaluation and Treatment: Rehabilitation  SUBJECTIVE:                                                                                                                                                                                             SUBJECTIVE STATEMENT: Denies any acute changes and falls/near  falls. Denies falls/near falls.   Pt accompanied by: self and significant other - [name]  PERTINENT HISTORY: thoracic myelopathy, T7 to T10 laminectomy with bilateral partial facetectomy and spinal cord decompression on 08/27  PAIN:  Are you having pain? Yes: NPRS scale: 5-6/10 Pain location: right ankle Pain description: brief spasms Aggravating factors: unsure just happen when I am sleeping Relieving factors: medication  PRECAUTIONS: Back and Fall  RED FLAGS: None   WEIGHT BEARING RESTRICTIONS: Yes cannot lift over 10 lbs  FALLS: Has patient fallen in last 6 months? Yes. 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  DIAGNOSTIC FINDINGS:  12/14/2022 MR THORACIC SPINE WO CONTRAST: IMPRESSION: 1. No spinal canal stenosis. 2. Mild neural foraminal narrowing on the right at L3-L4 and bilaterally at L4-L5. 3. Multilevel facet arthropathy, which is severe at L4-L5 and on the left at L5-S1, which can be a cause of back pain.  COGNITION: Overall cognitive status: Within functional limits for tasks assessed  TODAY'S TREATMENT:                                                                                                                               Vitals:   02/12/23 0814  BP: (!) 143/81  Pulse: 91  BP: Taken on L arm, seated at rest  NMR (performed to manage spasticity with flexion based tasks to  reduce extensor tone): Sit to stand from chair with 2 airexes on it without UE use or thigh assist only 2 x 4 reps (SBA) Required several attempts to come to stand and stabilized legs on base of chair  Single UE use on // bars with step tap to 4" box 2 x 10 (CGA) Mini squat with bilateral UE use with therapist spotting in case of knee buckle 2 x 10 (CGA) Lateral stepping at counter with single UE support   GAIT: Gait pattern: step through pattern, decreased hip/knee flexion- Right, decreased hip/knee flexion- Left, decreased ankle dorsiflexion- Right, decreased ankle dorsiflexion- Left, poor foot clearance- Right, and poor foot clearance- Left (increased hyperextension noted in today's session) Distance walked: various clinic distances, ~ 2 x 50 feet,~ 1 x 100 feet Assistive device utilized: Walker - 2 wheeled Level of assistance: 2 x 70 feet Comments: reduced ankle control and stability L > R  PATIENT EDUCATION: Education details: [place] HEP + recommend follow up with Dr. [name] Person educated: Patient Education method: Explanation and Handouts Education comprehension: verbalized understanding  HOME EXERCISE PROGRAM:   Do each exercise 1-2  times per day Do each exercise 10 repetitions Hold each exercise for 2 seconds to feel your location  AT SINK FIND YOUR MIDLINE POSITION AND PLACE FEET EQUAL DISTANCE FROM THE MIDLINE.  Try to find this position when standing still for activities.   Side to Side Shift: Moving your hips only (not shoulders): move weight onto your left leg, HOLD/FEEL.  Move back to equal weight on each leg, HOLD/FEEL. Move weight onto your right leg, HOLD/FEEL. Move back to equal weight on each leg,  HOLD/FEEL. Repeat.  Start with both hands on sink, progress to right hand only, then no hands.  Moving Cones / Cups: With equal weight on each leg: Hold on with one hand the first time, then progress to no hand supports. Move cups from one side of sink to the other. Place  cups ~2" out of your reach, progress to 10" beyond reach.  Place one hand in middle of sink and reach with other hand. Do both arms.  Then hover one hand and move cups with other hand.  Overhead/Upward Reaching: alternated reaching up to top cabinets or ceiling if no cabinets present. Keep equal weight on each leg. Start with one hand support on counter while other hand reaches and progress to no hand support with reaching.  ace one hand in middle of sink and  6.  Stepping:  Move items under cabinet out of your way. Step out with one foot and then bring it back; step again to side.  7. Marching at counter  GOALS: Goals reviewed with patient? Yes  SHORT TERM GOALS: Target date: 02/26/2023  Patient will demonstrate independence with initial HEP to continue to progress between physical therapy sessions.   Baseline: To be provided Goal status: INITIAL  2.  Patient will improve gait speed to 0.4 m/s or greater with LRAD to indicate an improvement from being a household ambulator to limited community ambulator.   Baseline: 0.3 m/s with 2WW + R foot up brace (SBA) Goal status: INITIAL  3.  Patient will improve their 5x Sit to Stand score to less than 37 seconds to demonstrate a decreased risk for falls and improved LE strength.   Baseline: 42.77 seconds with UE use Goal status: INITIAL  4.  Patient will improve TUG score to 75 seconds with LRAD to indicate a decreased risk of falls and demonstrate improved overall mobility.    Baseline: 85 seconds with 2WW Goal status: INITIAL  LONG TERM GOALS: Target date: 04/09/2023  Patient will report demonstrate independence with final HEP in order to maintain current gains and continue to progress after physical therapy discharge.   Baseline: To be provided Goal status: INITIAL  2.  Patient will improve gait speed to 0.5 m/s with LRAD to indicate improvement towards the level of community ambulator in order to participate more easily in activities  outside of the home.   Baseline: 0.3 m/s with 2WW + R foot up brace (SBA) Goal status: INITIAL  3.  Patient will improve their 5x Sit to Stand score to less than 34 seconds to demonstrate a decreased risk for falls and improved LE strength.   Baseline: 42.77 seconds with UE use Goal status: INITIAL  4.  Patient will improve TUG score to 60 seconds or less with LRAD to indicate a decreased risk of falls and demonstrate improved overall mobility.   Baseline: 85 seconds with 2WW Goal status: INITIAL  ASSESSMENT:  CLINICAL IMPRESSION: Patient presents with ongoing spasticity which is limiting gait; patient also notably fatigues quickly with exercise. Patient missed initial follow up with Dr. [name]; therapist strongly recommend patient call to follow up and provided patient number. Remainder of session spent working on gait and LE exercises to break out of extensor synergy. Patient fatigues quickly but demonstrates excellent effort in session. Continue POC as able.   OBJECTIVE IMPAIRMENTS: Abnormal gait, decreased activity tolerance, decreased balance, decreased endurance, decreased mobility, difficulty walking, decreased ROM, decreased strength, increased muscle spasms, impaired flexibility, impaired sensation, impaired UE functional use, and  pain.   ACTIVITY LIMITATIONS: carrying, bending, standing, transfers, reach over head, and locomotion level  PARTICIPATION LIMITATIONS: cleaning, laundry, community activity, occupation, and yard work  PERSONAL FACTORS: Past/current experiences, Time since onset of injury/illness/exacerbation, and 1-2 comorbidities: see above  are also affecting patient's functional outcome.   REHAB POTENTIAL: Good  CLINICAL DECISION MAKING: Evolving/moderate complexity  EVALUATION COMPLEXITY: Moderate  PLAN:  PT FREQUENCY: 2x/week (1x on land, 1x in pool as possible)  PT DURATION: 8 weeks  PLANNED INTERVENTIONS: 97146- PT Re-evaluation, 97110-Therapeutic  exercises, 97530- Therapeutic activity, 97112- Neuromuscular re-education, 97535- Self Care, 64403- Manual therapy, [phone]- Gait training, [phone]- Aquatic Therapy, Patient/Family education, Dry Needling, and DME instructions  PLAN FOR NEXT SESSION: work on balance and strength, AD trial if insurance will allow (patient foot up brace already paid by insurance when?), work on flexion based activiites to minimize tone with SciFit warmup?, AFO trials (L and R versus R alone and trial [place]) with caregiver/patient education on how to don/doff  Spasticity management? Consider following up with PCP   Carmelia Bake, PT, DPT 02/12/2023, 12:45 PM

## 2023-02-16 ENCOUNTER — Ambulatory Visit: Payer: BC Managed Care – PPO | Admitting: Physical Therapy

## 2023-02-16 ENCOUNTER — Encounter: Payer: Self-pay | Admitting: Physical Medicine and Rehabilitation

## 2023-02-16 ENCOUNTER — Encounter (HOSPITAL_BASED_OUTPATIENT_CLINIC_OR_DEPARTMENT_OTHER): Payer: BC Managed Care – PPO | Admitting: Physical Medicine and Rehabilitation

## 2023-02-16 ENCOUNTER — Ambulatory Visit: Payer: BC Managed Care – PPO | Admitting: Occupational Therapy

## 2023-02-16 ENCOUNTER — Encounter: Payer: Self-pay | Admitting: Physical Therapy

## 2023-02-16 ENCOUNTER — Telehealth: Payer: Self-pay

## 2023-02-16 VITALS — BP 142/92 | HR 74 | Ht 73.0 in | Wt 276.0 lb

## 2023-02-16 DIAGNOSIS — G8222 Paraplegia, incomplete: Secondary | ICD-10-CM

## 2023-02-16 DIAGNOSIS — M6281 Muscle weakness (generalized): Secondary | ICD-10-CM

## 2023-02-16 DIAGNOSIS — R252 Cramp and spasm: Secondary | ICD-10-CM | POA: Insufficient documentation

## 2023-02-16 DIAGNOSIS — R2681 Unsteadiness on feet: Secondary | ICD-10-CM

## 2023-02-16 DIAGNOSIS — R278 Other lack of coordination: Secondary | ICD-10-CM | POA: Diagnosis not present

## 2023-02-16 DIAGNOSIS — R269 Unspecified abnormalities of gait and mobility: Secondary | ICD-10-CM

## 2023-02-16 MED ORDER — TIZANIDINE HCL 4 MG PO TABS: 4.0000 mg | ORAL_TABLET | Freq: Four times a day (QID) | ORAL | 5 refills | Status: DC

## 2023-02-16 MED ORDER — BACLOFEN 10 MG PO TABS: 10.0000 mg | ORAL_TABLET | Freq: Four times a day (QID) | ORAL | 5 refills | Status: AC

## 2023-02-16 MED ORDER — APIXABAN 2.5 MG PO TABS: 2.5000 mg | ORAL_TABLET | Freq: Two times a day (BID) | ORAL | 0 refills | Status: DC

## 2023-02-16 NOTE — Patient Instructions (Signed)
Patient is a 42 yr old male with hx of T7 thoracic SCI- admitted to CIR in 12/2022- has neurogenic bowel and bladder, also has HTN, DM2; and spasticity- Here for hospital f/u on SCI   Continue Eliquis for 1 more month.  Since not walking 150 ft 2-3x/day- will do a total of 3 months-    2.  Dr Yevette Edwards switched him fro, Oxy to [employer] - and getting refills from [facility] - federal law means he needs to continue the Norco.    3. When took baclofen 10 mg wasn't enough- in last month-  Baclofen 10 mg QID/4x/day   4. Zanaflex-  will increase to 4 mg 4x/day- don't increase on same day as Baclofen so we can tell if side effects we now fro what.    5. Patient to be seen by Dr Shearon Stalls for eval of botox of LE's- esp R knee.    6.  Suggest trigger point injections- of L rhomboids/lats- in 2-4 weeks. Will put on wait list.    7. F/U in 3 months with me; Dr Shearon Stalls ~ 6 weeks; and wait list for TrP injections.

## 2023-02-16 NOTE — Telephone Encounter (Signed)
Patient has requested an antibiotic for tooth pain.  He had call to reschedule his dental appointment. Patient has been advised to call and reschedule the appointment today.   If granted please send to [facility] on [address]   Call back phone [phone].

## 2023-02-16 NOTE — Therapy (Unsigned)
OUTPATIENT OCCUPATIONAL THERAPY NEURO TREATMENT  Patient Name: Mario [name] MRN: [id number] DOB:1980/05/15, 42 y.o., male Today's Date: 02/16/2023  PCP: Clearnce Hasten, PA-C  REFERRING PROVIDER: [name], PA-C  END OF SESSION:  OT End of Session - 02/16/23 0915     Visit Number 6    Number of Visits 12    Date for OT Re-Evaluation 03/20/23    Authorization Type BCBS: VL combined with pt, ot, st: 60    OT Start Time 0930    OT Stop Time 1015    OT Time Calculation (min) 45 min    Equipment Utilized During Treatment BSC, testing material    Activity Tolerance Patient tolerated treatment well    Behavior During Therapy [facility] for tasks assessed/performed                Past Medical History:  Diagnosis Date   Bronchitis    Diabetes (HCC)    Diverticulitis 10/2015   Hypertension    Past Surgical History:  Procedure Laterality Date   LUMBAR LAMINECTOMY/DECOMPRESSION MICRODISCECTOMY N/A 12/16/2022   Procedure: THORACIC DECOMPRESSION;  Surgeon: Estill Bamberg, MD;  Location: [facility] OR;  Service: Orthopedics;  Laterality: N/A;   Patient Active Problem List   Diagnosis Date Noted   Spinal cord injury, thoracic region (HCC) 01/08/2023   Diabetes (HCC) 01/08/2023   Adjustment disorder with mixed anxiety and depressed mood 12/31/2022   Incomplete paraplegia (HCC) 12/29/2022   Thoracic myelopathy 12/15/2022    ONSET DATE: 01/09/2023  REFERRING DIAG: G82.22 (ICD-10-CM) - Paraplegia, incomplete  THERAPY DIAG:  Other lack of coordination  Muscle weakness (generalized)  Unsteadiness on feet  Rationale for Evaluation and Treatment: Rehabilitation  SUBJECTIVE:   SUBJECTIVE STATEMENT: Pt states he is feeling good and the blue putty is going good too. Pt reports no recent falls or changes to medication.  Pt accompanied by: self  PERTINENT HISTORY:  PMHx: T2DM, HTN, back pain who started developing balance issues, falls and numbness from waist down.  He was seen  by MD with plans for surgery however started developing worsening of pain with increase in numbness and BLE weakness with difficulty walking and was admitted for workup.  Underwent emergent T7 to T10 laminectomy with bilateral partial facetectomy and spinal cord decompression on 08/27.  Postop had some improvement in weakness but continued to be limited by RLE greater than LLE weakness.   PRECAUTIONS: None and Other: Back brace (as needed)  WEIGHT BEARING RESTRICTIONS: Yes - pt reports 5-10 lbs lifting  PAIN:  Are you having pain?  Yes: NPRS scale: 4/10 Pain location: back (had problems Thurs/Fri) Pain description: aching Aggravating factors: maybe from sleep position, when he first stands up. Relieving factors: getting moving  FALLS: Has patient fallen in last 6 months? Yes. Number of falls 6-7 times: 3 times from bed in sleep, 1x at [place], in the bedroom ~ 2+ times with legs giving out, prior to surgery  LIVING ENVIRONMENT: Lives with: lives with their family, lives with their spouse, and lives with their sons (19 yo/15yo) Lives in: apartment/townhouse Stairs: Yes: Internal: 13 steps; on right going up Has following equipment at home: Quad cane large base, Environmental consultant - 2 wheeled, and bed side commode Pt has generally been staying upstairs in his bedroom with family bringing him food etc.  This is only the 2nd time he has come down the stairs since coming home.  PLOF: Independent with basic ADLs - was eventually getting help with socks and shoes  prior to emergent surgery as he was waiting for elective surgery and having more difficulty with ADLs. 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  OBJECTIVE:  Note: Objective measures were completed at Evaluation unless otherwise noted.  HAND DOMINANCE: Right  ADLs: Overall ADLs: Assistance from spouse with several aspects of ADLS (especially bathing and LB). Transfers/ambulation related  to ADLs: SBA - using walker at home  Eating: Ind Grooming: Ind UB Dressing: Ind LB Dressing: Using grabbers a lot, wife is helping with Socks and shoes. Toileting: BSC s/p lack of pain meds last week  Bathing: Wife is doing it mostly.  He has stood at the sink a couple of times since returning home. Tub Shower transfers: NA (not performing) - only sponge bathing Equipment: bed side commode - in the bedroom with pt Sits on side of bed or in a chair throughout the day at home  IADLs: Hobbies: Pt reports enjoying bowling prior to injury. Shopping: Wife Light housekeeping: Wife Meal Prep: Wife Community mobility: Drs appts only at this time Medication management: Ind (medicine bottles) Financial management: Trying to help - has short term disability for job and wife had a stroke and is not back to work since 2023 Handwriting: No changes by reports   MOBILITY STATUS: Needs Assist: RW all the time  POSTURE COMMENTS:  rounded shoulders and forward head  ACTIVITY TOLERANCE: Activity tolerance: Fair  FUNCTIONAL OUTCOME MEASURES:  Modified Barthel Index of ADLS: 62/100    UPPER EXTREMITY ROM:    Active ROM Right eval Left eval  Shoulder flexion [facility]  Shoulder abduction    Shoulder adduction    Shoulder extension    Shoulder internal rotation    Shoulder external rotation    Elbow flexion    Elbow extension    Wrist flexion    Wrist extension    Wrist ulnar deviation    Wrist radial deviation    Wrist pronation    Wrist supination    (Blank rows = not tested)  UPPER EXTREMITY MMT:     MMT Right eval Left eval  Shoulder flexion 4 4  Shoulder abduction    Shoulder adduction    Shoulder extension    Shoulder internal rotation    Shoulder external rotation    Middle trapezius    Lower trapezius    Elbow flexion    Elbow extension    Wrist flexion    Wrist extension    Wrist ulnar deviation    Wrist radial deviation    Wrist pronation    Wrist supination     (Blank rows = not tested)  HAND FUNCTION: Grip strength: Right: 74.0, 76.7, 66.5 lbs; Left: 67.9, 59.0, 57.7  lbs Average: Right 72.4 lbs; Left 61.5 lbs  02/16/23 Right: 73.6, 82.0, 84.9 Left 76.9, 81.5, 76.4 Average:   COORDINATION: Box and Blocks:  Right 33 blocks, Left 39 blocks  02/16/23 Right: 40 blocks Left 40 blocks  SENSATION: WFL - BUEs Across abdomen and thighs - tingling sensations ie) especially when the dog [employer]) rubs on him - goes "0-1000" From OT hospital DC Central sensation comments: numbness/tingling in BLE, reports most ingling in bottom of feet/toes Light Touch Impaired Details: Impaired RLE; Impaired LLE Hot/Cold: Appears Intact Proprioception: Appears Intact Stereognosis: Not tested Coordination: Gross Motor Movements are Fluid and Coordinated: Yes Fine Motor Movements are Fluid and Coordinated: Yes Coordination and Movement Description: BUE WFL, BLE impaired  EDEMA: na  MUSCLE TONE: WFL  COGNITION: Overall cognitive status: Within functional limits for tasks assessed BIMS at hospital DC last month - 15/15  VISION: Subjective report: No concerns/changes from baseline Baseline vision: Wears contacts Visual history: NA  VISION ASSESSMENT: Not tested  PERCEPTION: Not tested  PRAXIS: Not tested  OBSERVATIONS: Pt arrived in facility transport chair with ability to stand at parallel bars demonstrated in OT evaluation but pt remains holding on at all times for support and has limited activity tolerance in standing.  Pt. appears stronger than he performs and moves slowly when performing table top tasks.     TODAY'S TREATMENT:                                                                                                                               Therapeutic Activities Theraputty (blue)  - Pt reported putty HEP was easy when using green putty, therefore progressed to blue putty (increased resistance) to improve BUE strengthening,  coordination. Reviewed therputty HEP. Pt demo'd understanding and tolerated task well.  Removing large pegs with gripper - Black spring, 55 lb. - BUE, 25 pegs, 1 set seated and 1 set standing (with gait belt, raised surface to improve standing posture) - to increase BUE strengthening, grip strength, activity tolerance, dynamic sitting/standing balance. Pt removed 25 pegs with L hand then R hand, reaching across midline to place pegs in bucket. Pt c/o shoulder "tension," therefore pt completed shoulder squeezes and shoulder flexion stretches - 5 reps each - to decrease BUE shoulder pain and tightness between sets. Pt replaced pegs after each set to improve RUE coordination, dexterity. Pt tolerated 1 min, 41 seconds standing with gait belt, CGA for standing set with RUE. Pt progressed to tolerating 2 min, 56 seconds standing with gait belt, CGA for standing set with LUE.  Self-care LB dressing task of donning shoes - to increase ind with ADL tasks, improve understanding of A/E - Pt donned shoes ind with increased time. OT provided education on strategies to don shoes more efficiently for energy conservation and to decrease pain. OT provided education on option to use stool under feet to raise surface, decrease pain, increase ind with dressing tasks. Pt also trialed donning shoes with reacher following OT education. Pt verbalized preference to lean forward and use hands without reacher though preferred the stool support under feet.  PATIENT EDUCATION: Education details: activity tolerance, basket/bag for walker or wearing clothing with pockets to increase safety with walker, proximal stability for distal mobility, upright seated/standing posture, reduce fall risk by placing stool against stable object to prevent stool from slipping when donning shoes. Person educated: Patient Education method: Explanation, Demonstration, and Verbal cues Education comprehension: verbalized understanding, returned  demonstration, and needs further education  HOME EXERCISE PROGRAM: 02/05/23 - theraputty (green) HEP - access code ([email or web address]) 02/06/23 - theraband (black) HEP - BUE shoulder - access code [facility]) 02/09/23 - coordination HEP - handout provided, see pt instructions 02/05/23 -  progressed theraputty (blue) HEP - access code (same as above: [email or web address])   GOALS: Goals reviewed with patient? Yes 02/05/23  SHORT TERM GOALS: Target date: 02/23/23  Patient will demonstrate updated B HEP with 25% verbal cues or less for proper execution. Baseline: New to outpt OT Goal status: IN Progress  2.  Patient will demonstrate at least 10 lbs improvement in B grip strength as needed to open jars and other containers. Baseline: Average: Right 72.4 lbs; Left 61.5 lbs Goal status: IN Progress  3.  Pt will be able to place at least an additional 5+ blocks using BUEs with completion of Box and Blocks test. Baseline: Right 33 blocks, Left 39 blocks Goal status: IN Progress  4.  Pt will be independent with toileting activities in the bathroom again. Baseline: Using BSC in bedroom. Goal status: MET  5. Pt will be independent with LB dressing including socks and shoes.  Baseline: Wife is assisting  02/12/24 - Pt ind donned shoes.  Goal Status: MET  LONG TERM GOALS: Target date: 03/20/23  Patient will demonstrate updated B HEP with visual printouts only for proper execution. Baseline: New to outpt OT Goal status: IN Progress  2.  Patient will demonstrate improved independence with all ADL skills to score >80/100 on the Modified Barthel Index of ADLs. Baseline: Score 62 Goal status: MET 02/16/23 - 84/100  3.  Patient will demonstrate standing balance/tolerance x 5+ minutes for simple meal prep/home management tasks to help at home. Baseline: Remaining upstairs only Goal status: IN Progress  ASSESSMENT:  CLINICAL IMPRESSION: Pt met 1 STG LB dressing goal today by donning shoes ind. Pt tolerated  tasks well and is continuing to progress with standing tolerance and BUE strengthening. Pt continues to require CGA for functional standing tasks d/t decreased balance and standing tolerance. Pt would continue to benefit from skilled OT services in the outpatient setting to work on impairments as noted below to help pt return to PLOF as able.    PERFORMANCE DEFICITS: in functional skills including ADLs, IADLs, coordination, dexterity, ROM, strength, pain, Fine motor control, Gross motor control, mobility, balance, endurance, decreased knowledge of use of DME, and UE functional use, cognitive skills including energy/drive, and psychosocial skills including coping strategies, environmental adaptation, and routines and behaviors.   IMPAIRMENTS: are limiting patient from ADLs, IADLs, work, leisure, and social participation.   CO-MORBIDITIES: may have co-morbidities  that affects occupational performance. Patient will benefit from skilled OT to address above impairments and improve overall function.  REHAB POTENTIAL: Excellent  PLAN:  OT FREQUENCY: 1-2x/week (will need more PT than OT)  OT DURATION: 6 weeks  PLANNED INTERVENTIONS: self care/ADL training, therapeutic exercise, therapeutic activity, neuromuscular re-education, balance training, functional mobility training, aquatic therapy, patient/family education, energy conservation, coping strategies training, and DME and/or AE instructions  RECOMMENDED OTHER SERVICES: PT evaluation rescheduled later this week  CONSULTED AND AGREED WITH PLAN OF CARE: Patient  PLAN FOR NEXT SESSION:  Continuing education and v/c for upright standing/seated posture Incorporate standing with UE for ADLs Review strategies for ADL bathing, toileting tasks to increase ind with A/E PRN - ask pt about progress  Progress Blaze Pods to vertical surface (mirror) as tolerated by pt  Progress UE HEP - high level coordination  Victorino Sparrow, OT 02/16/2023, 11:27  AM

## 2023-02-16 NOTE — Telephone Encounter (Signed)
Called pt; left vm

## 2023-02-16 NOTE — Telephone Encounter (Signed)
I dont write for Antibiotic-s needs ot call his PCP- thanks- ML

## 2023-02-16 NOTE — Progress Notes (Signed)
Subjective:    Patient ID: Mario [name], male    DOB: 03-Apr-1981, 42 y.o.   MRN: [id number]  HPI  Patient is a 42 yr old male with hx of T7 thoracic SCI- ASIA D s/p fusion- admitted to CIR in 12/2022- has neurogenic bowel and bladder, also has HTN, DM2; and spasticity- Here for hospital f/u on SCI   Thursday night, fell asleep on side- had horrible back pain as a result. Also had to sneeze- causes so much pain.   Legs also more tight than when left hospital.    Had tooth fx- had appt- needs to reschedule- went to wrong office.    Pharmacy didn't have zanaflex- so has baclofen, not zanaflex-  Has sometimes taken 2 baclofen at a time- worked pretty good- usually at at bedtime to get through night til AM.  Zanaflex 4 mg BID and 6 mg at bedtime.     Has 2 Norco left-   Is done with Eliquis Rx he has at home- going up and down stairs and walked "two laps".  Walking 100-150 ft- at a time when walks-   Not doing a whole lot- except a festival this weekend.  Does up and down steps in home, but not walking outside the home.   Not using Norco too often, usually using Tylenol, but sometimes- Dr Yevette Edwards gave him refills.   Still in therapy- PT and OT Starts aquatic therapy Thursday .   Spasticity- worse in AM- when first wakes up and at night, spasming a lot overnight. If moves, spasms just go off.  Legs shaking and jerking/jumping a lot.   Wearing R foot up brace, but not all the time.    Pain Inventory Average Pain 7 Pain Right Now 4 My pain is aching  In the last 24 hours, has pain interfered with the following? General activity 0 Relation with others 0 Enjoyment of life 0 What TIME of day is your pain at its worst? night Sleep (in general) Fair  Pain is worse with: bending Pain improves with: therapy/exercise and medication Relief from Meds: 7  use a walker ability to climb steps?  yes do you drive?  no  Do you have any goals in this area?   yes  numbness spasms  Hospital f/u  Hospital f/u    Family History  Problem Relation Age of Onset   Hyperlipidemia Mother    Diabetes Mother    Heart disease Father    Social History   Socioeconomic History   Marital status: Married    Spouse name: Not on file   Number of children: Not on file   Years of education: Not on file   Highest education level: Not on file  Occupational History   Not on file  Tobacco Use   Smoking status: Every Day    Current packs/day: 0.30    Types: Cigarettes   Smokeless tobacco: Never  Vaping Use   Vaping status: Never Used  Substance and Sexual Activity   Alcohol use: Yes    Comment: occasionally   Drug use: Not Currently    Types: Cocaine   Sexual activity: Not on file  Other Topics Concern   Not on file  Social History Narrative   ** Merged History Encounter **       Social Determinants of Health   Financial Resource Strain: Not on file  Food Insecurity: No Food Insecurity (12/15/2022)   Hunger Vital Sign    Worried About Running Out  of Food in the Last Year: Never true    Ran Out of Food in the Last Year: Never true  Transportation Needs: No Transportation Needs (12/15/2022)   PRAPARE - Administrator, Civil Service (Medical): No    Lack of Transportation (Non-Medical): No  Physical Activity: Not on file  Stress: Not on file  Social Connections: Not on file   Past Surgical History:  Procedure Laterality Date   LUMBAR LAMINECTOMY/DECOMPRESSION MICRODISCECTOMY N/A 12/16/2022   Procedure: THORACIC DECOMPRESSION;  Surgeon: Estill Bamberg, MD;  Location: [facility] OR;  Service: Orthopedics;  Laterality: N/A;   Past Medical History:  Diagnosis Date   Bronchitis    Diabetes (HCC)    Diverticulitis 10/2015   Hypertension    BP (!) 142/92   Pulse 74   Ht 6\' 1"  (1.854 m)   Wt 276 lb (125.2 kg)   SpO2 96%   BMI 36.41 kg/m   Opioid Risk Score:   Fall Risk Score:  `1  Depression screen [facility] 2/9     02/16/2023    11:14 AM  Depression screen PHQ 2/9  Decreased Interest 0  Down, Depressed, Hopeless 0  PHQ - 2 Score 0  Altered sleeping 1  Tired, decreased energy 1  Change in appetite 1  Feeling bad or failure about yourself  0  Trouble concentrating 0  Moving slowly or fidgety/restless 0  Suicidal thoughts 0  PHQ-9 Score 3  Difficult doing work/chores Not difficult at all    Review of Systems  Musculoskeletal:  Positive for back pain.  Neurological:  Positive for numbness.  All other systems reviewed and are negative.     Objective:   Physical Exam  Awake, alert, appropriate, using RW ot walk; wife accompanies pt, NAD  RLE_ HF/KE 5-/5; DF 4/5 and PF 5-/5 LLE_ HF 4+/5; KE/KF 5-/5 and DF/PF 5-/5 Lacking 5-10 degrees in R knee- of knee extension- 5 degrees in L knee  Neuro: Reduced sensation to LT- improved, but still reduced compared to intact above level of lesion Still reduces at T8 and gets worse slightly in legs B/L MAS of 1+ to 2 in LE's- B/L more in knees R>L Clonus 5-6 beats on RLE and 3 beats LLE  Gait-  Decreased toe elevation on R foot with gait Slightly narrow base of support Holding onto walker more than expected- a lot of UB support Not full knee extension A few beats of clonus RLE noted with gait.   TTP in rhomboids on L c/w Trp      Assessment & Plan:   Patient is a 42 yr old male with hx of T7 thoracic SCI- admitted to CIR in 12/2022- has neurogenic bowel and bladder, also has HTN, DM2; and spasticity- Here for hospital f/u on SCI   Continue Eliquis for 1 more month.  Since not walking 150 ft 2-3x/day- will do a total of 3 months-    2.  Dr Yevette Edwards switched him fro, Oxy to [employer] - and getting refills from Ortho spine - federal law means he needs to continue the Norco.    3. When took baclofen 10 mg wasn't enough- in last month-  Baclofen 10 mg QID/4x/day   4. Zanaflex-  will increase to 4 mg 4x/day- don't increase on same day as Baclofen so we can  tell if side effects we now fro what.    5. Patient to be seen by Dr Shearon Stalls for eval of botox of LE's- esp R knee.  6.  Suggest trigger point injections- of L rhomboids/lats- in 2-4 weeks. Will put on wait list.    7. F/U in 3 months with me; Dr Shearon Stalls ~ 6 weeks; and wait list for TrP injections. Double appt- SCI   I spent a total of  34  minutes on total care today- >50% coordination of care- due to spasticity, SCI management- DVT prophylaxis- and d/w pt about Botox.

## 2023-02-16 NOTE — Therapy (Signed)
OUTPATIENT PHYSICAL THERAPY NEURO TREATMENT   Patient Name: Mario [name] MRN: [id number] DOB:July 12, 1980, 42 y.o., male Today's Date: 02/16/2023   PCP: Clearnce Hasten, PA-C REFERRING PROVIDER: [name], PA-C  END OF SESSION:  PT End of Session - 02/16/23 0853     Visit Number 6    Number of Visits 17    Date for PT Re-Evaluation 04/09/23    Authorization Type [employer] [place]    PT Start Time [phone]    PT Stop Time 0930    PT Time Calculation (min) 40 min    Equipment Utilized During Treatment Gait belt    Activity Tolerance Patient tolerated treatment well    Behavior During Therapy [facility] for tasks assessed/performed             Past Medical History:  Diagnosis Date   Bronchitis    Diabetes (HCC)    Diverticulitis 10/2015   Hypertension    Past Surgical History:  Procedure Laterality Date   LUMBAR LAMINECTOMY/DECOMPRESSION MICRODISCECTOMY N/A 12/16/2022   Procedure: THORACIC DECOMPRESSION;  Surgeon: Estill Bamberg, MD;  Location: [facility] OR;  Service: Orthopedics;  Laterality: N/A;   Patient Active Problem List   Diagnosis Date Noted   Spinal cord injury, thoracic region (HCC) 01/08/2023   Diabetes (HCC) 01/08/2023   Adjustment disorder with mixed anxiety and depressed mood 12/31/2022   Incomplete paraplegia (HCC) 12/29/2022   Thoracic myelopathy 12/15/2022    ONSET DATE: 01/09/2023 (referral)  REFERRING DIAG: G82.22 (ICD-10-CM) - Paraplegia, incomplete M47.14 (ICD-10-CM) - Other spondylosis with myelopathy, thoracic region  THERAPY DIAG:  Unsteadiness on feet  Muscle weakness (generalized)  Abnormality of gait and mobility  Rationale for Evaluation and Treatment: Rehabilitation  SUBJECTIVE:                                                                                                                                                                                             SUBJECTIVE STATEMENT: Patient arrives to session with 2WW. Patient  scheduled to follow up with Dr. [name] today. Denies falls/near falls. Patient reports that he did have more low back pain over back.   Pt accompanied by: self and significant other - [name]  PERTINENT HISTORY: thoracic myelopathy, T7 to T10 laminectomy with bilateral partial facetectomy and spinal cord decompression on 08/27  PAIN:  Are you having pain? Yes: NPRS scale: 4-5/10 Pain location: right ankle and low back pain Pain description: brief spasms Aggravating factors: unsure just happen when I am sleeping Relieving factors: medication  PRECAUTIONS: Back and Fall  RED FLAGS: None   WEIGHT BEARING RESTRICTIONS: Yes cannot lift over 10  lbs  FALLS: Has patient fallen in last 6 months? Yes. 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  DIAGNOSTIC FINDINGS:  12/14/2022 MR THORACIC SPINE WO CONTRAST: IMPRESSION: 1. No spinal canal stenosis. 2. Mild neural foraminal narrowing on the right at L3-L4 and bilaterally at L4-L5. 3. Multilevel facet arthropathy, which is severe at L4-L5 and on the left at L5-S1, which can be a cause of back pain.  COGNITION: Overall cognitive status: Within functional limits for tasks assessed  TODAY'S TREATMENT:                                                                                                                               TherEx: LE on SciFit x 8 min to work  on minimizing LE spasticity during session at lvl 3.4   GAIT: Gait pattern: step through pattern, decreased hip/knee flexion- Right, decreased hip/knee flexion- Left, decreased ankle dorsiflexion- Right, decreased ankle dorsiflexion- Left, poor foot clearance- Right, and poor foot clearance- Left (increased hyperextension noted in today's session) Distance walked: various clinic distances, ~ 2 x 60 feet,~ 1 x 200 feet Assistive device utilized: Walker - 2 wheeled Level of assistance: SBA Comments: supination of LE and hyperextension of knees  Gait at single // bar with CGA:  Forward stepping at // bars 3 x 15 feet Backward stepping at // bars 3 x 15 feet Lateral stepping at // bars 3 x 15 bidirectionally   PATIENT EDUCATION: Education details: [place] HEP,  Discussed appointment with Dr. [name] and topics to bring up including ongoing LE spasticity and tightness Person educated: Patient Education method: Explanation and Handouts Education comprehension: verbalized understanding  HOME EXERCISE PROGRAM:   Do each exercise 1-2  times per day Do each exercise 10 repetitions Hold each exercise for 2 seconds to feel your location  AT SINK FIND YOUR MIDLINE POSITION AND PLACE FEET EQUAL DISTANCE FROM THE MIDLINE.  Try to find this position when standing still for activities.   Side to Side Shift: Moving your hips only (not shoulders): move weight onto your left leg, HOLD/FEEL.  Move back to equal weight on each leg, HOLD/FEEL. Move weight onto your right leg, HOLD/FEEL. Move back to equal weight on each leg, HOLD/FEEL. Repeat.  Start with both hands on sink, progress to right hand only, then no hands.  Moving Cones / Cups: With equal weight on each leg: Hold  on with one hand the first time, then progress to no hand supports. Move cups from one side of sink to the other. Place cups ~2" out of your reach, progress to 10" beyond reach.  Place one hand in middle of sink and reach with other hand.  Do both arms.  Then hover one hand and move cups with other hand.  Overhead/Upward Reaching: alternated reaching up to top cabinets or ceiling if no cabinets present. Keep equal weight on each leg. Start with one hand support on counter while other hand reaches and progress to no hand support with reaching.  ace one hand in middle of sink and  6.  Stepping:  Move items under cabinet out of your way. Step out with one foot and then bring it back; step again to side.  7. Marching at counter  GOALS: Goals reviewed with patient? Yes  SHORT TERM GOALS: Target date: 02/26/2023  Patient will demonstrate independence with initial HEP to continue to progress between physical therapy sessions.   Baseline: To be provided Goal status: INITIAL  2.  Patient will improve gait speed to 0.4 m/s or greater with LRAD to indicate an improvement from being a household ambulator to limited community ambulator.   Baseline: 0.3 m/s with 2WW + R foot up brace (SBA) Goal status: INITIAL  3.  Patient will improve their 5x Sit to Stand score to less than 37 seconds to demonstrate a decreased risk for falls and improved LE strength.   Baseline: 42.77 seconds with UE use Goal status: INITIAL  4.  Patient will improve TUG score to 75 seconds with LRAD to indicate a decreased risk of falls and demonstrate improved overall mobility.    Baseline: 85 seconds with 2WW Goal status: INITIAL  LONG TERM GOALS: Target date: 04/09/2023  Patient will report demonstrate independence with final HEP in order to maintain current gains and continue to progress after physical therapy discharge.   Baseline: To be provided Goal status: INITIAL  2.  Patient will improve gait speed to 0.5 m/s with LRAD to indicate improvement towards the level of community ambulator in order to participate more easily in activities outside of the home.   Baseline: 0.3 m/s with 2WW + R foot up brace (SBA) Goal status: INITIAL  3.  Patient will  improve their 5x Sit to Stand score to less than 34 seconds to demonstrate a decreased risk for falls and improved LE strength.   Baseline: 42.77 seconds with UE use Goal status: INITIAL  4.  Patient will improve TUG score to 60 seconds or less with LRAD to indicate a decreased risk of falls and demonstrate improved overall mobility.   Baseline: 85 seconds with 2WW Goal status: INITIAL  ASSESSMENT:  CLINICAL IMPRESSION: Session continue to be limited by extent of LE spasticity causing patient to rely strongly on UE to help clear limbs during gait. Patient to start aquatic therapy which will hopefully help progress POC. Patient would likely benefit from AFO if can manage LE to get to point of being able to don/doff. [place] improved hip and knee flexion through gait after bike warmup compared to gait pattern prior to intervention. Continue POC as able.   OBJECTIVE IMPAIRMENTS: Abnormal gait, decreased activity tolerance, decreased balance, decreased endurance, decreased mobility, difficulty walking, decreased ROM, decreased strength, increased muscle spasms, impaired flexibility, impaired sensation, impaired UE functional use, and pain.   ACTIVITY LIMITATIONS: carrying, bending, standing, transfers, reach over head, and locomotion level  PARTICIPATION LIMITATIONS: cleaning,  laundry, community activity, occupation, and yard work  PERSONAL FACTORS: Past/current experiences, Time since onset of injury/illness/exacerbation, and 1-2 comorbidities: see above  are also affecting patient's functional outcome.   REHAB POTENTIAL: Good  CLINICAL DECISION MAKING: Evolving/moderate complexity  EVALUATION COMPLEXITY: Moderate  PLAN:  PT FREQUENCY: 2x/week (1x on land, 1x in pool as possible)  PT DURATION: 8 weeks  PLANNED INTERVENTIONS: 97146- PT Re-evaluation, 97110-Therapeutic exercises, 97530- Therapeutic activity, 97112- Neuromuscular re-education, 97535- Self Care, 81829- Manual therapy,  [phone]- Gait training, [phone]- Aquatic Therapy, Patient/Family education, Dry Needling, and DME instructions  PLAN FOR NEXT SESSION: work on balance and strength, AD trial if insurance will allow (patient foot up brace already paid by insurance when?), work on flexion based activiites to minimize tone with SciFit warmup?, AFO trials (L and R versus R alone and trial [place]) with caregiver/patient education on how to don/doff Add to schedule land + pool with remaining visits in cert, how was follow up with Dr. [name], activities to promote LE flexion, check land goals   Aquatic: anything to work on promoting LE flexion and gait with reduced UE support  Carmelia Bake, PT, DPT 02/16/2023, 9:35 AM

## 2023-02-18 ENCOUNTER — Encounter: Payer: BC Managed Care – PPO | Admitting: Physical Medicine and Rehabilitation

## 2023-02-19 ENCOUNTER — Ambulatory Visit: Payer: BC Managed Care – PPO | Admitting: Physical Therapy

## 2023-02-23 ENCOUNTER — Ambulatory Visit: Payer: BC Managed Care – PPO | Admitting: Physical Medicine and Rehabilitation

## 2023-02-23 ENCOUNTER — Ambulatory Visit: Payer: BC Managed Care – PPO | Attending: *Deleted | Admitting: Physical Therapy

## 2023-02-23 ENCOUNTER — Ambulatory Visit: Payer: BC Managed Care – PPO | Admitting: Occupational Therapy

## 2023-02-23 ENCOUNTER — Encounter: Payer: Self-pay | Admitting: Physical Therapy

## 2023-02-23 VITALS — BP 151/84 | HR 81

## 2023-02-23 DIAGNOSIS — M6281 Muscle weakness (generalized): Secondary | ICD-10-CM | POA: Diagnosis present

## 2023-02-23 DIAGNOSIS — R29898 Other symptoms and signs involving the musculoskeletal system: Secondary | ICD-10-CM | POA: Insufficient documentation

## 2023-02-23 DIAGNOSIS — R278 Other lack of coordination: Secondary | ICD-10-CM | POA: Insufficient documentation

## 2023-02-23 DIAGNOSIS — R2681 Unsteadiness on feet: Secondary | ICD-10-CM

## 2023-02-23 DIAGNOSIS — R269 Unspecified abnormalities of gait and mobility: Secondary | ICD-10-CM | POA: Insufficient documentation

## 2023-02-23 DIAGNOSIS — R29818 Other symptoms and signs involving the nervous system: Secondary | ICD-10-CM | POA: Insufficient documentation

## 2023-02-23 NOTE — Therapy (Signed)
OUTPATIENT PHYSICAL THERAPY NEURO TREATMENT   Patient Name: Mario [name] MRN: [id number] DOB:10-28-1980, 42 y.o., male Today's Date: 02/23/2023   PCP: Clearnce Hasten, PA-C REFERRING PROVIDER: [name], PA-C  END OF SESSION:  PT End of Session - 02/23/23 0926     Visit Number 7    Number of Visits 17    Date for PT Re-Evaluation 04/09/23    Authorization Type Blue Cross [place]    PT Start Time 0802    PT Stop Time 0845    PT Time Calculation (min) 43 min    Equipment Utilized During Treatment Gait belt    Activity Tolerance Patient tolerated treatment well    Behavior During Therapy [facility] for tasks assessed/performed              Past Medical History:  Diagnosis Date   Bronchitis    Diabetes (HCC)    Diverticulitis 10/2015   Hypertension    Past Surgical History:  Procedure Laterality Date   LUMBAR LAMINECTOMY/DECOMPRESSION MICRODISCECTOMY N/A 12/16/2022   Procedure: THORACIC DECOMPRESSION;  Surgeon: Estill Bamberg, MD;  Location: [facility] OR;  Service: Orthopedics;  Laterality: N/A;   Patient Active Problem List   Diagnosis Date Noted   Abnormality of gait 02/16/2023   Spasticity 02/16/2023   Spinal cord injury, thoracic region [facility]) 01/08/2023   Diabetes (HCC) 01/08/2023   Adjustment disorder with mixed anxiety and depressed mood 12/31/2022   Incomplete paraplegia (HCC) 12/29/2022   Thoracic myelopathy 12/15/2022    ONSET DATE: 01/09/2023 (referral)  REFERRING DIAG: G82.22 (ICD-10-CM) - Paraplegia, incomplete M47.14 (ICD-10-CM) - Other spondylosis with myelopathy, thoracic region  THERAPY DIAG:  Abnormality of gait and mobility  Other symptoms and signs involving the nervous system  Muscle weakness (generalized)  Rationale for Evaluation and Treatment: Rehabilitation  SUBJECTIVE:                                                                                                                                                                                              SUBJECTIVE STATEMENT: Patient ambulating with RW - says he left his foot up brace in his coat pocket in the car;  pt reporting tightness in Lt lateral low back today - states his wife felt a knot in that area but it is not so bad now  Pt accompanied by: self and significant other - [name]  PERTINENT HISTORY: thoracic myelopathy, T7 to T10 laminectomy with bilateral partial facetectomy and spinal cord decompression on 08/27  PAIN: 02-23-23 --  pt reports more tightness and not true pain  Are you having pain? Yes: NPRS scale: 2/10 Pain  location: left lateral low back pain Pain description: brief spasms Aggravating factors: unsure just happen when I am sleeping Relieving factors: medication  PRECAUTIONS: Back and Fall  RED FLAGS: None   WEIGHT BEARING RESTRICTIONS: Yes cannot lift over 10 lbs  FALLS: Has patient fallen in last 6 months? Yes. 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  DIAGNOSTIC FINDINGS:  12/14/2022 MR THORACIC SPINE WO CONTRAST: IMPRESSION: 1. No spinal canal stenosis. 2. Mild neural foraminal narrowing on the right at L3-L4 and bilaterally at L4-L5. 3. Multilevel facet arthropathy, which is severe at L4-L5 and on the left at L5-S1, which can be a cause of back pain.  COGNITION: Overall cognitive status: Within functional limits for tasks assessed  TODAY'S  TREATMENT:  02-23-23                                                                                                                            TherEx: 5x sit to stand - score 29.03 secs with bil. UE support from standard chair  In modified quadruped position with UE support on kay bench - pt performed Rt hip extension 10 reps In seated position on mat - performed Rt hamstring stretches with Rt foot passively dorsiflexed by PT - contract/relax with 5 sec hold 10 reps for Rt hamstring stretching Rt knee flexion 10 reps in seated position Standing Rt gastroc stretch with use of wedge block (3" height) used - pt held stretch for 30 secs x 1 rep SciFit - level 3 -LE's only for reciprocal movement/strengthening x 5" (at end of session)  GAIT: Gait pattern: step through pattern, decreased hip/knee flexion- Right, decreased hip/knee flexion- Left, decreased ankle dorsiflexion- Right, decreased ankle dorsiflexion- Left, poor foot clearance- Right, and poor foot clearance- Left (increased hyperextension noted in today's session) Distance walked: various clinic distances, ~ 2 x 60 feet,~ 1 x 200 feet Assistive device utilized: Walker - 2 wheeled Level of assistance: SBA Comments: supination of LE and hyperextension of knees  Gait velocity:  26.03 secs with RW = .38 m/sec (pt not wearing foot up brace on RLE)   NeuroRe-ed: TUG score - 37.69 secs with RW Tall kneeling on mat with kay bench in front of pt for bil. UE support Pt performed mini squats 10 reps with UE support  PATIENT EDUCATION: Education details: [place] HEP,  Discussed appointment with Dr. [name] and topics to bring up  including ongoing LE spasticity and tightness Person educated: Patient Education method: Explanation and Handouts Education comprehension: verbalized understanding  HOME EXERCISE PROGRAM:   Do each exercise 1-2  times per day Do each exercise 10 repetitions Hold each exercise for 2 seconds to feel your  location  AT SINK FIND YOUR MIDLINE POSITION AND PLACE FEET EQUAL DISTANCE FROM THE MIDLINE.  Try to find this position when standing still for activities.   Side to Side Shift: Moving your hips only (not shoulders): move weight onto your left leg, HOLD/FEEL.  Move back to equal weight on each leg, HOLD/FEEL. Move weight onto your right leg, HOLD/FEEL. Move back to equal weight on each leg, HOLD/FEEL. Repeat.  Start with both hands on sink, progress to right hand only, then no hands.  Moving Cones / Cups: With equal weight on each leg: Hold on with one hand the first time, then progress to no hand supports. Move cups from one side of sink to the other. Place cups ~2" out of your reach, progress to 10" beyond reach.  Place one hand in middle of sink and reach with other hand. Do both arms.  Then hover one hand and move cups with other hand.  Overhead/Upward Reaching: alternated reaching up to top cabinets or ceiling if no cabinets present. Keep equal weight on each leg. Start with one hand support on counter while other hand reaches and progress to no hand support with reaching.  ace one hand in middle of sink and  6.  Stepping:  Move items under cabinet out of your way. Step out with one foot and then bring it back; step again to side.  7. Marching at counter  GOALS: Goals reviewed with patient? Yes  SHORT TERM GOALS: Target date: 02/26/2023  Patient will demonstrate independence with initial HEP to continue to progress between physical therapy sessions.   Baseline: To be provided Goal status: MET 02-23-23  2.  Patient will improve gait speed to 0.4 m/s or greater with LRAD to indicate an improvement from being a household ambulator to limited community ambulator.   Baseline: 0.3 m/s with 2WW + R foot up brace (SBA);  26.03 secs = .38 m/sec Goal status: Partially met 02-23-23  3.  Patient will improve their 5x Sit to Stand score to less than 37 seconds to demonstrate a decreased risk for falls  and improved LE strength.   Baseline: 42.77 seconds with UE use;  29.03 secs with bil. UE support from standard chair Goal status: MET 02-23-23  4.  Patient will improve TUG score to 75 seconds with LRAD to indicate a decreased risk of falls and demonstrate improved overall mobility.   Baseline: 85 seconds with 2WW;  37.69 secs with RW Goal status: MET 02-23-23  LONG TERM GOALS: Target date: 04/09/2023  Patient will report demonstrate independence with final HEP in order to maintain current gains and continue to progress after physical therapy discharge.   Baseline: To be provided Goal status: INITIAL  2.  Patient will improve gait speed to 0.5 m/s with LRAD to indicate improvement towards the level of community ambulator in order to participate more easily in activities outside of the home.   Baseline: 0.3 m/s with 2WW + R foot up brace (SBA) Goal status: INITIAL  3.  Patient will improve their 5x Sit to Stand score to less than 34 seconds to demonstrate a decreased risk for falls and improved LE strength.   Baseline: 42.77 seconds with UE use Goal  status: INITIAL  4.  Patient will improve TUG score to 60 seconds or less with LRAD to indicate a decreased risk of falls and demonstrate improved overall mobility.   Baseline: 85 seconds with 2WW Goal status: INITIAL  ASSESSMENT:  CLINICAL IMPRESSION: Pt has met STG's #1, 3 and 4:  pt ambulating to today's session with use of RW but without foot up brace (due to leaving it in coat pocket in car).  STG #2 partially met as gait speed has increased from .3 m/sec at eval to .38 m/sec in today's without use of foot up orthosis.  Spasticity in LE's (RLE > LLE) continues to limit mobility.  Cont with POC.  OBJECTIVE IMPAIRMENTS: Abnormal gait, decreased activity tolerance, decreased balance, decreased endurance, decreased mobility, difficulty walking, decreased ROM, decreased strength, increased muscle spasms, impaired flexibility, impaired  sensation, impaired UE functional use, and pain.   ACTIVITY LIMITATIONS: carrying, bending, standing, transfers, reach over head, and locomotion level  PARTICIPATION LIMITATIONS: cleaning, laundry, community activity, occupation, and yard work  PERSONAL FACTORS: Past/current experiences, Time since onset of injury/illness/exacerbation, and 1-2 comorbidities: see above  are also affecting patient's functional outcome.   REHAB POTENTIAL: Good  CLINICAL DECISION MAKING: Evolving/moderate complexity  EVALUATION COMPLEXITY: Moderate  PLAN:  PT FREQUENCY: 2x/week (1x on land, 1x in pool as possible)  PT DURATION: 8 weeks  PLANNED INTERVENTIONS: 97146- PT Re-evaluation, 97110-Therapeutic exercises, 97530- Therapeutic activity, 97112- Neuromuscular re-education, 97535- Self Care, [id number]- Manual therapy, [phone]- Gait training, [phone]- Aquatic Therapy, Patient/Family education, Dry Needling, and DME instructions  PLAN FOR NEXT SESSION: work on balance and strength, AD trial if insurance will allow (patient foot up brace already paid by insurance when?), work on flexion based activiites to minimize tone with SciFit warmup?, AFO trials (L and R versus R alone and trial [place]) with caregiver/patient education on how to don/doff Added on 4 additional weeks of PT (land/aquatic); activities to promote LE flexion   Aquatic: anything to work on promoting LE flexion and gait with reduced UE support  Kary Kos, PT 02/23/2023, 9:29 AM

## 2023-02-23 NOTE — Therapy (Unsigned)
OUTPATIENT OCCUPATIONAL THERAPY NEURO TREATMENT  Patient Name: Mario [name] MRN: [id number] DOB:May 22, 1980, 42 y.o., male Today's Date: 02/23/2023  PCP: Clearnce Hasten, PA-C  REFERRING PROVIDER: [name], PA-C  END OF SESSION:  OT End of Session - 02/23/23 0844     Visit Number 7    Number of Visits 12    Date for OT Re-Evaluation 03/20/23    Authorization Type BCBS: VL combined with pt, ot, st: 60    OT Start Time 0847    OT Stop Time 0930    OT Time Calculation (min) 43 min    Equipment Utilized During Treatment RW    Activity Tolerance Patient tolerated treatment well    Behavior During Therapy [facility] for tasks assessed/performed                Past Medical History:  Diagnosis Date   Bronchitis    Diabetes (HCC)    Diverticulitis 10/2015   Hypertension    Past Surgical History:  Procedure Laterality Date   LUMBAR LAMINECTOMY/DECOMPRESSION MICRODISCECTOMY N/A 12/16/2022   Procedure: THORACIC DECOMPRESSION;  Surgeon: Estill Bamberg, MD;  Location: [facility] OR;  Service: Orthopedics;  Laterality: N/A;   Patient Active Problem List   Diagnosis Date Noted   Abnormality of gait 02/16/2023   Spasticity 02/16/2023   Spinal cord injury, thoracic region (HCC) 01/08/2023   Diabetes (HCC) 01/08/2023   Adjustment disorder with mixed anxiety and depressed mood 12/31/2022   Incomplete paraplegia (HCC) 12/29/2022   Thoracic myelopathy 12/15/2022    ONSET DATE: 01/09/2023  REFERRING DIAG: G82.22 (ICD-10-CM) - Paraplegia, incomplete  THERAPY DIAG:  Muscle weakness (generalized)  Other symptoms and signs involving the musculoskeletal system  Unsteadiness on feet  Rationale for Evaluation and Treatment: Rehabilitation  SUBJECTIVE:   SUBJECTIVE STATEMENT: Pt states he is feeling good and the blue putty is going good too. Pt reports no recent falls or changes to medication.  He has a follow-up neuro appt after therapies today.   Pt accompanied by:  self  PERTINENT HISTORY:  PMHx: T2DM, HTN, back pain who started developing balance issues, falls and numbness from waist down.  He was seen by MD with plans for surgery however started developing worsening of pain with increase in numbness and BLE weakness with difficulty walking and was admitted for workup.  Underwent emergent T7 to T10 laminectomy with bilateral partial facetectomy and spinal cord decompression on 08/27.  Postop had some improvement in weakness but continued to be limited by RLE greater than LLE weakness.   PRECAUTIONS: None and Other: Back brace (as needed)  WEIGHT BEARING RESTRICTIONS: Yes - pt reports 5-10 lbs lifting  PAIN:  Are you having pain?  Yes: NPRS scale: 4/10 Pain location: back (had problems Thurs/Fri) Pain description: aching Aggravating factors: maybe from sleep position, it happens when he first stands up. Relieving factors: getting moving  FALLS: Has patient fallen in last 6 months? Yes. Number of falls 6-7 times: 3 times from bed in sleep, 1x at [place], in the bedroom ~ 2+ times with legs giving out, prior to surgery  LIVING ENVIRONMENT: Lives with: lives with their family, lives with their spouse, and lives with their sons (19 yo/15yo) Lives in: apartment/townhouse Stairs: Yes: Internal: 13 steps; on right going up Has following equipment at home: Quad cane large base, Environmental consultant - 2 wheeled, and bed side commode Pt has generally been staying upstairs in his bedroom with family bringing him food etc.  This is only the 2nd  time he has come down the stairs since coming home.  PLOF: Independent with basic ADLs - was eventually getting help with socks and shoes prior to emergent surgery as he was waiting for elective surgery and having more difficulty with ADLs. 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  OBJECTIVE:  Note: Objective measures were completed at Evaluation unless otherwise  noted.  HAND DOMINANCE: Right  ADLs: Overall ADLs: Assistance from spouse with several aspects of ADLS (especially bathing and LB). Transfers/ambulation related to ADLs: SBA - using walker at home  Eating: Ind Grooming: Ind UB Dressing: Ind LB Dressing: Using grabbers a lot, wife is helping with Socks and shoes. Toileting: BSC s/p lack of pain meds last week  Bathing: Wife is doing it mostly.  He has stood at the sink a couple of times since returning home. Tub Shower transfers: NA (not performing) - only sponge bathing Equipment: bed side commode - in the bedroom with pt Sits on side of bed or in a chair throughout the day at home  IADLs: Hobbies: Pt reports enjoying bowling prior to injury. Shopping: Wife Light housekeeping: Wife Meal Prep: Wife Community mobility: Drs appts only at this time Medication management: Ind (medicine bottles) Financial management: Trying to help - has short term disability for job and wife had a stroke and is not back to work since 2023 Handwriting: No changes by reports   MOBILITY STATUS: Needs Assist: RW all the time  POSTURE COMMENTS:  rounded shoulders and forward head  ACTIVITY TOLERANCE: Activity tolerance: Fair  FUNCTIONAL OUTCOME MEASURES:  Eval: Modified Barthel Index of ADLS: 62/100  02/16/23: Modified Barthel 84/100    UPPER EXTREMITY ROM:    Active ROM Right eval Left eval  Shoulder flexion [facility]  Shoulder abduction    Shoulder adduction    Shoulder extension    Shoulder internal rotation    Shoulder external rotation    Elbow flexion    Elbow extension    Wrist flexion    Wrist extension    Wrist ulnar deviation    Wrist radial deviation    Wrist pronation    Wrist supination    (Blank rows = not tested)  UPPER EXTREMITY MMT:     MMT Right eval Left eval  Shoulder flexion 4 4  Shoulder abduction    Shoulder adduction    Shoulder extension    Shoulder internal rotation    Shoulder external rotation     Middle trapezius    Lower trapezius    Elbow flexion    Elbow extension    Wrist flexion    Wrist extension    Wrist ulnar deviation    Wrist radial deviation    Wrist pronation    Wrist supination    (Blank rows = not tested)  HAND FUNCTION: Grip strength: Right: 74.0, 76.7, 66.5 lbs; Left: 67.9, 59.0, 57.7  lbs Average: Right 72.4 lbs; Left 61.5 lbs  02/16/23 Right: 73.6, 82.0, 84.9 Left 76.9, 81.5, 76.4 Average: Right 80.2 lbs Left 78.3 lbs  COORDINATION: Box and Blocks:  Right 33 blocks, Left 39 blocks  02/16/23 Right: 40 blocks Left 40 blocks  SENSATION: WFL - BUEs Across abdomen and thighs - tingling sensations ie) especially when the dog [employer]) rubs on him - goes "0-1000" From OT hospital DC [facility] sensation comments: numbness/tingling in BLE, reports most ingling in bottom of feet/toes Light Touch Impaired Details: Impaired RLE; Impaired LLE Hot/Cold:  Appears Intact Proprioception: Appears Intact Stereognosis: Not tested Coordination: Gross Motor Movements are Fluid and Coordinated: Yes Fine Motor Movements are Fluid and Coordinated: Yes Coordination and Movement Description: BUE WFL, BLE impaired  EDEMA: na  MUSCLE TONE: WFL  COGNITION: Overall cognitive status: Within functional limits for tasks assessed BIMS at hospital DC last month - 15/15  VISION: Subjective report: No concerns/changes from baseline Baseline vision: Wears contacts Visual history: NA  VISION ASSESSMENT: Not tested  PERCEPTION: Not tested  PRAXIS: Not tested  OBSERVATIONS: Pt arrived in facility transport chair with ability to stand at parallel bars demonstrated in OT evaluation but pt remains holding on at all times for support and has limited activity tolerance in standing.  Pt. appears stronger than he performs and moves slowly when performing table top tasks.     TODAY'S TREATMENT:                                                                                                                                Therapeutic Activities Reassessed OT goals for strength, coordination and ADL skills for update to goals as noted below. Improvements noted in strength (most improvement on L side) and coordination (most improvement on R side) and overall ADLs due to increased self-performance of tasks.   Self-care ADL transfer training trialled for tub transfer with [facility] as pt does not have access to shower bench although images shown to patient online.   OTR setup up BSC and block in front of commode to simulate edge of tub.  Pt is not able to back up all the way to the [facility] with block in the way and education is provided re: safety with sitting ie) reaching back to find one arm rest and then then other so he can lower himself back to the seat.  Also worked on picking up his legs over the block to lifting legs over to simulate getting in and out over the edge of a tub.   PATIENT EDUCATION: Education details: ADL tub transfer with [facility] Person educated: Patient Education method: Explanation, Demonstration, and Verbal cues Education comprehension: verbalized understanding, returned demonstration, and needs further education  HOME EXERCISE PROGRAM: 02/05/23 - theraputty (green) HEP - access code ([email or web address]) 02/06/23 - theraband (black) HEP - BUE shoulder - access code [facility]) 02/09/23 - coordination HEP - handout provided, see pt instructions 02/05/23 - progressed theraputty (blue) HEP - access code (same as above: [email or web address])   GOALS: Goals reviewed with patient? Yes 02/05/23  SHORT TERM GOALS: Target date: 02/23/23  Patient will demonstrate initial B HEP with 25% verbal cues or less for proper execution. Baseline: New to outpt OT Goal status: MET  2.  Patient will demonstrate at least 10 lbs improvement in B grip strength as needed to open jars and other containers. Baseline: Average: Right 72.4 lbs; Left 61.5 lbs Goal status: IN Progress - Right; MET -  Left 02/16/23: Average: Right 80.2 lbs Left 78.3  lbs  3.  Pt will be able to place at least an additional 5+ blocks using BUEs with completion of Box and Blocks test. Baseline: Right 33 blocks, Left 39 blocks Goal status: IN Progress - Left; MET - Right 02/16/23 Right: 40 blocks Left 40 blocks  4.  Pt will be independent with toileting activities in the bathroom again. Baseline: Using BSC in bedroom. Goal status: MET  5. Pt will be independent with LB dressing including socks and shoes.  Baseline: Wife is assisting  02/12/24 - Pt ind donned shoes.  Goal Status: MET  LONG TERM GOALS: Target date: 03/20/23  Patient will demonstrate updated B HEP with visual printouts only for proper execution. Baseline: New to outpt OT Goal status: IN Progress  2.  Patient will demonstrate improved independence with all ADL skills to score >80/100 on the Modified Barthel Index of ADLs. Baseline: Score 62 Goal status: MET 02/16/23 - 84/100  3.  Patient will demonstrate standing balance/tolerance x 5+ minutes for simple meal prep/home management tasks to help at home. Baseline: Remaining upstairs only Goal status: IN Progress 02/16/23 - Pt is going up and down stairs more often but still only sponge bathing due to not being able to get in/out of the tub to shower.  ASSESSMENT:  CLINICAL IMPRESSION: Pt met 3/5 STGs for HEP, and LB ADLs/toileting and met improved strength and coordination goals with 1 or 2 UEs (left for strength and right for coordination). Pt table to perform tub transfer in simulated set up to try and use BSC in tub due to no shower seat at this time. Pt will continue to benefit from skilled OT services in the outpatient setting to work on impairments as noted below to help pt return to PLOF as able.    PERFORMANCE DEFICITS: in functional skills including ADLs, IADLs, coordination, dexterity, ROM, strength, pain, Fine motor control, Gross motor control, mobility, balance, endurance,  decreased knowledge of use of DME, and UE functional use, cognitive skills including energy/drive, and psychosocial skills including coping strategies, environmental adaptation, and routines and behaviors.   IMPAIRMENTS: are limiting patient from ADLs, IADLs, work, leisure, and social participation.   CO-MORBIDITIES: may have co-morbidities  that affects occupational performance. Patient will benefit from skilled OT to address above impairments and improve overall function.  REHAB POTENTIAL: Excellent  PLAN:  OT FREQUENCY: 1-2x/week (will need more PT than OT)  OT DURATION: 6 weeks  PLANNED INTERVENTIONS: self care/ADL training, therapeutic exercise, therapeutic activity, neuromuscular re-education, balance training, functional mobility training, aquatic therapy, patient/family education, energy conservation, coping strategies training, and DME and/or AE instructions  RECOMMENDED OTHER SERVICES: PT evaluation rescheduled later this week  CONSULTED AND AGREED WITH PLAN OF CARE: Patient  PLAN FOR NEXT SESSION:  Continuing education and v/c for upright standing/seated posture Incorporate standing with UE for ADLs/theraband  Review strategies for ADL bathing, check re: BSC use  Progress Blaze Pods to vertical surface (mirror) as tolerated by pt  Progress UE HEP - high level coordination  Victorino Sparrow, OT 02/23/2023, 12:51 PM

## 2023-02-25 ENCOUNTER — Ambulatory Visit: Payer: BC Managed Care – PPO | Admitting: Occupational Therapy

## 2023-02-25 ENCOUNTER — Telehealth: Payer: Self-pay | Admitting: Occupational Therapy

## 2023-02-25 NOTE — Telephone Encounter (Signed)
OTR reached out to pt s/p no show appt this morning.  Pt reported that he had some bad spasms last night and his ankle was hurting this morning.  He and spouse are aware of aquatic appt tomorrow at 10:15 AM.

## 2023-02-26 ENCOUNTER — Ambulatory Visit: Payer: BC Managed Care – PPO | Admitting: Physical Therapy

## 2023-02-26 DIAGNOSIS — R278 Other lack of coordination: Secondary | ICD-10-CM

## 2023-02-26 DIAGNOSIS — R29818 Other symptoms and signs involving the nervous system: Secondary | ICD-10-CM

## 2023-02-26 DIAGNOSIS — R269 Unspecified abnormalities of gait and mobility: Secondary | ICD-10-CM | POA: Diagnosis not present

## 2023-02-26 DIAGNOSIS — R2681 Unsteadiness on feet: Secondary | ICD-10-CM

## 2023-02-26 DIAGNOSIS — R29898 Other symptoms and signs involving the musculoskeletal system: Secondary | ICD-10-CM

## 2023-02-26 DIAGNOSIS — M6281 Muscle weakness (generalized): Secondary | ICD-10-CM

## 2023-02-27 ENCOUNTER — Encounter: Payer: Self-pay | Admitting: Physical Therapy

## 2023-02-27 NOTE — Therapy (Signed)
OUTPATIENT PHYSICAL THERAPY NEURO TREATMENT   Patient Name: Severn Krotz MRN: [id number] DOB:03/26/1981, 42 y.o., male Today's Date: 02/27/2023   PCP: Clearnce Hasten, PA-C REFERRING PROVIDER: [name], PA-C  END OF SESSION:  PT End of Session - 02/26/23 1035     Visit Number 8    Number of Visits 17    Date for PT Re-Evaluation 04/09/23    Authorization Type [employer]    PT Start Time [phone]    PT Stop Time 1117    PT Time Calculation (min) 42 min    Equipment Utilized During Treatment Gait belt;Other (comment)   large aquatic barbell   Activity Tolerance Patient tolerated treatment well    Behavior During Therapy [facility] for tasks assessed/performed              Past Medical History:  Diagnosis Date   Bronchitis    Diabetes (HCC)    Diverticulitis 10/2015   Hypertension    Past Surgical History:  Procedure Laterality Date   LUMBAR LAMINECTOMY/DECOMPRESSION MICRODISCECTOMY N/A 12/16/2022   Procedure: THORACIC DECOMPRESSION;  Surgeon: Estill Bamberg, MD;  Location: [facility] OR;  Service: Orthopedics;  Laterality: N/A;   Patient Active Problem List   Diagnosis Date Noted   Abnormality of gait 02/16/2023   Spasticity 02/16/2023   Spinal cord injury, thoracic region [facility]) 01/08/2023   Diabetes (HCC) 01/08/2023   Adjustment disorder with mixed anxiety and depressed mood 12/31/2022   Incomplete paraplegia (HCC) 12/29/2022   Thoracic myelopathy 12/15/2022    ONSET DATE: 01/09/2023 (referral)  REFERRING DIAG: G82.22 (ICD-10-CM) - Paraplegia, incomplete M47.14 (ICD-10-CM) - Other spondylosis with myelopathy, thoracic region  THERAPY DIAG:  Muscle weakness (generalized)  Other symptoms and signs involving the musculoskeletal system  Unsteadiness on feet  Abnormality of gait and mobility  Other symptoms and signs involving the nervous system  Other lack of coordination  Rationale for Evaluation and Treatment: Rehabilitation  SUBJECTIVE:                                                                                                                                                                                              SUBJECTIVE STATEMENT: Patient ambulating with RW - arrived late to appt with wife stating he went to assisted living facility up the road thinking his session was there.  Pt accompanied by: self and significant other - [name]  PERTINENT HISTORY: thoracic myelopathy, T7 to T10 laminectomy with bilateral partial facetectomy and spinal cord decompression on 08/27  PAIN: 02-23-23 --  pt reports more tightness and not true pain  Are you having pain? Yes: NPRS  scale: 4/10 Pain location: left lateral low back pain and legs Pain description: brief spasms Aggravating factors: unsure Relieving factors: medication  PRECAUTIONS: Back and Fall  RED FLAGS: None   WEIGHT BEARING RESTRICTIONS: Yes cannot lift over 10 lbs  FALLS: Has patient fallen in last 6 months? Yes. 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  DIAGNOSTIC FINDINGS:  12/14/2022 MR THORACIC SPINE WO CONTRAST: IMPRESSION: 1. No spinal canal stenosis. 2. Mild neural foraminal narrowing on the right at L3-L4 and bilaterally at L4-L5. 3. Multilevel facet arthropathy, which is severe at L4-L5 and on the left at L5-S1, which can be a cause of back pain.  COGNITION: Overall  cognitive status: Within functional limits for tasks assessed  TODAY'S TREATMENT:  02-26-23                                                                                                                            Aquatic therapy at [facility] - pool temperature 90 degrees   Patient seen for aquatic therapy today.  Treatment took place in water 3.6-4.8 feet deep depending upon activity.  Patient entered and exited the pool via step-to pattern on stairs using bilateral rails at mod I level.   Exercises: Water walking warmup:  4 x18 ft forwards, backwards and laterally using large aquatic barbell, pt has decreased pace and repeated LOB using small stepping strategy to correct at SBA level (most often LOB occurred posteriorly) -STS from bench 2x10, second set completed w/o UE support to address immediate standing balance and LE strength -Seated bicycle 2x2 minutes -Seated flutter kicks progressing away from UE support 2x 1 minute -Walking march 4x18 ft using large aquatic barbell for stability and min cues to increase LE ROM and SLS challenge -Using large aquatic barbell patient performs squats 2x10 > lunging alt LE x15 each side, pt has most difficulty with this task due to coordination and split stance balance  Patient requires buoyancy of the water for support for reduced fall risk with gait training and balance exercises with SBA-CGA support. Exercises able to be performed safely in water without the risk of fall compared to those same exercises performed on land; viscosity of water needed for resistance for strengthening. Current of water provides perturbations for challenging static and dynamic balance.    PATIENT EDUCATION: Education details: [place] HEP,  Aquatic rationale. Person educated: Patient Education method: Production assistant, radio  comprehension: verbalized understanding  HOME EXERCISE PROGRAM:   Do each exercise 1-2  times per day Do each exercise 10  repetitions Hold each exercise for 2 seconds to feel your location  AT SINK FIND YOUR MIDLINE POSITION AND PLACE FEET EQUAL DISTANCE FROM THE MIDLINE.  Try to find this position when standing still for activities.   Side to Side Shift: Moving your hips only (not shoulders): move weight onto your left leg, HOLD/FEEL.  Move back to equal weight on each leg, HOLD/FEEL. Move weight onto your right leg, HOLD/FEEL. Move back to equal weight on each leg, HOLD/FEEL. Repeat.  Start with both hands on sink, progress to right hand only, then no hands.  Moving Cones / Cups: With equal weight on each leg: Hold on with one hand the first time, then progress to no hand supports. Move cups from one side of sink to the other. Place cups ~2" out of your reach, progress to 10" beyond reach.  Place one hand in middle of sink and reach with other hand. Do both arms.  Then hover one hand and move cups with other hand.  Overhead/Upward Reaching: alternated reaching up to top cabinets or ceiling if no cabinets present. Keep equal weight on each leg. Start with one hand support on counter while other hand reaches and progress to no hand support with reaching.  ace one hand in middle of sink and  6.  Stepping:  Move items under cabinet out of your way. Step out with one foot and then bring it back; step again to side.  7. Marching at counter  GOALS: Goals reviewed with patient? Yes  SHORT TERM GOALS: Target date: 02/26/2023  Patient will demonstrate independence with initial HEP to continue to progress between physical therapy sessions.   Baseline: To be provided Goal status: MET 02-23-23  2.  Patient will improve gait speed to 0.4 m/s or greater with LRAD to indicate an improvement from being a household ambulator to limited community ambulator.   Baseline: 0.3 m/s with 2WW + R foot up brace (SBA);  26.03 secs = .38 m/sec Goal status: Partially met 02-23-23  3.  Patient will improve their 5x Sit to Stand score to less  than 37 seconds to demonstrate a decreased risk for falls and improved LE strength.   Baseline: 42.77 seconds with UE use;  29.03 secs with bil. UE support from standard chair Goal status: MET 02-23-23  4.  Patient will improve TUG score to 75 seconds with LRAD to indicate a decreased risk of falls and demonstrate improved overall mobility.   Baseline: 85 seconds with 2WW;  37.69 secs with RW Goal status: MET 02-23-23  LONG TERM GOALS: Target date: 04/09/2023  Patient will report demonstrate independence with final HEP in order to maintain current gains and continue to progress after physical therapy discharge.   Baseline: To be provided Goal status: INITIAL  2.  Patient will improve gait speed to 0.5 m/s with LRAD to indicate improvement towards the level of community ambulator in order to participate more easily in activities outside of the home.   Baseline: 0.3 m/s with 2WW + R foot up brace (SBA) Goal status: INITIAL  3.  Patient will improve their 5x Sit to Stand score to less than 34 seconds to demonstrate a decreased risk for falls and improved LE strength.   Baseline: 42.77 seconds with UE use Goal status: INITIAL  4.  Patient will improve TUG score to 60 seconds or less  with LRAD to indicate a decreased risk of falls and demonstrate improved overall mobility.   Baseline: 85 seconds with 2WW Goal status: INITIAL  ASSESSMENT:  CLINICAL IMPRESSION: Patient arrived late to PT session, but was able to benefit from full time slot due to cancellation.  Patient was mildly limited by spasms during activity which mostly impacted pace of task completion.  He compensates with stepping strategy and was most challenged by split stance during lunging activity.  He continues to benefit from skilled PT to address coordination, strength, and dynamic safety.  OBJECTIVE IMPAIRMENTS: Abnormal gait, decreased activity tolerance, decreased balance, decreased endurance, decreased mobility, difficulty  walking, decreased ROM, decreased strength, increased muscle spasms, impaired flexibility, impaired sensation, impaired UE functional use, and pain.   ACTIVITY LIMITATIONS: carrying, bending, standing, transfers, reach over head, and locomotion level  PARTICIPATION LIMITATIONS: cleaning, laundry, community activity, occupation, and yard work  PERSONAL FACTORS: Past/current experiences, Time since onset of injury/illness/exacerbation, and 1-2 comorbidities: see above  are also affecting patient's functional outcome.   REHAB POTENTIAL: Good  CLINICAL DECISION MAKING: Evolving/moderate complexity  EVALUATION COMPLEXITY: Moderate  PLAN:  PT FREQUENCY: 2x/week (1x on land, 1x in pool as possible)  PT DURATION: 8 weeks  PLANNED INTERVENTIONS: 97146- PT Re-evaluation, 97110-Therapeutic exercises, 97530- Therapeutic activity, 97112- Neuromuscular re-education, 97535- Self Care, [id number]- Manual therapy, [phone]- Gait training, [phone]- Aquatic Therapy, Patient/Family education, Dry Needling, and DME instructions  PLAN FOR NEXT SESSION: work on balance and strength, AD trial if insurance will allow (patient foot up brace already paid by insurance when?), work on flexion based activiites to minimize tone with SciFit warmup?, AFO trials (L and R versus R alone and trial [place]) with caregiver/patient education on how to don/doff Added on 4 additional weeks of PT (land/aquatic); activities to promote LE flexion   Aquatic: anything to work on promoting LE flexion and gait with reduced UE support  Sadie Haber, PT, DPT 02/27/2023, 4:59 PM

## 2023-03-03 ENCOUNTER — Telehealth: Payer: Self-pay | Admitting: Occupational Therapy

## 2023-03-03 ENCOUNTER — Ambulatory Visit: Payer: BC Managed Care – PPO | Admitting: Occupational Therapy

## 2023-03-03 ENCOUNTER — Ambulatory Visit: Payer: BC Managed Care – PPO | Admitting: Physical Therapy

## 2023-03-03 NOTE — Telephone Encounter (Signed)
Pt no-showed for OT appointment today. Therefore, OT called pt's listed mobile number and spoke to pt's spouse, [name]. [name] reported that pt will not be coming to OT or PT sessions today because pt has toothache and will be going to a dentist appointment instead. [name] asked how to notify [facility] clinic if not coming if the clinic is not yet open. OT provided education to pt's spouse on no-show policy, recommended leaving voicemail if clinic is not yet open, and provided upcoming day/time of next OT appointment. Pt's spouse verbalized understanding of all.

## 2023-03-05 ENCOUNTER — Ambulatory Visit: Payer: BC Managed Care – PPO | Admitting: Occupational Therapy

## 2023-03-05 DIAGNOSIS — M6281 Muscle weakness (generalized): Secondary | ICD-10-CM

## 2023-03-05 DIAGNOSIS — R2681 Unsteadiness on feet: Secondary | ICD-10-CM

## 2023-03-05 DIAGNOSIS — R269 Unspecified abnormalities of gait and mobility: Secondary | ICD-10-CM | POA: Diagnosis not present

## 2023-03-05 DIAGNOSIS — R29818 Other symptoms and signs involving the nervous system: Secondary | ICD-10-CM

## 2023-03-05 NOTE — Therapy (Signed)
OUTPATIENT OCCUPATIONAL THERAPY NEURO TREATMENT  Patient Name: Mario [name] MRN: [id number] DOB:Jun 03, 1980, 42 y.o., male Today's Date: 03/05/2023  PCP: Clearnce Hasten, PA-C  REFERRING PROVIDER: [name], PA-C  END OF SESSION:  OT End of Session - 03/05/23 1309     Visit Number 8    Number of Visits 12    Date for OT Re-Evaluation 03/20/23    Authorization Type BCBS: VL combined with pt, ot, st: 60    OT Start Time 0805   Pt arrived late   OT Stop Time 0845    OT Time Calculation (min) 40 min    Activity Tolerance Patient tolerated treatment well    Behavior During Therapy [facility] for tasks assessed/performed                 Past Medical History:  Diagnosis Date   Bronchitis    Diabetes (HCC)    Diverticulitis 10/2015   Hypertension    Past Surgical History:  Procedure Laterality Date   LUMBAR LAMINECTOMY/DECOMPRESSION MICRODISCECTOMY N/A 12/16/2022   Procedure: THORACIC DECOMPRESSION;  Surgeon: Estill Bamberg, MD;  Location: [facility] OR;  Service: Orthopedics;  Laterality: N/A;   Patient Active Problem List   Diagnosis Date Noted   Abnormality of gait 02/16/2023   Spasticity 02/16/2023   Spinal cord injury, thoracic region (HCC) 01/08/2023   Diabetes (HCC) 01/08/2023   Adjustment disorder with mixed anxiety and depressed mood 12/31/2022   Incomplete paraplegia (HCC) 12/29/2022   Thoracic myelopathy 12/15/2022    ONSET DATE: 01/09/2023  REFERRING DIAG: G82.22 (ICD-10-CM) - Paraplegia, incomplete  THERAPY DIAG:  Muscle weakness (generalized)  Other symptoms and signs involving the nervous system  Unsteadiness on feet  Rationale for Evaluation and Treatment: Rehabilitation  SUBJECTIVE:   SUBJECTIVE STATEMENT:  Pt reported forgetting to take medication for pain today (tylenol). Pt reports things are going well.  Pt reports getting in and out of shower with supervision using tub transfer bench with extra time.  Pt accompanied by:  self  PERTINENT HISTORY:  PMHx: T2DM, HTN, back pain who started developing balance issues, falls and numbness from waist down.  He was seen by MD with plans for surgery however started developing worsening of pain with increase in numbness and BLE weakness with difficulty walking and was admitted for workup.  Underwent emergent T7 to T10 laminectomy with bilateral partial facetectomy and spinal cord decompression on 08/27.  Postop had some improvement in weakness but continued to be limited by RLE greater than LLE weakness.   PRECAUTIONS: None and Other: Back brace (as needed)  WEIGHT BEARING RESTRICTIONS: Yes - pt reports 5-10 lbs lifting  PAIN:  Are you having pain?  Yes: NPRS scale: 7/10 Pain location: left ankle  Pain description: shooting pain Aggravating factors: nagging/shooting pain when laying down/sitting, decreased pain when walking on it Relieving factors: getting moving  FALLS: Has patient fallen in last 6 months? Yes. Number of falls 6-7 times: 3 times from bed in sleep, 1x at [place], in the bedroom ~ 2+ times with legs giving out, prior to surgery  LIVING ENVIRONMENT: Lives with: lives with their family, lives with their spouse, and lives with their sons (19 yo/15yo) Lives in: apartment/townhouse Stairs: Yes: Internal: 13 steps; on right going up Has following equipment at home: Quad cane large base, Environmental consultant - 2 wheeled, and bed side commode Pt has generally been staying upstairs in his bedroom with family bringing him food etc.  This is only the 2nd time he  has come down the stairs since coming home.  PLOF: Independent with basic ADLs - was eventually getting help with socks and shoes prior to emergent surgery as he was waiting for elective surgery and having more difficulty with ADLs. 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  OBJECTIVE:  Note: Objective measures were completed at Evaluation unless otherwise  noted.  HAND DOMINANCE: Right  ADLs: Overall ADLs: Assistance from spouse with several aspects of ADLS (especially bathing and LB). Transfers/ambulation related to ADLs: SBA - using walker at home  Eating: Ind Grooming: Ind UB Dressing: Ind LB Dressing: Using grabbers a lot, wife is helping with Socks and shoes. Toileting: BSC s/p lack of pain meds last week  Bathing: Wife is doing it mostly.  He has stood at the sink a couple of times since returning home. Tub Shower transfers: NA (not performing) - only sponge bathing Equipment: bed side commode - in the bedroom with pt Sits on side of bed or in a chair throughout the day at home  IADLs: Hobbies: Pt reports enjoying bowling prior to injury. Shopping: Wife Light housekeeping: Wife Meal Prep: Wife Community mobility: Drs appts only at this time Medication management: Ind (medicine bottles) Financial management: Trying to help - has short term disability for job and wife had a stroke and is not back to work since 2023 Handwriting: No changes by reports   MOBILITY STATUS: Needs Assist: RW all the time  POSTURE COMMENTS:  rounded shoulders and forward head  ACTIVITY TOLERANCE: Activity tolerance: Fair  FUNCTIONAL OUTCOME MEASURES:  Eval: Modified Barthel Index of ADLS: 62/100  02/16/23: Modified Barthel 84/100  UPPER EXTREMITY ROM:    Active ROM Right eval Left eval  Shoulder flexion [facility]  Shoulder abduction    Shoulder adduction    Shoulder extension    Shoulder internal rotation    Shoulder external rotation    Elbow flexion    Elbow extension    Wrist flexion    Wrist extension    Wrist ulnar deviation    Wrist radial deviation    Wrist pronation    Wrist supination    (Blank rows = not tested)  UPPER EXTREMITY MMT:     MMT Right eval Left eval  Shoulder flexion 4 4  Shoulder abduction    Shoulder adduction    Shoulder extension    Shoulder internal rotation    Shoulder external rotation     Middle trapezius    Lower trapezius    Elbow flexion    Elbow extension    Wrist flexion    Wrist extension    Wrist ulnar deviation    Wrist radial deviation    Wrist pronation    Wrist supination    (Blank rows = not tested)  HAND FUNCTION: Grip strength: Right: 74.0, 76.7, 66.5 lbs; Left: 67.9, 59.0, 57.7  lbs Average: Right 72.4 lbs; Left 61.5 lbs  02/16/23 Right: 73.6, 82.0, 84.9 Left 76.9, 81.5, 76.4 Average: Right 80.2 lbs Left 78.3 lbs  COORDINATION: Box and Blocks:  Right 33 blocks, Left 39 blocks  02/16/23 Right: 40 blocks Left 40 blocks  SENSATION: WFL - BUEs Across abdomen and thighs - tingling sensations ie) especially when the dog [employer]) rubs on him - goes "0-1000" From OT hospital [facility] [place] sensation comments: numbness/tingling in BLE, reports most ingling in bottom of feet/toes Light Touch Impaired Details: Impaired RLE; Impaired LLE Hot/Cold: Appears Intact Proprioception: Appears  Intact Stereognosis: Not tested Coordination: Gross Motor Movements are Fluid and Coordinated: Yes Fine Motor Movements are Fluid and Coordinated: Yes Coordination and Movement Description: BUE WFL, BLE impaired  EDEMA: na  MUSCLE TONE: WFL  COGNITION: Overall cognitive status: Within functional limits for tasks assessed BIMS at hospital DC last month - 15/15  VISION: Subjective report: No concerns/changes from baseline Baseline vision: Wears contacts Visual history: NA  VISION ASSESSMENT: Not tested  PERCEPTION: Not tested  PRAXIS: Not tested  OBSERVATIONS: Pt arrived in facility transport chair with ability to stand at parallel bars demonstrated in OT evaluation but pt remains holding on at all times for support and has limited activity tolerance in standing.  Pt. appears stronger than he performs and moves slowly when performing table top tasks.     TODAY'S TREATMENT:                                                                                                                                 Blaze pods - 4 pods at vertical surface (mirror), bimanually - gait belt, SBA, pt using rail for support when standing - to improve BUE FM coordination, functional reach, standing tolerance, dynamic standing balance, trunk stability, reaching across midline, gross motor coordination -  Random one color taps mode trials:    R fingertips  - 28 hits, 1.9 second reaction time R fingertips  - 40 hits, 1.3 second reaction time R fingertips - 36 hits, 1.4 second reaction time  L fingertips - 30 hits, 1.7 second reaction time L fingertips - 37 hits, 1.4 second reaction time L fingertips - 39 hits, 1.3 second reaction time  R isolated index finger - 27 hits, 1.8 second reaction time R isolated index finger - 29 hits, 1.8 second reaction time R isolated index finger - 30 hits, 1.7 second reaction time  L isolated index finger - 31 hits, 1.7 second reaction time L isolated index finger - 34 hits, 1.6 second reaction time L isolated index finger - 34 hits, 1.6 second reaction time  Pt tolerated standing approx. 4 to 8 minutes at a time and demo'd SOB when standing which improved as tasks progressed. Pt reported fatigue after tasks though denied dizziness. OT educated pt on upright standing posture, slight bend in knees and wide base of support. Pt demo'd understanding. Pt's knees buckled when sitting after last trial possibly d/t fatigue or locking knees during standing task. OT educated pt on slight bend in knees or weight shifting during standing tasks to decrease fatigue. Pt verbalized understanding.  OT educated pt on practicing standing tolerance at home during daily tasks (e.g. wiping counters, writing, watching TV with timed standing intervals). Pt verbalized understanding.   PATIENT EDUCATION: Education details: see today's treatment above. Person educated: Patient Education method: Explanation, Demonstration, and Verbal cues Education  comprehension: verbalized understanding, returned demonstration, and needs further education  HOME EXERCISE PROGRAM: 02/05/23 - theraputty (green) HEP - access  code ([email or web address]) 02/06/23 - theraband (black) HEP - BUE shoulder - access code [facility]) 02/09/23 - coordination HEP - handout provided, see pt instructions 02/05/23 - progressed theraputty (blue) HEP - access code (same as above: [email or web address]) 03/05/23 - Verbal instructions: Practicing standing tolerance at home during daily tasks (e.g. wiping counters, writing, watching TV with timed standing intervals)   GOALS: Goals reviewed with patient? Yes 02/05/23  SHORT TERM GOALS: Target date: 02/23/23  Patient will demonstrate initial B HEP with 25% verbal cues or less for proper execution. Baseline: New to outpt OT Goal status: MET  2.  Patient will demonstrate at least 10 lbs improvement in B grip strength as needed to open jars and other containers. Baseline: Average: Right 72.4 lbs; Left 61.5 lbs Goal status: IN Progress - Right; MET - Left 02/16/23: Average: Right 80.2 lbs Left 78.3 lbs  3.  Pt will be able to place at least an additional 5+ blocks using BUEs with completion of Box and Blocks test. Baseline: Right 33 blocks, Left 39 blocks Goal status: IN Progress - Left; MET - Right 02/16/23 Right: 40 blocks Left 40 blocks  4.  Pt will be independent with toileting activities in the bathroom again. Baseline: Using BSC in bedroom. Goal status: MET  5. Pt will be independent with LB dressing including socks and shoes.  Baseline: Wife is assisting  02/12/24 - Pt ind donned shoes.  Goal Status: MET  LONG TERM GOALS: Target date: 03/20/23  Patient will demonstrate updated B HEP with visual printouts only for proper execution. Baseline: New to outpt OT Goal status: IN Progress  2.  Patient will demonstrate improved independence with all ADL skills to score >80/100 on the Modified Barthel Index of ADLs. Baseline: Score  62 Goal status: MET 02/16/23 - 84/100  3.  Patient will demonstrate standing balance/tolerance x 5+ minutes for simple meal prep/home management tasks to help at home. Baseline: Remaining upstairs only Goal status: IN Progress 02/16/23 - Pt is going up and down stairs more often but still only sponge bathing due to not being able to get in/out of the tub to shower.  ASSESSMENT:  CLINICAL IMPRESSION: Pt tolerated tasks fairly well though continues to be limited by decreased standing tolerance. Pt demo'd functional reach and coordination of BUE to participate in [place] tasks today. Pt will continue to benefit from skilled OT services in the outpatient setting to work on impairments as noted below to help pt return to PLOF as able.    PERFORMANCE DEFICITS: in functional skills including ADLs, IADLs, coordination, dexterity, ROM, strength, pain, Fine motor control, Gross motor control, mobility, balance, endurance, decreased knowledge of use of DME, and UE functional use, cognitive skills including energy/drive, and psychosocial skills including coping strategies, environmental adaptation, and routines and behaviors.   IMPAIRMENTS: are limiting patient from ADLs, IADLs, work, leisure, and social participation.   CO-MORBIDITIES: may have co-morbidities  that affects occupational performance. Patient will benefit from skilled OT to address above impairments and improve overall function.  REHAB POTENTIAL: Excellent  PLAN:  OT FREQUENCY: 1-2x/week (will need more PT than OT)  OT DURATION: 6 weeks  PLANNED INTERVENTIONS: self care/ADL training, therapeutic exercise, therapeutic activity, neuromuscular re-education, balance training, functional mobility training, aquatic therapy, patient/family education, energy conservation, coping strategies training, and DME and/or AE instructions  RECOMMENDED OTHER SERVICES: PT evaluation rescheduled later this week  CONSULTED AND AGREED WITH PLAN OF  CARE: Patient  PLAN FOR NEXT SESSION:   Continuing training for ADLs/IADLS  standing and seated for increased Ind and tolerance for self care and work tasks - ADL kitchen  Incorporate standing with UE activities for ADLs/IADLS/theraband   Review tub bench transfers PRN  Progress UE HEP - high level coordination  Wynetta Emery, OT 03/05/2023, 1:16 PM

## 2023-03-09 ENCOUNTER — Ambulatory Visit: Payer: BC Managed Care – PPO | Admitting: Physical Therapy

## 2023-03-09 ENCOUNTER — Telehealth: Payer: Self-pay | Admitting: Occupational Therapy

## 2023-03-09 ENCOUNTER — Encounter: Payer: Self-pay | Admitting: Physical Therapy

## 2023-03-09 ENCOUNTER — Ambulatory Visit: Payer: BC Managed Care – PPO | Admitting: Occupational Therapy

## 2023-03-09 DIAGNOSIS — R269 Unspecified abnormalities of gait and mobility: Secondary | ICD-10-CM | POA: Diagnosis not present

## 2023-03-09 DIAGNOSIS — R29818 Other symptoms and signs involving the nervous system: Secondary | ICD-10-CM

## 2023-03-09 DIAGNOSIS — M6281 Muscle weakness (generalized): Secondary | ICD-10-CM

## 2023-03-09 NOTE — Therapy (Unsigned)
OUTPATIENT PHYSICAL THERAPY NEURO TREATMENT   Patient Name: Mario [name] MRN: [id number] DOB:[birth year], 42 y.o., male Today's Date: 03/10/2023   PCP: Clearnce Hasten, PA-C REFERRING PROVIDER: [name], PA-C  END OF SESSION:  PT End of Session - 03/09/23 1025     Visit Number 9    Number of Visits 17    Date for PT Re-Evaluation 04/09/23    Authorization Type [employer] [place]    PT Start Time [phone]    PT Stop Time [phone]    PT Time Calculation (min) 41 min    Equipment Utilized During Treatment Other (comment)    Activity Tolerance Patient tolerated treatment well    Behavior During Therapy [facility] for tasks assessed/performed               Past Medical History:  Diagnosis Date   Bronchitis    Diabetes (HCC)    Diverticulitis 10/2015   Hypertension    Past Surgical History:  Procedure Laterality Date   LUMBAR LAMINECTOMY/DECOMPRESSION MICRODISCECTOMY N/A 12/16/2022   Procedure: THORACIC DECOMPRESSION;  Surgeon: Estill Bamberg, MD;  Location: [facility] OR;  Service: Orthopedics;  Laterality: N/A;   Patient Active Problem List   Diagnosis Date Noted   Abnormality of gait 02/16/2023   Spasticity 02/16/2023   Spinal cord injury, thoracic region (HCC) 01/08/2023   Diabetes (HCC) 01/08/2023   Adjustment disorder with mixed anxiety and depressed mood 12/31/2022   Incomplete paraplegia (HCC) 12/29/2022   Thoracic myelopathy 12/15/2022    ONSET DATE: 01/09/2023 (referral)  REFERRING DIAG: G82.22 (ICD-10-CM) - Paraplegia, incomplete M47.14 (ICD-10-CM) - Other spondylosis with myelopathy, thoracic region  THERAPY DIAG:  Muscle weakness (generalized)  Other symptoms and signs involving the nervous system  Rationale for Evaluation and Treatment: Rehabilitation  SUBJECTIVE:                                                                                                                                                                                              SUBJECTIVE STATEMENT: Patient missed his 8:00 OT appt this morning - didn't realize he had therapies today, thought it was on Tuesday this week.  Pt not wearin foot up brace today but says he has it in his coat pocket  Pt accompanied by: self  PERTINENT HISTORY: thoracic myelopathy, T7 to T10 laminectomy with bilateral partial facetectomy and spinal cord decompression on 08/27  PAIN: 03-09-23 --  pt reports more tightness and not true pain  Are you having pain? Yes: NPRS scale: 2/10 Pain location: left lateral low back pain Pain description: brief spasms Aggravating factors: unsure just happen when I  am sleeping Relieving factors: medication  PRECAUTIONS: Back and Fall  RED FLAGS: None   WEIGHT BEARING RESTRICTIONS: Yes cannot lift over 10 lbs  FALLS: Has patient fallen in last 6 months? Yes. 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  DIAGNOSTIC FINDINGS:  12/14/2022 MR THORACIC SPINE WO CONTRAST: IMPRESSION: 1. No spinal canal stenosis. 2. Mild neural foraminal narrowing on the right at L3-L4 and bilaterally at L4-L5. 3. Multilevel facet arthropathy, which is severe at L4-L5 and on the left at L5-S1, which can be a cause of back pain.  COGNITION: Overall cognitive status: Within functional limits for tasks assessed   GAIT: Gait pattern: step through pattern, decreased hip/knee flexion- Right,  decreased hip/knee flexion- Left, decreased ankle dorsiflexion- Right, decreased ankle dorsiflexion- Left, poor foot clearance- Right, and poor foot clearance- Left (increased hyperextension noted in today's session) Distance walked: various clinic distances, ~ 2 x 60 feet,~ 1 x 200 feet Assistive device utilized: Walker - 2 wheeled Level of assistance: SBA Comments: supination of LE and hyperextension of knees  Gait velocity:  26.03 secs with RW = .38 m/sec (pt not wearing foot up brace on RLE)    TODAY'S TREATMENT:  03-09-23                                                                                                                            Pt amb. With RW to today's session - is not wearing foot up brace  TherEx: Seated bil. Hamstring stretch with contract relax, 3 sec hold - 10 reps each LE Step up exercise onto 4" step with bil. UE support on RW - 10 reps each with CGA Bridging x 5 reps Bridging with LE extension - unilaterally - 5 reps x 2 sets each leg Hip abduction/adduction in hooklying 10 reps SLR RLE and LLE 10 reps each - no resistance Hip flexion RLE and LLE in hooklying position 10 reps each leg Trunk rotation stretch - 15 sec hold each side Seated LAQ with red theraband 10 reps RLE & LLE- 3 sec hold Seated knee flexion with red theraband - RLE and LLE - 3 sec hold Standing hip extension 10 reps each leg with red theraband  Issued these exercises for HEP - pt was given red theraband  Access Code: [email or web address] URL: [email or web address] Date: 03/10/2023 Prepared by: Maebelle Munroe  Exercises - Seated Hamstring Curl with Anchored Resistance  -  1 x daily - 7 x weekly - 1 sets - 10 reps - Seated Knee Lifts with Resistance  - 1 x daily - 7 x weekly - 1 sets - 10 reps - Standing Hip Extension with Resistance  - 1 x daily - 7 x weekly - 1 sets - 10 reps     PATIENT EDUCATION: Education details: added exercises with red theraband to HEP; Medbridge  [id number] Person educated: Patient Education method: Explanation and Handouts Education comprehension: verbalized understanding  HOME EXERCISE PROGRAM:   Do each exercise 1-2  times per day Do each exercise 10 repetitions Hold each exercise for 2 seconds to feel your location  AT SINK FIND YOUR MIDLINE POSITION AND PLACE FEET EQUAL DISTANCE FROM THE MIDLINE.  Try to find this position when standing still for activities.   Side to Side Shift: Moving your hips only (not shoulders): move weight onto your left leg, HOLD/FEEL.  Move back to equal weight on each leg, HOLD/FEEL. Move weight onto your right leg, HOLD/FEEL. Move back to equal weight on each leg, HOLD/FEEL. Repeat.  Start with both hands on sink, progress to right hand only, then no hands.  Moving Cones / Cups: With equal weight on each leg: Hold on with one hand the first time, then progress to no hand supports. Move cups from one side of sink to the other. Place cups ~2" out of your reach, progress to 10" beyond reach.  Place one hand in middle of sink and reach with other hand. Do both arms.  Then hover one hand and move cups with other hand.  Overhead/Upward Reaching: alternated reaching up to top cabinets or ceiling if no cabinets present. Keep equal weight on each leg. Start with one hand support on counter while other hand reaches and progress to no hand support with reaching.  ace one hand in middle of sink and  6.  Stepping:  Move items under cabinet out of your way. Step out with one foot and then bring it back; step again to side.  7. Marching at counter  GOALS: Goals reviewed with patient? Yes  SHORT TERM GOALS: Target date: 02/26/2023  Patient will demonstrate independence with initial HEP to continue to progress between physical therapy sessions.   Baseline: To be provided Goal status: MET 02-23-23  2.  Patient will improve gait speed to 0.4 m/s or greater with LRAD to indicate an improvement from being a household  ambulator to limited community ambulator.   Baseline: 0.3 m/s with 2WW + R foot up brace (SBA);  26.03 secs = .38 m/sec Goal status: Partially met 02-23-23  3.  Patient will improve their 5x Sit to Stand score to less than 37 seconds to demonstrate a decreased risk for falls and improved LE strength.   Baseline: 42.77 seconds with UE use;  29.03 secs with bil. UE support from standard chair Goal status: MET 02-23-23  4.  Patient will improve TUG score to 75 seconds with LRAD to indicate a decreased risk of falls and demonstrate improved overall mobility.   Baseline: 85 seconds with 2WW;  37.69 secs with RW Goal status: MET 02-23-23  LONG TERM GOALS: Target date: 04/09/2023  Patient will report demonstrate independence with final HEP in order to maintain current gains and continue to progress after physical therapy discharge.   Baseline: To be provided Goal status: INITIAL  2.  Patient will improve gait speed to 0.5 m/s with LRAD to indicate improvement towards the level of community  ambulator in order to participate more easily in activities outside of the home.   Baseline: 0.3 m/s with 2WW + R foot up brace (SBA) Goal status: INITIAL  3.  Patient will improve their 5x Sit to Stand score to less than 34 seconds to demonstrate a decreased risk for falls and improved LE strength.   Baseline: 42.77 seconds with UE use Goal status: INITIAL  4.  Patient will improve TUG score to 60 seconds or less with LRAD to indicate a decreased risk of falls and demonstrate improved overall mobility.   Baseline: 85 seconds with 2WW Goal status: INITIAL  ASSESSMENT:  CLINICAL IMPRESSION: PT session focused on exercises for bil. LE strengthening and stretching.  HEP was updated to include PRE's with red theraband including LAQ, seated knee flexion and hip extension in standing.  Pt had difficulty performing bridging exercise with LE extension and had slightly more difficulty performing LLE SLR than RLE.  Pt  not wearing foot orthosis to today's session.  Cont with POC.  OBJECTIVE IMPAIRMENTS: Abnormal gait, decreased activity tolerance, decreased balance, decreased endurance, decreased mobility, difficulty walking, decreased ROM, decreased strength, increased muscle spasms, impaired flexibility, impaired sensation, impaired UE functional use, and pain.   ACTIVITY LIMITATIONS: carrying, bending, standing, transfers, reach over head, and locomotion level  PARTICIPATION LIMITATIONS: cleaning, laundry, community activity, occupation, and yard work  PERSONAL FACTORS: Past/current experiences, Time since onset of injury/illness/exacerbation, and 1-2 comorbidities: see above  are also affecting patient's functional outcome.   REHAB POTENTIAL: Good  CLINICAL DECISION MAKING: Evolving/moderate complexity  EVALUATION COMPLEXITY: Moderate  PLAN:  PT FREQUENCY: 2x/week (1x on land, 1x in pool as possible)  PT DURATION: 8 weeks  PLANNED INTERVENTIONS: 97146- PT Re-evaluation, 97110-Therapeutic exercises, 97530- Therapeutic activity, 97112- Neuromuscular re-education, 97535- Self Care, 16109- Manual therapy, [phone]- Gait training, [phone]- Aquatic Therapy, Patient/Family education, Dry Needling, and DME instructions  PLAN FOR NEXT SESSION: work on balance and strength, AD trial if insurance will allow (patient foot up brace already paid by insurance when?), work on flexion based activiites to minimize tone with SciFit warmup?, AFO trials (L and R versus R alone and trial [place]) with caregiver/patient education on how to don/doff Added on 4 additional weeks of PT (land/aquatic); activities to promote LE flexion   Aquatic: anything to work on promoting LE flexion and gait with reduced UE support  Kary Kos, PT 03/10/2023, 9:14 AM

## 2023-03-09 NOTE — Telephone Encounter (Signed)
Pt no-showed for his OT visit this morning at 8AM.  ~8:30 AM, OT contact pt via home number to check and remind him he had P.T. after OT this morning and he confirmed that he could make that appt.  He also confirmed that he has a calendar printed of his rehab appts but he had forgot to look at it because he has a DR's appt today and assumed his therapies were tomorrow.

## 2023-03-12 ENCOUNTER — Ambulatory Visit: Payer: BC Managed Care – PPO | Admitting: Physical Therapy

## 2023-03-12 ENCOUNTER — Ambulatory Visit: Payer: BC Managed Care – PPO | Admitting: Occupational Therapy

## 2023-03-12 ENCOUNTER — Telehealth: Payer: Self-pay | Admitting: Occupational Therapy

## 2023-03-12 NOTE — Telephone Encounter (Signed)
Pt no-showed for his OT visit this morning at 8 AM. OT contact pt via mobile number to check and left voicemail to remind pt about aquatic PT appointment at 9:30 AM today.

## 2023-03-17 ENCOUNTER — Encounter: Payer: Self-pay | Admitting: Occupational Therapy

## 2023-03-17 ENCOUNTER — Encounter: Payer: Self-pay | Admitting: Physical Therapy

## 2023-03-17 ENCOUNTER — Ambulatory Visit: Payer: BC Managed Care – PPO | Admitting: Physical Therapy

## 2023-03-17 ENCOUNTER — Ambulatory Visit: Payer: BC Managed Care – PPO | Admitting: Occupational Therapy

## 2023-03-17 VITALS — BP 158/87 | HR 77

## 2023-03-17 DIAGNOSIS — R29818 Other symptoms and signs involving the nervous system: Secondary | ICD-10-CM

## 2023-03-17 DIAGNOSIS — R2681 Unsteadiness on feet: Secondary | ICD-10-CM

## 2023-03-17 DIAGNOSIS — M6281 Muscle weakness (generalized): Secondary | ICD-10-CM

## 2023-03-17 DIAGNOSIS — R269 Unspecified abnormalities of gait and mobility: Secondary | ICD-10-CM | POA: Diagnosis not present

## 2023-03-17 NOTE — Therapy (Signed)
OUTPATIENT PHYSICAL THERAPY NEURO TREATMENT / 10th Visiit Progress Note / DISCHARGE   Patient Name: Mario [name] MRN: [id number] DOB:02-25-1981, 42 y.o., male Today's Date: 03/17/2023   PCP: Clearnce Hasten, PA-C REFERRING PROVIDER: [name], PA-C  END OF SESSION:  PT End of Session - 03/17/23 0858     Visit Number 10    Number of Visits 17    Date for PT Re-Evaluation 04/09/23    Authorization Type Blue Cross [place]    PT Start Time [phone]    PT Stop Time [phone]    PT Time Calculation (min) 27 min    Equipment Utilized During Treatment Gait belt    Activity Tolerance Patient tolerated treatment well    Behavior During Therapy [facility] for tasks assessed/performed             Past Medical History:  Diagnosis Date   Bronchitis    Diabetes (HCC)    Diverticulitis 10/2015   Hypertension    Past Surgical History:  Procedure Laterality Date   LUMBAR LAMINECTOMY/DECOMPRESSION MICRODISCECTOMY N/A 12/16/2022   Procedure: THORACIC DECOMPRESSION;  Surgeon: Estill Bamberg, MD;  Location: [facility] OR;  Service: Orthopedics;  Laterality: N/A;   Patient Active Problem List   Diagnosis Date Noted   Abnormality of gait 02/16/2023   Spasticity 02/16/2023   Spinal cord injury, thoracic region [facility]) 01/08/2023   Diabetes (HCC) 01/08/2023   Adjustment disorder with mixed anxiety and depressed mood 12/31/2022   Incomplete paraplegia (HCC) 12/29/2022   Thoracic myelopathy 12/15/2022    ONSET DATE: 01/09/2023 (referral)  REFERRING DIAG: G82.22 (ICD-10-CM) - Paraplegia, incomplete M47.14 (ICD-10-CM) - Other spondylosis with myelopathy, thoracic region  THERAPY DIAG:  Muscle weakness (generalized)  Other symptoms and signs involving the nervous system  Unsteadiness on feet  Abnormality of gait and mobility  Rationale for Evaluation and Treatment: Rehabilitation  SUBJECTIVE:                                                                                                                                                                                              SUBJECTIVE STATEMENT: Patient arrives to session 12 minutes late. Patient denies falls and near falls since last here. Patient reports awareness of D/C from PT and is agreement for D/C from PT too.   Pt accompanied by: self  PERTINENT HISTORY: thoracic myelopathy, T7 to T10 laminectomy with bilateral partial facetectomy and spinal cord decompression on 08/27  PAIN: 03-09-23 --  pt reports more tightness and not true pain  Are you having pain? Yes: NPRS scale: 2/10 Pain location: left lateral low back pain Pain description: brief  spasms Aggravating factors: unsure just happen when I am sleeping Relieving factors: medication  PRECAUTIONS: Back and Fall  RED FLAGS: None   WEIGHT BEARING RESTRICTIONS: Yes cannot lift over 10 lbs  FALLS: Has patient fallen in last 6 months? Yes. 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  DIAGNOSTIC FINDINGS:  12/14/2022 MR THORACIC SPINE WO CONTRAST: IMPRESSION: 1. No spinal canal stenosis. 2. Mild neural foraminal narrowing on the right at L3-L4 and bilaterally at L4-L5. 3. Multilevel facet arthropathy, which is severe at L4-L5 and on the left at L5-S1, which can be a cause of back pain.  COGNITION: Overall cognitive status: Within functional limits for tasks  assessed   GAIT: Gait pattern: step through pattern, decreased hip/knee flexion- Right, decreased hip/knee flexion- Left, decreased ankle dorsiflexion- Right, decreased ankle dorsiflexion- Left, poor foot clearance- Right, and poor foot clearance- Left (increased hyperextension noted in today's session) Distance walked: various clinic distances, ~ 2 x 60 feet,~ 1 x 200 feet Assistive device utilized: Walker - 2 wheeled Level of assistance: SBA Comments: supination of LE and hyperextension of knees  Gait velocity:  26.03 secs with RW = .38 m/sec (pt not wearing foot up brace on RLE)   TODAY'S TREATMENT:                                                                                                                           Pt ambulates into clinic with RW to today's session - is not wearing foot up brace  TherAct: Vitals:   03/17/23 0904  BP: (!) 158/87  Pulse: 77  Seated on LUE; elevated but WFL for therapy      PATIENT EDUCATION: Education details: added exercises with red theraband to HEP; Medbridge [id number] Person educated: Patient Education method: Explanation and Handouts Education comprehension: verbalized understanding  HOME EXERCISE PROGRAM:   Do each exercise 1-2  times per day Do each exercise 10 repetitions Hold each exercise for 2 seconds to feel your location  AT SINK FIND YOUR MIDLINE POSITION AND PLACE FEET EQUAL DISTANCE FROM THE MIDLINE.  Try to find this position when standing still for activities.   Side to Side Shift: Moving your hips only (not shoulders): move weight onto your left leg, HOLD/FEEL.  Move back to equal weight on each leg, HOLD/FEEL. Move weight onto your right leg, HOLD/FEEL. Move back to equal weight on each leg, HOLD/FEEL. Repeat.  Start with both hands on sink, progress to right hand only, then no hands.  Moving Cones / Cups: With equal weight on each leg: Hold on with one hand the first time, then progress to no hand supports. Move  cups from one side of sink to the other. Place cups ~2" out of your reach, progress to 10" beyond reach.  Place one hand in middle of sink and reach with other hand. Do both arms.  Then hover one hand and move cups with other hand.  Overhead/Upward Reaching: alternated reaching up to top cabinets or ceiling if no cabinets present. Keep equal weight on each leg. Start with one hand support on counter while other hand reaches and progress to no hand support with reaching.  ace one hand in middle of sink and  6.  Stepping:  Move items under cabinet out of your way. Step out with one foot and then bring it back; step again to side.  7. Marching at counter  Access Code: [email or web address] URL: [email or web address] Date: 03/10/2023 Prepared by: Maebelle Munroe  Exercises - Seated Hamstring Curl with Anchored Resistance  - 1 x daily - 7 x weekly - 1 sets - 10 reps - Seated Knee Lifts with Resistance  - 1 x daily - 7 x weekly - 1 sets - 10 reps - Standing Hip Extension with Resistance  - 1 x daily - 7 x weekly - 1 sets - 10 reps  GOALS: Goals reviewed with patient? Yes  SHORT TERM GOALS: Target date: 02/26/2023  Patient will demonstrate independence with initial HEP to continue to progress between physical therapy sessions.   Baseline: To be provided Goal status: MET 02-23-23  2.  Patient will improve gait speed to 0.4 m/s or greater with LRAD to indicate an improvement from being a household ambulator to limited community ambulator.   Baseline: 0.3 m/s with 2WW + R foot up brace (SBA);  26.03 secs = .38 m/sec Goal status: Partially met 02-23-23  3.  Patient will improve their 5x Sit to Stand score to less than 37 seconds to demonstrate a decreased risk for falls and improved LE strength.   Baseline: 42.77 seconds with UE use;  29.03 secs with bil. UE support from standard chair Goal status: MET 02-23-23  4.  Patient will improve TUG score to 75 seconds with LRAD to indicate a decreased risk of  falls and demonstrate improved overall mobility.   Baseline: 85 seconds with 2WW;  37.69 secs with RW Goal status: MET 02-23-23  LONG TERM GOALS: Target date: 04/09/2023  Patient will report demonstrate independence with final HEP in order to maintain current gains and continue to progress after physical therapy discharge.   Baseline: To be provided, reports confidence in final HEP; reports confidence in final HEP Goal status: MET  2.  Patient will improve gait speed to 0.5 m/s with LRAD to indicate improvement towards the level of community ambulator in order to participate more easily in activities outside of the home.   Baseline: 0.3 m/s with 2WW + R foot up brace (SBA); 0.32 m/s with 2WW Goal status: NOT MET  3.  Patient will improve their 5x Sit to Stand score to less than 34 seconds to demonstrate a decreased risk for falls and improved LE strength.   Baseline: 42.77 seconds with UE use; 32.16 seconds with strong reliance on UE use Goal status: MET  4.  Patient will improve TUG score to 60 seconds or less with LRAD to indicate  a decreased risk of falls and demonstrate improved overall mobility.   Baseline: 85 seconds with 2WW; improved to 39.35 seconds with 2WW Goal status: MET ASSESSMENT:  CLINICAL IMPRESSION: PT D/C from PT due to plateau in progress at this time from when goals last assessed. Patient has had multiple no shows to PT and OT which may also contribute to plateau in progress. Patient in agreement to PT discharge at this time. Patient did achieve majority of LTGs with exception of gait speed. Remains falls risk but with use of 2WW is appropriate and safe at this time. Anticipate that patient's gait could improve with use of AFO but patient not wanting to pursue at this time. Patient D/C with updated HEP.   OBJECTIVE IMPAIRMENTS: Abnormal gait, decreased activity tolerance, decreased balance, decreased endurance, decreased mobility, difficulty walking, decreased ROM,  decreased strength, increased muscle spasms, impaired flexibility, impaired sensation, impaired UE functional use, and pain.   ACTIVITY LIMITATIONS: carrying, bending, standing, transfers, reach over head, and locomotion level  PARTICIPATION LIMITATIONS: cleaning, laundry, community activity, occupation, and yard work  PERSONAL FACTORS: Past/current experiences, Time since onset of injury/illness/exacerbation, and 1-2 comorbidities: see above  are also affecting patient's functional outcome.   REHAB POTENTIAL: Good  CLINICAL DECISION MAKING: Evolving/moderate complexity  EVALUATION COMPLEXITY: Moderate  PLAN:  PT FREQUENCY: 2x/week (1x on land, 1x in pool as possible)  PT DURATION: 8 weeks  PLANNED INTERVENTIONS: 97146- PT Re-evaluation, 97110-Therapeutic exercises, 97530- Therapeutic activity, [id number]- Neuromuscular re-education, 97535- Self Care, 95621- Manual therapy, [id number]- Gait training, [phone]- Aquatic Therapy, Patient/Family education, Dry Needling, and DME instructions  PLAN FOR NEXT SESSION: NA - D/C with updated HEP   Carmelia Bake, PT, DPT 03/17/2023, 11:30 AM

## 2023-03-17 NOTE — Therapy (Signed)
[facility] [address] [place], [place], [address] Phone: [phone]   Fax:  [phone]  March 17, 2023     Occupational Therapy Discharge Summary   Patient: Mario [name] MRN: [id number] Date of Birth: 1981-01-30  Visits from Start of Care: The above patient had been seen in Occupational Therapy 8 times with 5 no shows to OT appointments (including this morning).  Current functional level related to goals / functional outcomes: The patient is: Improved Pt has met 3/5 short term goals and 1/3 long term goals to satisfactory levels.  Remaining deficits: Pt has remaining functional deficits in strength, coordination and related ADL skills ie) generally sponge bathing versus showering.  Intervention/Education / Equipment: Occupational Therapy treatment consisted of therapeutic exercises and activities, neuromuscular reeducation, ADL compensatory training and pt/family education. Pt has various materials and HEP education/handouts for theraputty, theraband, coordination etc. Pt has demonstrated understanding of HEPs.  See tx notes for more details.   Patient left a message via voicemail re: discharge due to non-adherence to attendance policy with 3 no-show appointments in a row.   Sincerely,  Victorino Sparrow, OT  [facility] [address] [place], [place], [address] Phone: [phone]   Fax:  [phone]  Patient: [name] MRN: [id number] Date of Birth: 1981/03/23

## 2023-03-26 ENCOUNTER — Ambulatory Visit: Payer: BC Managed Care – PPO | Admitting: Physical Therapy

## 2023-05-18 ENCOUNTER — Ambulatory Visit: Payer: BC Managed Care – PPO | Admitting: Physical Medicine and Rehabilitation

## 2023-06-16 ENCOUNTER — Telehealth: Payer: Self-pay | Admitting: Diagnostic Neuroimaging

## 2023-06-16 NOTE — Telephone Encounter (Signed)
 Pt reschedule appointment due to scheduling conflict.

## 2023-06-17 ENCOUNTER — Ambulatory Visit: Payer: BC Managed Care – PPO | Admitting: Diagnostic Neuroimaging

## 2023-10-05 ENCOUNTER — Encounter: Payer: Self-pay | Admitting: Diagnostic Neuroimaging

## 2023-10-05 ENCOUNTER — Ambulatory Visit (INDEPENDENT_AMBULATORY_CARE_PROVIDER_SITE_OTHER): Payer: BC Managed Care – PPO | Admitting: Diagnostic Neuroimaging

## 2023-10-05 VITALS — BP 162/102 | HR 78 | Ht 74.0 in | Wt 288.0 lb

## 2023-10-05 DIAGNOSIS — R202 Paresthesia of skin: Secondary | ICD-10-CM

## 2023-10-05 DIAGNOSIS — M4714 Other spondylosis with myelopathy, thoracic region: Secondary | ICD-10-CM

## 2023-10-05 NOTE — Patient Instructions (Signed)
  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 Jackee Marus (ortho) and Dr. 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

## 2023-10-05 NOTE — Progress Notes (Signed)
 [facility]  PATIENT: Mario [name] DOB: 12/04/1980  REFERRING CLINICIAN: Liborio Reeds, PA-C HISTORY FROM: patient  REASON FOR VISIT: new consult   HISTORICAL  CHIEF COMPLAINT:  Chief Complaint  Patient presents with   New Patient (Initial Visit)    Pt alone, rm 6. States that he had a spinal decompression sx in aug 2024. Since the sx he has noted neuropathy present and feels like he can't fully extend the RLE. He states sensation is off on bottom part of foot.     HISTORY OF PRESENT ILLNESS:   43 year old male here for evaluation of lower extremity numbness and tingling.  In 2024 patient had onset of lower extremity numbness, weakness, back pain issues.  He was having more difficulty with balance and walking.  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  He was planned to have surgery but then had sudden worsening of symptoms before it was scheduled.  He went to the emergency room for evaluation.  He was admitted for emergency surgery.  Following this his back pain and lower extremity weakness have improved.  He does have some residual tingling station in the toes and feet.   REVIEW OF SYSTEMS: Full 14 system review of systems performed and negative with exception of: as per HPI.  ALLERGIES: No Known Allergies  HOME MEDICATIONS: Outpatient Medications Prior to Visit  Medication Sig Dispense Refill   baclofen  (LIORESAL ) 10 MG tablet Take 1 tablet (10 mg total) by mouth 4 (four) times daily. For spasticity 120 each 5   metFORMIN  (GLUCOPHAGE -XR) 500 MG 24 hr tablet Take 1 tablet by mouth daily with breakfast.     pravastatin (PRAVACHOL) 40 MG tablet Take 1 tablet by mouth daily.     valsartan (DIOVAN) 80 MG tablet Take 1 tablet by mouth daily.     acetaminophen  (TYLENOL ) 325 MG suppository Place rectally.     acetaminophen  (TYLENOL ) 325 MG tablet Take 1-2 tablets (325-650 mg total) by mouth every 4 (four) hours as needed for mild pain.      apixaban  (ELIQUIS ) 2.5 MG TABS tablet Take 1 tablet (2.5 mg total) by mouth 2 (two) times daily. 60 tablet 0   apixaban  (ELIQUIS ) 2.5 MG TABS tablet Take 1 tablet (2.5 mg total) by mouth 2 (two) times daily. 60 tablet 0   benzocaine  (ORAJEL) 10 % mucosal gel Take by mouth.     HYDROcodone -acetaminophen  (NORCO/VICODIN) 5-325 MG tablet Take 1 tablet by mouth every 6 (six) hours as needed.     melatonin (MELATONIN MAXIMUM STRENGTH) 5 MG TABS Take by mouth.     naloxone  (NARCAN ) nasal spray 4 mg/0.1 mL Place into the nose.     oxyCODONE -acetaminophen  (PERCOCET/ROXICET) 5-325 MG tablet Take by mouth.     senna-docusate (SENOKOT-S) 8.6-50 MG tablet Take by mouth.     tiZANidine  (ZANAFLEX ) 2 MG tablet Take by mouth.     tiZANidine  (ZANAFLEX ) 4 MG tablet Take 1 tablet (4 mg total) by mouth 4 (four) times daily. With baclofen  for spasticity 120 tablet 5   No facility-administered medications prior to visit.    PAST MEDICAL HISTORY: Past Medical History:  Diagnosis Date   Bronchitis    Diabetes (HCC)    Diverticulitis 10/2015   Hypertension     PAST SURGICAL HISTORY: Past Surgical History:  Procedure Laterality Date   LUMBAR LAMINECTOMY/DECOMPRESSION MICRODISCECTOMY N/A 12/16/2022   Procedure: THORACIC DECOMPRESSION;  Surgeon: Virl Grimes, MD;  Location: [facility] OR;  Service: Orthopedics;  Laterality: N/A;  FAMILY HISTORY: Family History  Problem Relation Age of Onset   Hyperlipidemia Mother    Diabetes Mother    Heart disease Father     SOCIAL HISTORY: Social History   Socioeconomic History   Marital status: Married    Spouse name: Not on file   Number of children: Not on file   Years of education: Not on file   Highest education level: Not on file  Occupational History   Not on file  Tobacco Use   Smoking status: Every Day    Current packs/day: 0.30    Types: Cigarettes   Smokeless tobacco: Never  Vaping Use   Vaping status: Never Used  Substance and Sexual Activity    Alcohol use: Yes    Comment: occasionally   Drug use: Not Currently    Types: Cocaine   Sexual activity: Not on file  Other Topics Concern   Not on file  Social History Narrative   ** Merged History Encounter **       Social Drivers of Health   Financial Resource Strain: Not on file  Food Insecurity: Low Risk  (07/24/2023)   Received from [facility]   Hunger Vital Sign    Within the past 12 months, you worried that your food would run out before you got money to buy more: Never true    Within the past 12 months, the food you bought just didn't last and you didn't have money to get more. : Never true  Transportation Needs: No Transportation Needs (07/24/2023)   Received from [facility]    In the past 12 months, has lack of reliable transportation kept you from medical appointments, meetings, work or from getting things needed for daily living? : No  Physical Activity: Not on file  Stress: Not on file (02/26/2023)  Social Connections: Not on file  Intimate Partner Violence: Not At Risk (12/15/2022)   Humiliation, Afraid, Rape, and Kick questionnaire    Fear of Current or Ex-Partner: No    Emotionally Abused: No    Physically Abused: No    Sexually Abused: No     PHYSICAL EXAM  GENERAL EXAM/CONSTITUTIONAL: Vitals:  Vitals:   10/05/23 0949  BP: (!) 162/102  Pulse: 78  Weight: 288 lb (130.6 kg)  Height: 6' 2 (1.88 m)   Body mass index is 36.98 kg/m. Wt Readings from Last 3 Encounters:  10/05/23 288 lb (130.6 kg)  02/16/23 276 lb (125.2 kg)  12/31/22 279 lb 1.6 oz (126.6 kg)   Patient is in no distress; well developed, nourished and groomed; neck is supple  CARDIOVASCULAR: Examination of carotid arteries is normal; no carotid bruits Regular rate and rhythm, no murmurs Examination of peripheral vascular system by observation and palpation is normal  EYES: Ophthalmoscopic exam of optic discs and posterior segments is normal; no papilledema or  hemorrhages No results found.  MUSCULOSKELETAL: Gait, strength, tone, movements noted in Neurologic exam below  NEUROLOGIC: MENTAL STATUS:      No data to display         awake, alert, oriented to person, place and time recent and remote memory intact normal attention and concentration language fluent, comprehension intact, naming intact fund of knowledge appropriate  CRANIAL NERVE:  2nd - no papilledema on fundoscopic exam 2nd, 3rd, 4th, 6th - pupils equal and reactive to light, visual fields full to confrontation, extraocular muscles intact, no nystagmus 5th - facial sensation symmetric 7th - facial strength symmetric 8th - hearing  intact 9th - palate elevates symmetrically, uvula midline 11th - shoulder shrug symmetric 12th - tongue protrusion midline  MOTOR:  normal bulk and tone, full strength in the BUE, BLE  SENSORY:  normal and symmetric to light touch, temperature, vibration  COORDINATION:  finger-nose-finger, fine finger movements normal  REFLEXES:  deep tendon reflexes 1+ and symmetric  GAIT/STATION:  narrow based gait     DIAGNOSTIC DATA (LABS, IMAGING, TESTING) - I reviewed patient records, labs, notes, testing and imaging myself where available.  Lab Results  Component Value Date   WBC 6.1 01/08/2023   HGB 13.4 01/08/2023   HCT 39.7 01/08/2023   MCV 88.2 01/08/2023   PLT 397 01/08/2023      Component Value Date/Time   NA 137 01/08/2023 0910   K 4.0 01/08/2023 0910   CL 103 01/08/2023 0910   CO2 25 01/08/2023 0910   GLUCOSE 178 (H) 01/08/2023 0910   BUN 7 01/08/2023 0910   CREATININE 0.89 01/08/2023 0910   CALCIUM 9.0 01/08/2023 0910   PROT 7.9 12/25/2022 0550   ALBUMIN  3.4 (L) 12/25/2022 0550   AST 19 12/25/2022 0550   ALT 27 12/25/2022 0550   ALKPHOS 70 12/25/2022 0550   BILITOT 0.7 12/25/2022 0550   GFRNONAA >60 01/08/2023 0910   GFRAA >60 05/28/2019 1952   No results found for: CHOL, HDL, LDLCALC, LDLDIRECT,  TRIG, CHOLHDL Lab Results  Component Value Date   HGBA1C 6.7 (H) 12/24/2022   No results found for: VITAMINB12 Lab Results  Component Value Date   TSH 1.340 11/13/2022     12/14/22 MRI thoracic spine [I reviewed images myself and agree with interpretation. -VRP]  1. Increased T2 signal in the T9 and T11 vertebral bodies, with approximately 30% vertebral body height loss anteriorly, favored to represent subacute fractures. 2. Unchanged increased STIR signal in the bilateral pedicles of T8 and T9, which may represent stress reaction. 3. 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    ASSESSMENT AND PLAN  43 y.o. year old male here with:  Dx:  1. Tingling of both feet   2. 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 Jackee Marus (ortho) and Dr. 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    Omega Bible, MD 10/05/2023, 10:41 AM Certified in Neurology, Neurophysiology and Neuroimaging  [facility] [address], [address] [place], [place] [address] [phone]

## 2023-10-13 ENCOUNTER — Emergency Department (HOSPITAL_COMMUNITY)

## 2023-10-13 ENCOUNTER — Emergency Department (HOSPITAL_COMMUNITY)
Admission: EM | Admit: 2023-10-13 | Discharge: 2023-10-13 | Disposition: A | Discharge status: Home / Self Care | Attending: Emergency Medicine | Admitting: Emergency Medicine

## 2023-10-13 ENCOUNTER — Other Ambulatory Visit: Payer: Self-pay

## 2023-10-13 ENCOUNTER — Encounter (HOSPITAL_COMMUNITY): Payer: Self-pay

## 2023-10-13 DIAGNOSIS — Z79899 Other long term (current) drug therapy: Secondary | ICD-10-CM | POA: Insufficient documentation

## 2023-10-13 DIAGNOSIS — I10 Essential (primary) hypertension: Secondary | ICD-10-CM | POA: Diagnosis not present

## 2023-10-13 DIAGNOSIS — M545 Low back pain, unspecified: Secondary | ICD-10-CM | POA: Insufficient documentation

## 2023-10-13 DIAGNOSIS — W01198A Fall on same level from slipping, tripping and stumbling with subsequent striking against other object, initial encounter: Secondary | ICD-10-CM | POA: Insufficient documentation

## 2023-10-13 DIAGNOSIS — M546 Pain in thoracic spine: Secondary | ICD-10-CM | POA: Diagnosis present

## 2023-10-13 DIAGNOSIS — W19XXXA Unspecified fall, initial encounter: Secondary | ICD-10-CM

## 2023-10-13 DIAGNOSIS — E119 Type 2 diabetes mellitus without complications: Secondary | ICD-10-CM | POA: Diagnosis not present

## 2023-10-13 DIAGNOSIS — Z7984 Long term (current) use of oral hypoglycemic drugs: Secondary | ICD-10-CM | POA: Diagnosis not present

## 2023-10-13 DIAGNOSIS — M549 Dorsalgia, unspecified: Secondary | ICD-10-CM

## 2023-10-13 MED ORDER — METHOCARBAMOL 500 MG PO TABS: 500.0000 mg | ORAL_TABLET | Freq: Two times a day (BID) | ORAL | 0 refills | Status: DC

## 2023-10-13 MED ORDER — METHOCARBAMOL 500 MG PO TABS
500.0000 mg | ORAL_TABLET | Freq: Two times a day (BID) | ORAL | 0 refills | Status: AC
Start: 2023-10-13 — End: ?

## 2023-10-13 NOTE — ED Provider Triage Note (Signed)
 Emergency Medicine Provider Triage Evaluation Note  Rhys Lichty , a 43 y.o. male  was evaluated in triage.  Pt complains of mid to low back pain.  History of thoracic decompression.  He states he fell 2 days ago face first however since then his back has been giving him issues.  Review of Systems  Positive: As above Negative: As above  Physical Exam  BP (!) 136/93 (BP Location: Right Arm)   Pulse 76   Temp 98.1 F (36.7 C) (Oral)   Resp 16   SpO2 98%  Gen:   Awake, no distress   Resp:  Normal effort  MSK:   Moves extremities without difficulty  Other:    Medical Decision Making  Medically screening exam initiated at 2:18 PM.  Appropriate orders placed.  Sam Cygan was informed that the remainder of the evaluation will be completed by another provider, this initial triage assessment does not replace that evaluation, and the importance of remaining in the ED until their evaluation is complete.     Hildegard Loge, PA-C 10/13/23 1418

## 2023-10-13 NOTE — ED Provider Notes (Signed)
 [place] EMERGENCY DEPARTMENT AT [facility] Provider Note   CSN: [id number] Arrival date & time: 10/13/23  1351     Patient presents with: Back Pain   Mario [name] is a 43 y.o. male.   Patient with history of diabetes, hypertension, chronic back pain presents today with complaints of fall, back pain. He states that 2 days ago he sustained a mechanical fall. Reports he tripped and fell forward. Notes that he struck his face on the ground, did not loose consciousness and is not anticoagulated. He has been able to walk since the fall, however since has had pain in his back in a similar location from his previous before he had surgery. States that he had spine surgery last August and wanted to ensure that he didn't damage anything from his surgery. He denies any loss of bowel or bladder function or saddle paresthesias.  Denies any sharp shooting pain down his extremities or numbness/tingling.  The history is provided by the patient. No language interpreter was used.  Back Pain      Prior to Admission medications   Medication Sig Start Date End Date Taking? Authorizing Provider  baclofen  (LIORESAL ) 10 MG tablet Take 1 tablet (10 mg total) by mouth 4 (four) times daily. For spasticity 02/16/23   Lovorn, Megan, MD  metFORMIN  (GLUCOPHAGE -XR) 500 MG 24 hr tablet Take 1 tablet by mouth daily with breakfast. 10/02/22 10/05/23  [provider]  pravastatin (PRAVACHOL) 40 MG tablet Take 1 tablet by mouth daily. 03/05/23 03/04/24  [provider]  valsartan (DIOVAN) 80 MG tablet Take 1 tablet by mouth daily. 10/01/22   [provider]    Allergies: Patient has no known allergies.    Review of Systems  Musculoskeletal:  Positive for back pain.  All other systems reviewed and are negative.   Updated Vital Signs BP (!) 137/92 (BP Location: Left Arm)   Pulse 68   Temp 98.2 F (36.8 C) (Oral)   Resp 16   Ht 6' 2 (1.88 m)   Wt 130.6 kg   SpO2 100%    BMI 36.98 kg/m   Physical Exam Vitals and nursing note reviewed.  Constitutional:      General: He is not in acute distress.    Appearance: Normal appearance. He is normal weight. He is not ill-appearing, toxic-appearing or diaphoretic.  HENT:     Head: Normocephalic and atraumatic.     Comments: No racoon eyes No battle sign  Eyes:     Extraocular Movements: Extraocular movements intact.     Pupils: Pupils are equal, round, and reactive to light.    Cardiovascular:     Rate and Rhythm: Normal rate.     Comments: No tenderness to palpation of the anterior chest wall Pulmonary:     Effort: Pulmonary effort is normal. No respiratory distress.  Abdominal:     Comments: No abdominal tenderness or bruising   Musculoskeletal:        General: Normal range of motion.     Cervical back: Normal and normal range of motion.     Thoracic back: Normal.     Lumbar back: Normal.     Comments: No midline tenderness, no stepoffs or deformity noted on palpation of cervical, thoracic, and lumbar spine. Mild TTP noted to palpation of the bilateral paraspinous muscles of the thoracic and lumbar spine.   5/5 strength and sensation intact bilateral lower extremities  Patient observed to be ambulatory with a steady gait.  Skin:    General: Skin is warm and dry.   Neurological:     General: No focal deficit present.     Mental Status: He is alert and oriented to person, place, and time.   Psychiatric:        Mood and Affect: Mood normal.        Behavior: Behavior normal.     (all labs ordered are listed, but only abnormal results are displayed) Labs Reviewed - No data to display  EKG: None  Radiology: CT Lumbar Spine Wo Contrast Result Date: 10/13/2023 CLINICAL DATA:  Low back pain, trauma EXAM: CT LUMBAR SPINE WITHOUT CONTRAST TECHNIQUE: Multidetector CT imaging of the lumbar spine was performed without intravenous contrast administration. Multiplanar CT image reconstructions were  also generated. RADIATION DOSE REDUCTION: This exam was performed according to the departmental dose-optimization program which includes automated exposure control, adjustment of the mA and/or kV according to patient size and/or use of iterative reconstruction technique. COMPARISON:  12/16/2022, 12/15/2022 FINDINGS: Segmentation: 5 lumbar type vertebrae. Alignment: Normal. Vertebrae: There are no acute displaced lumbar spine fractures. No destructive bony abnormalities. Paraspinal and other soft tissues: The paraspinal soft tissues are unremarkable. Visualized retroperitoneal structures are normal. Disc levels: Findings at individual levels are as follows: T12/L1, L1/L2: Unremarkable. L2/L3: Broad-based disc bulge with bilateral facet and ligamentum flavum hypertrophy. L3/L4: Circumferential disc bulge with bilateral facet hypertrophy. Right greater than left neural foraminal encroachment. L4/L5: Circumferential disc bulge with bilateral facet and ligamentum flavum hypertrophy. Mild symmetrical bilateral neural foraminal encroachment. Mild central canal stenosis. L5/S1: Mild circumferential disc bulge and bilateral facet hypertrophy. No significant compressive sequela. Reconstructed images demonstrate no additional findings. Assessment of the disc spaces and central canal limited without intrathecal contrast. IMPRESSION: 1. No acute lumbar spine fracture. 2. Multilevel lumbar degenerative changes, greatest at L3-4 and L4-5 as above. Electronically Signed   By: Ozell Daring M.D.   On: 10/13/2023 15:21   CT Thoracic Spine Wo Contrast Result Date: 10/13/2023 CLINICAL DATA:  Mid back pain, trauma EXAM: CT THORACIC SPINE WITHOUT CONTRAST TECHNIQUE: Multidetector CT images of the thoracic were obtained using the standard protocol without intravenous contrast. RADIATION DOSE REDUCTION: This exam was performed according to the departmental dose-optimization program which includes automated exposure control, adjustment  of the mA and/or kV according to patient size and/or use of iterative reconstruction technique. COMPARISON:  12/15/2022, 10/27/2022 FINDINGS: Alignment: Alignment is grossly anatomic. Vertebrae: Postsurgical changes from laminectomies at T9 and T10. There are no acute or destructive bony abnormalities. Paraspinal and other soft tissues: The paraspinal soft tissues are unremarkable. Visualized portions of the lungs are clear. Disc levels: Assessment of the central canal neural foramina is limited without intrathecal contrast. There is continued diffuse facet hypertrophy. Mild diffuse disc space narrowing. There is continued bony encroachment at the T8-9 level. The bony encroachment at T9-10 and T10-11 is improved after interval laminectomy. There is continued multilevel neural foraminal encroachment, most pronounced on the right at T3-4 and symmetrically from T8-9 through T10-11. Reconstructed images demonstrate no additional findings. IMPRESSION: 1. Interval decompressive laminectomy at T9 and T10. 2. No acute bony abnormalities. 3. Extensive multilevel thoracic facet hypertrophic changes as above. Electronically Signed   By: Ozell Daring M.D.   On: 10/13/2023 15:17     Procedures   Medications Ordered in the ED - No data to display  Medical Decision Making  Patient presents today with complaints of back pain after mechanical fall x 2 days ago.  He is afebrile, nontoxic-appearing, and in no acute distress reassuring vital signs.  Physical exam reveals no tenderness to palpation of the cervical, thoracic, or lumbar spine.  No step-offs, lesions, deformity, or overlying skin changes.  Does have mild paraspinous muscle tightness and tenderness bilaterally around of the thoracic and lumbar spine area.  He has good strength and sensation in his bilateral lower extremities.  He was observed to be ambulatory with a steady gait.  CT imaging of the thoracic and lumbar spine  ordered by triage staff prior to my evaluation and has resulted and reveals  CT T spine: 1. Interval decompressive laminectomy at T9 and T10. 2. No acute bony abnormalities. 3. Extensive multilevel thoracic facet hypertrophic changes as above.   CT L spine: 1. No acute lumbar spine fracture. 2. Multilevel lumbar degenerative changes, greatest at L3-4 and L4-5 as above.   I have personally reviewed and interpreted this imaging and agree with radiology interpretation  Patient with no neurological deficits and normal neuro exam.  No loss of bowel or bladder control.  No concern for cauda equina.  No fever, night sweats, weight loss, h/o cancer, IVDU.  RICE protocol and pain medicine indicated and discussed with patient.  Will give prescription for Robaxin , NSAID use recommended and discussed.  Advised not to drive or operate heavy machinery while taking Robaxin .  Lifting precautions given as well. Evaluation and diagnostic testing in the emergency department does not suggest an emergent condition requiring admission or immediate intervention beyond what has been performed at this time. Plan for discharge with close PCP follow-up.  Patient is understanding and amenable with plan, educated on red flag symptoms that would prompt immediate return.  Patient discharged in stable condition.  Final diagnoses:  Fall, initial encounter  Acute bilateral back pain, unspecified back location    ED Discharge Orders          Ordered    methocarbamol  (ROBAXIN ) 500 MG tablet  2 times daily        10/13/23 2119          An After Visit Summary was printed and given to the patient.      Nora Lauraine DELENA DEVONNA 10/13/23 2120    Franklyn Sid SAILOR, MD 10/13/23 2141

## 2023-10-13 NOTE — ED Triage Notes (Signed)
 Patient reports had some isssues with back a few years ago and two days ago fell fast first and his back has been hurting since.

## 2023-10-13 NOTE — Discharge Instructions (Addendum)
 Your back pain is most likely due to a muscular strain.  There is been a lot of research on back pain, unfortunately the only thing that seems to really help is Tylenol  and ibuprofen .  Relative rest is also important to not lift greater than 10 pounds bending or twisting at the waist.  Please follow-up with your family physician.  The other thing that really seems to benefit patients is physical therapy which your doctor may send you for.  Please return to the emergency department for new numbness or weakness to your arms or legs. Difficulty with urinating or urinating or pooping on yourself.  Also if you cannot feel toilet paper when you wipe or get a fever.   Take 4 over the counter ibuprofen  tablets 3 times a day or 2 over-the-counter naproxen  tablets twice a day for pain. Also take tylenol  1000mg (2 extra strength) four times a day.   Additionally, I have given you a prescription for Robaxin  which is a muscle relaxer you can take as prescribed as needed.  Do not drive or operate heavy machinery while taking this medication.  Return if development of any new or worsening symptoms
